# Patient Record
Sex: Female | Born: 1945
Health system: Southern US, Community
[De-identification: ages and names within clinical notes are randomized; demographics above are authoritative.]

## PROBLEM LIST (undated history)

## (undated) DIAGNOSIS — I10 Essential (primary) hypertension: Secondary | ICD-10-CM

## (undated) DIAGNOSIS — G4730 Sleep apnea, unspecified: Secondary | ICD-10-CM

## (undated) DIAGNOSIS — K219 Gastro-esophageal reflux disease without esophagitis: Secondary | ICD-10-CM

## (undated) DIAGNOSIS — T7840XA Allergy, unspecified, initial encounter: Secondary | ICD-10-CM

## (undated) DIAGNOSIS — M1990 Unspecified osteoarthritis, unspecified site: Secondary | ICD-10-CM

## (undated) HISTORY — DX: Unspecified osteoarthritis, unspecified site: M19.90

## (undated) HISTORY — PX: CHOLECYSTECTOMY: SHX55

## (undated) HISTORY — PX: OTHER SURGICAL HISTORY: SHX169

## (undated) HISTORY — DX: Sleep apnea, unspecified: G47.30

## (undated) HISTORY — DX: Gastro-esophageal reflux disease without esophagitis: K21.9

## (undated) HISTORY — DX: Allergy, unspecified, initial encounter: T78.40XA

## (undated) HISTORY — DX: Essential (primary) hypertension: I10

## (undated) HISTORY — PX: ABDOMINAL HYSTERECTOMY: SHX81

---

## 1998-02-27 ENCOUNTER — Ambulatory Visit (HOSPITAL_COMMUNITY): Admission: RE | Admit: 1998-02-27 | Discharge: 1998-02-27 | Payer: Self-pay

## 1998-03-02 ENCOUNTER — Ambulatory Visit (HOSPITAL_COMMUNITY): Admission: RE | Admit: 1998-03-02 | Discharge: 1998-03-02 | Payer: Self-pay | Admitting: Family Medicine

## 1999-02-28 ENCOUNTER — Ambulatory Visit (HOSPITAL_COMMUNITY): Admission: RE | Admit: 1999-02-28 | Discharge: 1999-02-28 | Payer: Self-pay

## 1999-03-01 ENCOUNTER — Encounter: Payer: Self-pay | Admitting: Internal Medicine

## 2003-09-09 ENCOUNTER — Inpatient Hospital Stay (HOSPITAL_COMMUNITY): Admission: EM | Admit: 2003-09-09 | Discharge: 2003-09-12 | Payer: Self-pay | Admitting: Emergency Medicine

## 2003-09-09 ENCOUNTER — Encounter (INDEPENDENT_AMBULATORY_CARE_PROVIDER_SITE_OTHER): Payer: Self-pay | Admitting: Specialist

## 2005-06-11 ENCOUNTER — Other Ambulatory Visit: Admission: RE | Admit: 2005-06-11 | Discharge: 2005-06-11 | Payer: Self-pay | Admitting: Gynecology

## 2005-07-31 ENCOUNTER — Encounter (INDEPENDENT_AMBULATORY_CARE_PROVIDER_SITE_OTHER): Payer: Self-pay | Admitting: Specialist

## 2005-08-01 ENCOUNTER — Inpatient Hospital Stay (HOSPITAL_COMMUNITY): Admission: RE | Admit: 2005-08-01 | Discharge: 2005-08-02 | Payer: Self-pay | Admitting: *Deleted

## 2005-12-18 ENCOUNTER — Ambulatory Visit (HOSPITAL_COMMUNITY): Admission: RE | Admit: 2005-12-18 | Discharge: 2005-12-18 | Payer: Self-pay | Admitting: Family Medicine

## 2006-09-07 ENCOUNTER — Other Ambulatory Visit: Admission: RE | Admit: 2006-09-07 | Discharge: 2006-09-07 | Payer: Self-pay | Admitting: *Deleted

## 2007-04-12 ENCOUNTER — Ambulatory Visit: Payer: Self-pay | Admitting: Cardiology

## 2007-04-12 ENCOUNTER — Emergency Department (HOSPITAL_COMMUNITY): Admission: EM | Admit: 2007-04-12 | Discharge: 2007-04-12 | Payer: Self-pay | Admitting: Emergency Medicine

## 2008-02-24 ENCOUNTER — Other Ambulatory Visit: Admission: RE | Admit: 2008-02-24 | Discharge: 2008-02-24 | Payer: Self-pay | Admitting: Gynecology

## 2010-06-28 ENCOUNTER — Ambulatory Visit (HOSPITAL_COMMUNITY): Admission: RE | Admit: 2010-06-28 | Discharge: 2010-06-28 | Payer: Self-pay | Admitting: Emergency Medicine

## 2011-01-07 NOTE — Consult Note (Signed)
NAMESAGE, HAMMILL NO.:  [id number]   MEDICAL RECORD NO.:  [id number]          PATIENT TYPE:  EMS   LOCATION:  MAJO                         FACILITY:  [facility]   PHYSICIAN:  Rollene Rotunda, MD, FACCDATE OF BIRTH:  August 24, 1946   DATE OF CONSULTATION:  04/12/2007  DATE OF DISCHARGE:                                 CONSULTATION   SUMMARY OF HISTORY:  Miranda [name] is a 65 year old African American female  who is referred to [facility] Emergency Room for cardiac evaluation.  This past Thursday at approximately 1:30 a.m. during the night she  rolled over on her left arm and was awakened with left upper anterior  sharp twinges in her upper left chest and her shoulder.  She sat up and  tried to relax.  The discomfort lasted less than 5 minutes. She did not  have any associated shortness of breath, nausea, vomiting, diaphoresis.  She felt fine throughout Friday and Saturday.  On Thursday, the initial  discomfort was 5 to 6 on scale of 0 to 10.  Sunday morning when she woke  up around 7:00 a.m. she noticed that she had a hot sweat.  This was  proceeded by a sharp twinge in her left shoulder and left arm that she  gave a 3 on a scale of 0 to 10.  This occurred while she was sitting on  the bed and it lasted less than 1 minute.  She proceeded to have  breakfast, shower and dress for church.  She did not have any further  occurrences but due to the discomfort she decided to go to her primary  care physician.  She was noted to be hypertensive.  She was prescribed  Toprol and sublingual nitroglycerin and was told that she would follow  up with a cardiologist.  Apparently a physician from [address] spoke  with Dr. Diona Browner this morning and Dr. Diona Browner referred the patient to  the emergency room.  Miranda [name] has not had any subsequent occurrences  since yesterday morning. She denies prior episodes or known injury.   PAST MEDICAL HISTORY:  No known drug allergies.   MEDICATIONS:  1.  Lisinopril/hydrochlorothiazide 20/25 mg daily.  2. Lisinopril 20 mg every night.  3. She was recently prescribed metoprolol 50 mg daily and      nitroglycerin 0.4 as needed on April 11, 2007.   Her history is notable for hypertension for at least 6 years.  At home,  her blood pressure ranges from the 130s to the 160s over 50s to 80s.  She has had a total abdominal hysterectomy in December 2006 secondary to  adenocarcinoma.  Lap/cholecystectomy in 2005 and negative breast biopsy  in 1999.  She specifically denied any diabetes, CVA, myocardial  infarction, COPD, bleeding dyscrasias, hyperlipidemia or thyroid  discomfort.   SOCIAL HISTORY:  She resides with her husband in [place]. She has 2  children, 3 grandchildren.  She is a Conservation officer, nature with [employer] and works part-time at [employer]  She denies any history of  tobacco, alcohol, drugs, herbal medications,  specific diet or exercise  program.   FAMILY HISTORY:  Mother deceased at age 50 with a cerebral aneurysm.  Her father died at 56 of old age and history of Alzheimer's.  She had 1  brother that was killed.  She has 3 brothers and sisters living.  One  sister has had a nervous breakdown.   REVIEW OF SYSTEMS:  In addition to the above is notable for sweats,  sinus problems, glasses, chronic dyspnea on exertion since her lap/chole  in 2005, pedal edema which decreases at night, postmenopausal, nocturia,  numbness in her left index finger and rare GERD.   PHYSICAL EXAMINATION:  GENERAL:  Well-developed, well-nourished,  pleasant, obese African American female in no apparent distress.  Temperature is 98.3, blood pressure 135/51, pulse 60 and regular,  respirations 60 and regular, 97% sat on room air.  HEENT:  Unremarkable.  NECK:  Supple without thyromegaly, adenopathy, JVD or carotid bruits.  CHEST:  Symmetrical excursion.  LUNGS:  Clear to auscultation without rales, rhonchi or wheezing.  HEART:  PMI is not  displaced.  Regular rate and rhythm.  I do not  appreciate any murmurs, rubs, clicks or gallops.  All pulses are  symmetrical and intact.  I did not appreciate abdominal or femoral  bruits.  SKIN INTEGUMENT:  Intact.  ABDOMEN:  Obese, bowel sounds present without organomegaly, masses or  tenderness.  EXTREMITIES:  Negative cyanosis, clubbing or edema.  However, she does  have reproducible chest discomfort with range of motion exercises of her  left shoulder and she is slightly tender to the touch in her upper left  anterior chest just below the clavicle.  Otherwise musculoskeletal and  neuro are unremarkable.   Chest x-ray:  No active disease.   EKG:  Shows sinus bradycardia with a ventricular rate of 59, early R-  wave nonspecific ST-T wave changes.  Normal interval. No change from EKG  dated December 2006.   H&H is 12.1 and 37.1, normal indices, platelets 293, WBC 6.3, PTT 33, PT  13.4, CK total MB was 62 and 0.8, troponin 0.1.   IMPRESSION:  1. Left shoulder discomfort reproducible on exam.  No evidence of      cardiac ischemic at this time.  2. Hypertension.  3. Obesity.   DISPOSITION:  Dr. Antoine Poche reviewed the patient's history, spoke with  and examined the patient and agrees with the above.  He did not feel  that her discomfort is cardiac related.  However, given her risk factors  of obesity, postmenopausal and hypertension we will arrange an exercise  treadmill in our office.  This cannot be scheduled at this time and the  office will call her with the appointment.  We have asked her to  continue her metoprolol, however, asked her not to take this on the  morning of the exercise stress test.  Continue a blood pressure diary  and to follow up with her primary care physician.  We will have her  continue the metoprolol for her high blood pressure, however, this will  be held on the morning of the stress test.      Joellyn Rued, PA-C      Rollene Rotunda, MD, [facility]   Electronically Signed    EW/MEDQ  D:  04/12/2007  T:  04/12/2007  Job:  [id number]   cc:   Derenda Mis

## 2011-01-10 NOTE — H&P (Signed)
Miranda [name], WHELESS NO.:  [id number]   MEDICAL RECORD NO.:  [id number]          PATIENT TYPE:   LOCATION:                                 FACILITY:   PHYSICIAN:  Almedia Balls. Fore, [name]        DATE OF BIRTH:   DATE OF ADMISSION:  07/31/2005  DATE OF DISCHARGE:                                HISTORY & PHYSICAL   DATE OF ADMISSION:  July 31, 2005   CHIEF COMPLAINT:  Cancer of the uterus.   HISTORY:  The patient is a 65 year old gravida 2 para 2 whose last menstrual  period was many years ago. She had had some bleeding recently and underwent  hysteroscopy D&C with findings of extensive complex atypical hyperplasia,  endometrial polyps, and focus of well-differentiated endometrioid carcinoma.  Endocervical curettage was negative. She is admitted at this time for  TAH/BSO. She has been fully counseled as to the nature of the procedure and  the risks involved to include risks of anesthesia; injury to bowel, bladder,  blood vessels, ureters; postoperative hemorrhage; infection; recuperation.  She fully understands all these considerations and wishes to proceed on  July 31, 2005.   PAST MEDICAL HISTORY:  Includes breast biopsy in 1999 which was normal.  Gallbladder removal in January 2005. She takes furosemide daily, but has  stopped this recently. She is allergic to no medications.   FAMILY HISTORY:  Include sister with heart disease and brother who has had a  stroke.   REVIEW OF SYSTEMS:  HEENT: Occasional headaches. CARDIORESPIRATORY:  History  of hypertension. GASTROINTESTINAL:  Gallstones. GENITOURINARY:  As in  present illness. NEUROMUSCULAR:  Negative.   PHYSICAL EXAMINATION:  VITAL SIGNS:  Height 5 feet one-fourth inch, weight  240 pounds, blood pressure 150/88, pulse 80, respirations 18.  GENERAL:  Well-developed black female in no acute distress.  HEENT:  Within normal limits.  NECK:  Supple without masses, adenopathy or bruits.  HEART:  Regular  rate and rhythm without murmurs.  LUNGS:  Clear to P&A.  BREASTS:  Examined sitting and lying without mass.  ABDOMEN:  Flat and soft without masses, increased panniculus.  PELVIC:  External genitalia, Bartholin, urethra, and Skene's glands within  normal limits. Cervix is slightly inflamed. Uterus is mid posterior,  approximately 6-[redacted] weeks gestational size, nontender. Adnexa reveal no  palpable mass or tenderness. Rectovaginal exam is confirmatory.  EXTREMITIES:  Within normal limits.  CENTRAL NERVOUS SYSTEM: Grossly intact.  SKIN:  Without suspicious lesions.   IMPRESSION:  Adenocarcinoma of the endometrium, well differentiated,  probable stage I.   DISPOSITION:  As noted above. Of note is that a Pap smear was normal in  October 2006.           ______________________________  Almedia Balls. Randell Patient, [name]     SRF/MEDQ  D:  07/23/2005  T:  07/23/2005  Job:  [id number]

## 2011-01-10 NOTE — Discharge Summary (Signed)
NAMEJAELAH, Miranda [name] NO.:  [id number]   MEDICAL RECORD NO.:  [id number]          PATIENT TYPE:  INP   LOCATION:  1614                         [facility]ACILITY:  [facility]   PHYSICIAN:  Almedia Balls. Fore, [name]   DATE OF BIRTH:  1945-12-14   DATE OF ADMISSION:  07/31/2005  DATE OF DISCHARGE:  08/02/2005                                 DISCHARGE SUMMARY   HISTORY:  The patient is a 65 year old with grade 1, probably stage 1  adenocarcinoma of the endometrium for hysterectomy and bilateral salpingo-  oophorectomy. The remainder of her history and physical are as previously  dictated.   LABORATORY DATA:  Includes preoperative hemoglobin of 12.8. BMET panel  normal except for glucose slightly elevated at 113. Electrocautery showed  nonspecific T-wave abnormality and possible ventricular hypertrophy with a  normal sinus rhythm. Chest x-ray had been normal recently.   HOSPITAL COURSE:  The patient was taken to the operating room on July 31, 2005 at which time abdominal hysterectomy and bilateral salpingo-  oophorectomy, over sewing of superficial small intestinal burn were  performed. The patient did well postoperatively. Diet and ambulation were  progressed over several days postoperatively. On the first postoperative  day, she was experiencing some nausea and vomiting, but this gradually  cleared. On the morning of December 9, she was afebrile, passing flatus, and  experiencing no nausea. Bowel sounds were active at this point, and it was  felt that she could be discharged at this time.   FINAL DIAGNOSIS:  Adenocarcinoma of the endometrium, grade 1, probable stage  1.   OPERATION:  Abdominal hysterectomy, bilateral salpingo-oophorectomy, over  sewing of superficial burn of small intestine. Pathology report unavailable  at the time of dictation.   DISPOSITION:  Discharged home to return to the office in two weeks for  followup. She was instructed to gradually progress her  activities over  several weeks and to limit lifting and driving for two weeks. She was fully  ambulatory, on a regular diet and in good condition at the time of  discharge. She was given prescriptions for Percocet 10/325 #30, doxycycline  100 mg #12 to be taken 1 b.i.d. and Reglan 10 mg generic #30 to be taken 1  t.i.d. She will call for any problems.           ______________________________  Almedia Balls Miranda [name] Patient, [name]     SRF/MEDQ  D:  08/02/2005  T:  08/02/2005  Job:  [id number]

## 2011-01-10 NOTE — Op Note (Signed)
NAMEMARIXA, Miranda [name] NO.:  [id number]   MEDICAL RECORD NO.:  [id number]          PATIENT TYPE:  AMB   LOCATION:  DAY                          FACILITY:  [facility]   PHYSICIAN:  Almedia Balls. Fore, [name]   DATE OF BIRTH:  03/18/1946   DATE OF PROCEDURE:  07/31/2005  DATE OF DISCHARGE:                                 OPERATIVE REPORT   PREOPERATIVE DIAGNOSIS:  Grade 1 stage I adenocarcinoma of the endometrium.   POSTOPERATIVE DIAGNOSIS:  Grade 1 stage I adenocarcinoma of the endometrium,  pending pathology.   OPERATION:  TAH-BSO.   ANESTHESIA:  General orotracheal.   OPERATOR:  Dr. Randell Patient   FIRST ASSISTANT:  Dr. Laureen Ochs   INDICATIONS FOR SURGERY:  The patient is a 65 year old with the above-noted  problem for TAH-BSO.  She has been fully counseled as to the nature of the  procedure and the risks involved to include risks of anesthesia, injury to  bowel, bladder blood vessels, ureters, postoperative hemorrhage, infection,  recuperation.  She fully understands all these considerations and wishes to  proceed on 31 July 2005 and has signed informed consent to proceed on  this date as well.   OPERATIVE FINDINGS:  On entry into the peritoneal cavity, exploration of the  upper abdomen revealed the lower liver edge, gallbladder, spleen, kidneys,  periaortic areas to be normal to palpation.  Appendiceal area was normal to  visualization.  In the pelvis, the uterus was mid posterior, normal size,  shape and contour.  Both ovaries were atrophic and without signs of any  peritoneal lesions.   PROCEDURE:  With the patient under general anesthesia, prepared and draped  in the usual sterile fashion, with a Foley catheter in the bladder, a lower  abdominal transverse incision was made and carried into the peritoneal  cavity without difficulty.  At the lower portion of the incision, the Bovie  electrocoagulation unit was then used for hemostasis, and the patient had  protrusion  of a loop of bowel which sustained a very minimal burn from the  electrocoagulation unit.  This was observed for a short period of time, and  it was noted that there was no hemorrhage or significant change in this  area.  It was felt that this could be observed and sutured later because it  was a serosa.  A self-retaining retractor was placed, and the bowel was  packed off.  Kelly clamps were used to clamp the utero-ovarian anastomoses,  tubes, and round ligaments bilaterally for traction and hemostasis.  The  round ligaments bilaterally were transected using Bovie electrocoagulation  with development of bladder flap anteriorly and entry into the  retroperitoneal space.  The infundibulopelvic ligaments bilaterally were  identified, isolated, clamped, cut, and doubly ligated well above the  ureters.  Uterine vessels bilaterally were then skeletonized, clamped, cut,  and suture ligated with 1 chromic catgut.  Cardinal ligaments and  uterosacral ligament areas were then successively clamped with Heaney clamps  bilaterally, cut free, and suture ligated with 1 chromic catgut.  It was  then possible to remove  the uterus, ovaries, and cervix as a single specimen  by utilizing Bovie electrocoagulation at the vaginal cuff.  Vaginal cuff was  rendered hemostatic and reapproximated with interrupted sutures of 1 chromic  catgut.  The area was observed for hemostasis, and after noting that all  hemostasis was maintained and that all areas were normal, the area in the  bowel which had been injured previously by the Bovie electrocoagulation unit  was isolated and sutured with a horizontal mattress suture through the  serosa which provided good closure.  This area appeared normal for the most  part, but there was evidence of some slight burn.  During the search for  this area on the bowel, the bowel mesenteric vessel was lacerated; this was  suture ligated with interrupted suture of 3-0 silk as was the area  on the  bowel.  This provided hemostasis in the mesentery.  At this point with good  hemostasis throughout and correct sponge and instrument count, the  peritoneum was closed with a continuous suture of 0 Vicryl.  Fascia was  closed with two sutures of 0 Vicryl which were brought from the lateral  aspects of the incision and tied separately in the midline.  The  subcutaneous fat was reapproximated with interrupted horizontal mattress  sutures of 0 Vicryl.  Skin was closed with a subcuticular suture of 3-0  plain catgut.  Estimated blood loss 200 mL.  The patient was taken to the  recovery room in good condition with clear urine in the Foley catheter  tubing.  She will be placed on 23-hour observation following surgery.           ______________________________  Almedia Balls Randell Patient, [name]     SRF/MEDQ  D:  07/31/2005  T:  07/31/2005  Job:  [id number]   cc:   Leona Singleton, [name]  Fax: [phone]

## 2011-01-10 NOTE — Discharge Summary (Signed)
NAMEAVONNA, [name] NO.:  [id number]   MEDICAL RECORD NO.:  [id number]          PATIENT TYPE:  INP   LOCATION:  1614                         FACILITY:  [facility]   PHYSICIAN:  Almedia Balls. Fore, [name]   DATE OF BIRTH:  Oct 28, 1945   DATE OF ADMISSION:  07/31/2005  DATE OF DISCHARGE:  08/02/2005                                 DISCHARGE SUMMARY   ADDENDUM:  The patient was informed of the superficial burn to her small  intestine and to the fact that it required a small suture to reinforce this  area.  She fully understands this situation and will be observant of any  problem with GI function.           ______________________________  Almedia Balls Randell Patient, [name]     SRF/MEDQ  D:  08/02/2005  T:  08/02/2005  Job:  [id number]

## 2011-01-10 NOTE — Op Note (Signed)
Miranda [name], Miranda [name]                            ACCOUNT NO.:  [id number]   MEDICAL RECORD NO.:  [id number]                   PATIENT TYPE:  INP   LOCATION:  6045                                 FACILITY:  [facility]   PHYSICIAN:  [name], [name]                DATE OF BIRTH:  1946/06/28   DATE OF PROCEDURE:  09/10/2003  DATE OF DISCHARGE:                                 OPERATIVE REPORT   PREOPERATIVE DIAGNOSES:  1. Subacute cholecystitis.  2. Cholelithiasis, rule out choledocholithiasis.   POSTOPERATIVE DIAGNOSES:  1. Subacute cholecystitis.  2. Cholelithiasis.   PROCEDURE:  Laparoscopic cholecystectomy.   SURGEON:  [name], [name]   ASSISTANT:  Lebron Conners, [name]   ANESTHESIA:  General per Dr. Shireen Quan.   ESTIMATED BLOOD LOSS:  Minimal.   PREPARATION:  Betadine.   COMPLICATIONS:  None.   DRAINS:  #19 [place] Blake drain to right upper quadrant, placed to bulb  suction.   INDICATIONS:  The patient is a 65 year old black female, presented to the  emergency department with episode of severe right upper quadrant abdominal  pain, radiating to the back.  This was her third episode in 6 weeks.  The  patient was relieved with narcotic analgesics.  Ultrasound demonstrated  multiple gallstones and probable common bile duct stone.  The patient was  seen and evaluated by gastroenterology.  She was admitted on the  gastroenterology service.  General surgery was consulted.  Liver function  tests remain normal, and pain resolved.  Therefore, the patient was prepared  for the operating room.   DESCRIPTION OF PROCEDURE:  The procedure is done in OR #1 at the [facility].  The patient is brought to the operating room, placed in  a supine position on the operating room table.  Following administration of  general anesthesia, the patient is prepped and draped in the usual strict  aseptic fashion.  After ascertaining that an adequate level of anesthesia  had  been obtained, an infraumbilical incision is made with a #15 blade.  Dissection is carried down through the subcutaneous tissues.  Fascia is  incised in the midline, and the peritoneal cavity is entered.  A 0 Vicryl  pursestring suture is placed in the fascia.  An Hasson cannula is introduced  under direct vision and secured with the pursestring suture.  The abdomen is  insufflated with carbon dioxide.  Laparoscope is introduced under direct  vision and the abdomen explored.  There is a tense, distended gallbladder in  the right upper quadrant.  There are omental adhesions adherent to the  undersurface of the gallbladder.  Operative ports are placed along the right  costal margin in the midline, midclavicular line, and anterior axillary  line.  Fundus of the gallbladder is punctured with an aspirating trocar, and  clear bile is aspirated from the gallbladder.  Gallbladder is then grasped  and retracted cephalad.  Omental adhesions are taken down gently with blunt  dissection and hemostasis obtained with the electrocautery.  Dissection is  carried down to the neck of the gallbladder.  Gallbladder is quite large and  quite long.  The neck of the gallbladder is folded at approximately a 90-  degree angle into the porta hepatis.  The infundibulum of the gallbladder is  quite extensive.  There are adherent lymph nodes which are moderately  enlarged due to inflammation.  There is edema in the tissues.  There is  induration in the tissues.  Remaining on the wall of the gallbladder, a  careful dissection is performed.  Structures are divided between medium  Ligaclips.  Dissection is carried down to the end of the infundibulum, but a  cystic duct is not truly identified.  There is no evidence of bile leak.  There is no evidence of common bile duct injury.  Vascular structures are  controlled with Ligaclips.  Gallbladder is carefully excised from the  gallbladder bed using the hook electrocautery for  hemostasis.  Again, no  obvious evidence of cystic duct is encountered.  Therefore, a cholangiogram  is not performed.  However, there is no evidence of bile leak or common bile  duct injury.  Gallbladder is excised from the gallbladder bed using the hook  electrocautery for hemostasis.  Gallbladder is placed into an EndoCatch bag  and withdrawn from the peritoneal cavity through the umbilical port.  Due to  failure to positively control the cystic duct, a decision is made to place a  19 [place] Blake drain beneath the liver.  This brought in and exited through  the lateral port on the right side of the abdominal wall.  Drain is placed  beneath the liver and near the porta hepatis.  The drain is secured to the  skin with a 3-0 nylon suture.  Pneumoperitoneum is released, and remaining  drains are removed after aspirating all fluid from the right upper quadrant.  Good hemostasis is noted at all port sites.  Port sites are anesthetized  with local anesthetic.  The 0 Vicryl pursestring suture is tied securely.  Wounds are closed with interrupted 4-0 Vicryl subcuticular sutures.  Wounds  are washed and dried, and Benzoin and Steri-Strips are applied.  Sterile  gauze dressings are applied.  Drain is placed to bulb suction.  The patient  is awakened from anesthesia and brought to the recovery room in stable  condition.  The patient tolerated the procedure well.                                               [name], [name]    TMG/MEDQ  D:  09/10/2003  T:  09/10/2003  Job:  [id number]   cc:   Fayrene Fearing L. Malon Kindle., [name]  [address]  [place]  [place] [address]  Fax: [phone]   [facility]

## 2011-01-10 NOTE — H&P (Signed)
Miranda [name], Miranda [name]                            ACCOUNT NO.:  [id number]   MEDICAL RECORD NO.:  [id number]                   PATIENT TYPE:  EMS   LOCATION:  ED                                   FACILITY:  [facility]   PHYSICIAN:  James L. Malon Kindle., [name]          DATE OF BIRTH:  1945-09-27   DATE OF ADMISSION:  09/09/2003  DATE OF DISCHARGE:                                HISTORY & PHYSICAL   REASON FOR ADMISSION:  Biliary colic with possible common bile duct stone.   HISTORY:  A pleasant 65 year old African-American female, who has had three  distinct attacks of probable biliary colic over the past 6 weeks or so.  Severe attack started this morning, had her doubled over, right upper  quadrant going through to her back.  She did come to the emergency room  after being seen at [facility].  The feeling was that she had biliary colic.  The patient notes that the pain was severe.  She was given some Stadol and  Phenergan, clinically feels much better now.  She had some right-sided  abdominal pain and right flank tenderness.  The patient had an ultrasound  done that showed a gallbladder full of stones with probable common bile duct  calcification seen on multiple pictures.  Her liver tests, however, were  completely normal with normal total bilirubin and alk phos, normal  transaminases.   CURRENT MEDICATIONS:  Maxzide.   ALLERGIES:  She has no drug allergies.   MEDICAL HISTORY:  1. She has a history of fluid retention treated with Maxzide.  2. Some mild hypertension.  3. Only previous surgery is a breast biopsy.   FAMILY HISTORY:  Noncontributory, is negative for gallstones.   PHYSICAL EXAMINATION:  VITAL SIGNS:  Temperature 97.6, blood pressure  134/71.  GENERAL:  An obese black female in no acute distress.  EYES:  Sclerae nonicteric.  NECK:  Supple, no lymphadenopathy.  LUNGS:  Clear.  HEART:  Regular rate and rhythm with murmurs or gallops.  ABDOMEN:  Soft, nontender,  nondistended with good bowel sounds.   ASSESSMENT:  Gallstones with probable common bile duct stone.  I have  reviewed the ultrasound with Dr. Audie Pinto.  It does really appear to be a  common duct stone.  It is a bit unusual why her liver tests are normal.  I  wonder if the severe attack of pain today was actually passage of the stone  into the common duct, and she has not had time to bump upper limits yet.   PLAN:  1. We will admit.  2. Give clear liquids.  3. Check liver tests in the morning and make a decision in conjunction with     the surgeons regarding whether to proceed with ERCP or laparoscopic     cholecystectomy first.  James L. Malon Kindle., [name]    Waldron Session  D:  09/09/2003  T:  09/09/2003  Job:  [id number]   cc:   [facility].   [name], [name]  [address] [place]  [place] [address]  Fax: [phone]

## 2011-01-10 NOTE — Discharge Summary (Signed)
Miranda [name], Miranda [name]                            ACCOUNT NO.:  [id number]   MEDICAL RECORD NO.:  [id number]                   PATIENT TYPE:  INP   LOCATION:  1610                                 FACILITY:  [facility]   PHYSICIAN:  [name], [name]                DATE OF BIRTH:  [birth year]   DATE OF ADMISSION:  09/09/2003  DATE OF DISCHARGE:  09/12/2003                                 DISCHARGE SUMMARY   REASON FOR ADMISSION:  Biliary colic, rule out common bile duct stone.   BRIEF HISTORY:  The patient is a 65 year old African-American female with  three distinct episodes of biliary colic over the past six weeks prior to  admission.  She was seen at [facility] and then referred to [facility]  emergency department.  She was seen by the emergency room physicians.  Ultrasound demonstrated gallbladder with gallstones and a probable common  bile duct stone.  The patient was seen by [name], [name], and  admitted.  She was seen in consultation by general surgery.  The patient's  liver function tests remained normal.  Therefore, she was prepared and taken  to the operating room on September 10, 2003, where she underwent laparoscopic  cholecystectomy for subacute cholecystitis and cholelithiasis.  Post  procedure she had a Jackson-Pratt drain remain in place.  She received  intravenous antibiotics.  Liver function tests were repeated.  The patient  was prepared for discharge home on the second postoperative day.   DISCHARGE PLANNING:  The patient was discharged home on September 12, 2003, in  good condition, tolerating a regular diet, and ambulating independently.  The patient will be seen back in my office at [facility] in  three to five days for a wound check and drain removal.   DISCHARGE MEDICATIONS:  Vicodin as needed for pain.   FINAL DIAGNOSIS:  Chronic cholecystitis, cholelithiasis.   CONDITION ON DISCHARGE:  Improved.     [name], [name]    TMG/MEDQ  D:  09/27/2003  T:  09/27/2003  Job:  [id number]   cc:   Fayrene Fearing L. Malon Kindle., [name]  [address]  [place]  [place] [address]  Fax: [phone]

## 2011-01-10 NOTE — Consult Note (Signed)
Miranda [name], Miranda [name]                            ACCOUNT NO.:  [id number]   MEDICAL RECORD NO.:  [id number]                   PATIENT TYPE:  INP   LOCATION:  1610                                 FACILITY:  [facility]   PHYSICIAN:  [name], [name]                DATE OF BIRTH:  1946-04-05   DATE OF CONSULTATION:  09/10/2003  DATE OF DISCHARGE:                                   CONSULTATION   REFERRING PHYSICIAN:  Llana Aliment. Randa [name], [name]   REASON FOR CONSULTATION:  Cholelithiasis, subacute cholecystitis, rule out  common bile duct stone.   HISTORY OF PRESENT ILLNESS:  The patient is a 65 year old black female  admitted from the emergency department at [facility] by  Fayrene Fearing Miranda [name], [name], on September 09, 2003, with abdominal pain.  The  patient has had three episodes of right upper quadrant abdominal pain over  the past six weeks.  This was the most severe.  It started early on the  morning of September 09, 2003.  It was right upper quadrant abdominal pain  radiating to the back.  She presented for evaluation at [facility].  She was  treated with Stadol and Phenergan with mild symptomatic relief.  She was  transferred by ambulance to [facility] for evaluation.  In the  emergency department, ultrasound of the abdomen was obtained, which showed  gallstones and probable common bile duct stones as well.  The patient was  seen by Dr. Randa [name] and admitted on the gastroenterology service.  General  surgery is now consulted.   PAST MEDICAL HISTORY:  Hypertension.   MEDICATIONS:  Maxzide.   ALLERGIES:  None known.   SOCIAL HISTORY:  [place]  She uses  [facility] as her primary care physician.   FAMILY HISTORY:  Noncontributory.   REVIEW OF SYSTEMS:  The 15-system review was without significant other  positive, except as noted above.  The patient denies any history of jaundice  of acholic stools.  Denies any history of  fevers or chills.  She had not had  any symptoms prior to the past six weeks.   PHYSICAL EXAMINATION:  GENERAL APPEARANCE:  A 66 year old, moderately obese,  black female on [address] [facility].  VITAL SIGNS:  Temperature 97.6 degrees, blood pressure 134/71, pulse 62, O2  saturation 98% on room air.  HEENT:  Normocephalic and atraumatic.  The sclerae are clear.  The  conjunctivae are clear.  Pupils equal, round, and reactive to light.  Dentition is fair.  Mucous membranes are moist.  Voice quality is normal.  NECK:  Supple without mass.  The thyroid is normal without nodularity.  There is no anterior or posterior cervical lymphadenopathy.  LUNGS:  Clear to auscultation bilaterally without rales or rhonchi.  CARDIAC:  Regular rate and rhythm without murmur.  ABDOMEN:  Soft without distention.  Bowel sounds are present.  There are no  surgical wounds.  There is no hepatosplenomegaly.  There are no palpable  masses.  There is only mild tenderness in the right upper quadrant to deep  palpation.  There is no sign of hernia.  EXTREMITIES:  Nontender without edema.  NEUROLOGIC:  The patient is alert and oriented without focal deficit.   LABORATORY STUDIES:  White blood cell count normal, hemoglobin normal.  Electrolytes normal.  Liver function tests within normal limits.   The EKG shows sinus bradycardia without acute change.   The chest x-ray shows no active disease.   IMPRESSION:  1. Subacute cholecystitis.  2. Cholelithiasis.  3. Rule out choledocholithiasis.   RECOMMENDATIONS:  1. To operating room for laparoscopic cholecystectomy with cholangiography     if possible.  2. Routine postoperative care.  3. ERCP if common bile duct stone is identified.   I discussed at length with Ms. Courser the indications for surgery.  Given that  her liver function tests remain normal from admission and on the morning  following admission, despite radiographic reports of  common bile duct  stones, I feel it is unlikely she has choledocholithiasis.  Also, the  patient has remained pain-free overnight.  I discussed with her the  indications for cholecystectomy.  We discussed laparoscopic versus open  technique.  We discussed the risks and benefits of the procedure.  She is  anxious to proceed.                                               [name], [name]    TMG/MEDQ  D:  09/10/2003  T:  09/10/2003  Job:  [id number]

## 2011-06-06 LAB — DIFFERENTIAL
Basophils Absolute: 0
Basophils Relative: 1
Lymphocytes Relative: 26
Neutro Abs: 4
Neutrophils Relative %: 63

## 2011-06-06 LAB — CBC
HCT: 37.1
MCHC: 32.7
Platelets: 293
RDW: 15.8 — ABNORMAL HIGH

## 2011-06-06 LAB — COMPREHENSIVE METABOLIC PANEL
Albumin: 3.4 — ABNORMAL LOW
Alkaline Phosphatase: 116
BUN: 11
Chloride: 104
Creatinine, Ser: 0.8
GFR calc non Af Amer: >60
Glucose, Bld: 96
Total Bilirubin: 0.8

## 2011-06-06 LAB — CK TOTAL AND CKMB (NOT AT ARMC): CK, MB: 0.8

## 2011-07-16 ENCOUNTER — Other Ambulatory Visit: Payer: Self-pay | Admitting: Family Medicine

## 2011-07-16 DIAGNOSIS — Z1231 Encounter for screening mammogram for malignant neoplasm of breast: Secondary | ICD-10-CM

## 2011-08-20 ENCOUNTER — Ambulatory Visit (HOSPITAL_COMMUNITY): Payer: Self-pay | Attending: Family Medicine

## 2011-12-04 ENCOUNTER — Ambulatory Visit (INDEPENDENT_AMBULATORY_CARE_PROVIDER_SITE_OTHER): Payer: BC Managed Care – PPO | Admitting: Emergency Medicine

## 2011-12-04 VITALS — BP 153/89 | HR 65 | Temp 98.0°F | Resp 18 | Ht 60.0 in | Wt 242.6 lb

## 2011-12-04 DIAGNOSIS — R42 Dizziness and giddiness: Secondary | ICD-10-CM

## 2011-12-04 DIAGNOSIS — D649 Anemia, unspecified: Secondary | ICD-10-CM

## 2011-12-04 DIAGNOSIS — M25519 Pain in unspecified shoulder: Secondary | ICD-10-CM

## 2011-12-04 LAB — POCT CBC
Hemoglobin: 11.9 g/dL — AB (ref 12.2–16.2)
Lymph, poc: 2.2 (ref 0.6–3.4)
MCH, POC: 24.6 pg — AB (ref 27–31.2)
MCHC: 32.2 g/dL (ref 31.8–35.4)
MID (cbc): 0.5 (ref 0–0.9)
MPV: 8.6 fL (ref 0–99.8)
POC Granulocyte: 5 (ref 2–6.9)
POC LYMPH PERCENT: 28.3 %L (ref 10–50)
POC MID %: 7.1 %M (ref 0–12)
Platelet Count, POC: 291 10*3/uL (ref 142–424)
RDW, POC: 16.1 %
WBC: 7.7 10*3/uL (ref 4.6–10.2)

## 2011-12-04 LAB — GLUCOSE, POCT (MANUAL RESULT ENTRY): POC Glucose: 106

## 2011-12-04 MED ORDER — MECLIZINE HCL 12.5 MG PO TABS: 32.0000 mg | ORAL_TABLET | Freq: Three times a day (TID) | ORAL | Status: DC | PRN

## 2011-12-04 NOTE — Progress Notes (Signed)
  Subjective:    Patient ID: Miranda [name], female    DOB: 31-Mar-1946, 66 y.o.   MRN: [id number]  HPI patient states that when she awakened yesterday morning she had the onset of what she felt like the room moving. She felt as if she were was falling into a deep hole. This was associated with some nausea. She has not had a headache speech disorder memory issues or any weakness in her arms or legs. She felt a little better through the day yesterday but then this morning awoke and felt similar symptoms although not as bad as they were yesterday.    Review of Systems patient has a history of high blood pressure. She is on medications for this. She has also had pain in the left posterior shoulder which extends over to the left pectoralis area.     Objective:   Physical Exam  Constitutional: She is oriented to person, place, and time. She appears well-developed.  HENT:  Head: Normocephalic.  Right Ear: External ear normal.  Left Ear: External ear normal.  Eyes: Pupils are equal, round, and reactive to light.       There is no nystagmus noted on lateral gaze  Cardiovascular: Normal rate.  Exam reveals gallop. Exam reveals no friction rub.   No murmur heard. Pulmonary/Chest: No respiratory distress. She has no wheezes. She has no rales. She exhibits no tenderness.  Abdominal: Soft. There is no tenderness.  Musculoskeletal:       There is tenderness over the left pectoralis muscle.  Neurological: She is alert and oriented to person, place, and time. She has normal reflexes. She displays normal reflexes. No cranial nerve deficit. She exhibits normal muscle tone. Coordination normal.  Skin: Skin is warm and dry.          Assessment & Plan:   Her symptoms are most consistent with inner ear. We'll check CBC, cmet,glucose. She has tenderness over the pectoralis muscle consistent with a muscle strain. Her hemoglobin is borderline low and microcytic however it has been low in the past he last had a  colonoscopy in 2002. We'll add iron studies to be sure we are not dealing with an iron deficiency anemia

## 2011-12-05 LAB — COMPREHENSIVE METABOLIC PANEL
ALT: 11 U/L (ref 0–35)
AST: 16 U/L (ref 0–37)
Albumin: 4 g/dL (ref 3.5–5.2)
Alkaline Phosphatase: 103 U/L (ref 39–117)
BUN: 15 mg/dL (ref 6–23)
CO2: 28 mEq/L (ref 19–32)
Calcium: 9.6 mg/dL (ref 8.4–10.5)
Chloride: 102 mEq/L (ref 96–112)
Creat: 0.95 mg/dL (ref 0.50–1.10)
Glucose, Bld: 100 mg/dL — ABNORMAL HIGH (ref 70–99)
Potassium: 4 mEq/L (ref 3.5–5.3)
Sodium: 140 mEq/L (ref 135–145)
Total Bilirubin: 0.4 mg/dL (ref 0.3–1.2)
Total Protein: 7.3 g/dL (ref 6.0–8.3)

## 2011-12-05 LAB — IBC PANEL
%SAT: 25 % (ref 20–55)
TIBC: 253 ug/dL (ref 250–470)
UIBC: 189 ug/dL (ref 125–400)

## 2011-12-05 LAB — IRON: Iron: 64 ug/dL (ref 42–145)

## 2012-01-17 ENCOUNTER — Ambulatory Visit (INDEPENDENT_AMBULATORY_CARE_PROVIDER_SITE_OTHER): Payer: BC Managed Care – PPO | Admitting: Family Medicine

## 2012-01-17 VITALS — BP 142/82 | HR 66 | Temp 97.8°F | Resp 16 | Ht 60.75 in | Wt 243.0 lb

## 2012-01-17 DIAGNOSIS — E669 Obesity, unspecified: Secondary | ICD-10-CM

## 2012-01-17 DIAGNOSIS — I10 Essential (primary) hypertension: Secondary | ICD-10-CM

## 2012-01-17 MED ORDER — ATENOLOL-CHLORTHALIDONE 50-25 MG PO TABS: 1.0000 | ORAL_TABLET | Freq: Every day | ORAL | Status: DC

## 2012-01-17 MED ORDER — LISINOPRIL 40 MG PO TABS: 40.0000 mg | ORAL_TABLET | Freq: Every day | ORAL | Status: DC

## 2012-01-17 NOTE — Progress Notes (Signed)
Subjective: 66 year old lady with history of high blood pressure. Takes her medications regularly. He is here for a refill of the medications. She has no major acute complaints. She works in a [employer]. She gets walking regularly for exercise. Her weight stays approximately the same, fluctuating 5 or 10 pounds one way or the other. She's tried eating last not succeeded in losing, stressed with her the importance of continued to cut back on reading taken to she does see the weight coming off. She does try to avoid excessive salt intake.  Denies any chest pain or breathing problems. No GI or GU complaints. She does get a little bit of swelling in her legs during hot weather, but it is not a constant problem. No more episodes of the room moving sensation that she had a couple of weeks ago when she saw Dr. Cleta Alberts.  Objective: Pleasant alert lady in no acute distress. Neck supple no carotid bruits. Chest clear. Heart regular without murmurs. No ankle edema. Filed Vitals:   01/17/12 1506  BP: 142/82  Pulse: 66  Temp: 97.8 F (36.6 C)  Resp: 16   Assessment: Hypertension Overweigh Intermittent edema  Plan: Refill

## 2012-01-17 NOTE — Patient Instructions (Signed)

## 2012-07-12 ENCOUNTER — Other Ambulatory Visit: Payer: Self-pay | Admitting: Family Medicine

## 2012-07-12 ENCOUNTER — Ambulatory Visit (INDEPENDENT_AMBULATORY_CARE_PROVIDER_SITE_OTHER): Payer: BC Managed Care – PPO | Admitting: Family Medicine

## 2012-07-12 VITALS — BP 132/86 | HR 62 | Temp 97.5°F | Resp 18 | Ht 60.5 in | Wt 242.0 lb

## 2012-07-12 DIAGNOSIS — I10 Essential (primary) hypertension: Secondary | ICD-10-CM

## 2012-07-12 DIAGNOSIS — M25519 Pain in unspecified shoulder: Secondary | ICD-10-CM

## 2012-07-12 DIAGNOSIS — M25512 Pain in left shoulder: Secondary | ICD-10-CM

## 2012-07-12 MED ORDER — ATENOLOL-CHLORTHALIDONE 50-25 MG PO TABS: 1.0000 | ORAL_TABLET | Freq: Every day | ORAL | Status: DC

## 2012-07-12 MED ORDER — LISINOPRIL 40 MG PO TABS: 40.0000 mg | ORAL_TABLET | Freq: Every day | ORAL | Status: DC

## 2012-07-12 MED ORDER — OXAPROZIN 600 MG PO TABS: ORAL_TABLET | ORAL | Status: DC

## 2012-07-12 NOTE — Patient Instructions (Signed)
Continue current blood pressure medicine  Take the medicine for the shoulder one twice daily with food. If it upsets your stomach stopped taking it. If you're not doing better over the next couple of weeks come back for further assessment.

## 2012-07-12 NOTE — Progress Notes (Signed)
Subjective: Patient's been having problems with pain in her left shoulder intermittently. Recently has been bad, hurting in the left upper back and anterior part of the shoulder. Knows of no injury. She works at a [employer]. She has not had any treatment for it. She also needs a refill of her blood pressure medicine. Has no problems with that.  Objective: Overweight Afro-American lady, pleasant. Chest clear. Heart regular without murmurs. She's very tender in the anterior aspect of the left shoulder the, a.c. joint region. Additionally she has tenderness just superior to and medial to the left scapula.  Assessment: Shoulder pain and strain Hypertension   Plan: Refill her blood pressure medicines Daypro 600 twice a day  If she's not doing better she is to return and might consider injection of the a.c. joint area. Will probably need x-rays.

## 2012-09-11 ENCOUNTER — Other Ambulatory Visit: Payer: Self-pay | Admitting: Family Medicine

## 2012-09-21 ENCOUNTER — Telehealth: Payer: Self-pay

## 2012-09-21 NOTE — Telephone Encounter (Signed)
[facility] CALLED TO SAY THEY NEED A CALL BACK TO SEE IF WE WOULD REFILL PT'S LISINOPRIL. PLEASE CALL [phone]

## 2012-09-21 NOTE — Telephone Encounter (Signed)
Patient's pharmacy calling from [facility] saying that someone from our office faxed a fax saying we do not accept rx refill requests through fax anymore? (I haven't heard anything about this) pharmacy wants to speak to someone about this: name is Miranda [name] number: [phone]. They want to get a verbal on the refill requests.

## 2012-09-22 NOTE — Telephone Encounter (Signed)
Pharmacy states that they never got rx from November.  Gave verbal.

## 2012-10-24 ENCOUNTER — Other Ambulatory Visit: Payer: Self-pay | Admitting: Family Medicine

## 2013-01-07 ENCOUNTER — Ambulatory Visit (INDEPENDENT_AMBULATORY_CARE_PROVIDER_SITE_OTHER): Payer: BC Managed Care – PPO | Admitting: Emergency Medicine

## 2013-01-07 VITALS — BP 110/60 | HR 65 | Temp 98.2°F | Resp 16 | Ht 61.0 in | Wt 243.4 lb

## 2013-01-07 DIAGNOSIS — Z760 Encounter for issue of repeat prescription: Secondary | ICD-10-CM

## 2013-01-07 DIAGNOSIS — Z1211 Encounter for screening for malignant neoplasm of colon: Secondary | ICD-10-CM

## 2013-01-07 DIAGNOSIS — D649 Anemia, unspecified: Secondary | ICD-10-CM

## 2013-01-07 DIAGNOSIS — I10 Essential (primary) hypertension: Secondary | ICD-10-CM

## 2013-01-07 DIAGNOSIS — E785 Hyperlipidemia, unspecified: Secondary | ICD-10-CM

## 2013-01-07 LAB — COMPREHENSIVE METABOLIC PANEL
Albumin: 3.8 g/dL (ref 3.5–5.2)
BUN: 21 mg/dL (ref 6–23)
Calcium: 9.9 mg/dL (ref 8.4–10.5)
Chloride: 103 mEq/L (ref 96–112)
Glucose, Bld: 99 mg/dL (ref 70–99)
Potassium: 4.6 mEq/L (ref 3.5–5.3)

## 2013-01-07 LAB — POCT CBC
Granulocyte percent: 65.8 %G (ref 37–80)
HCT, POC: 36 % — AB (ref 37.7–47.9)
MCH, POC: 24.3 pg — AB (ref 27–31.2)
MCV: 80.2 fL (ref 80–97)
POC LYMPH PERCENT: 28.1 %L (ref 10–50)
RBC: 4.49 M/uL (ref 4.04–5.48)
WBC: 6.1 10*3/uL (ref 4.6–10.2)

## 2013-01-07 LAB — LIPID PANEL
Cholesterol: 185 mg/dL (ref 0–200)
HDL: 58 mg/dL (ref >39)
Triglycerides: 84 mg/dL (ref <150)

## 2013-01-07 LAB — IRON: Iron: 66 ug/dL (ref 42–145)

## 2013-01-07 LAB — IBC PANEL: %SAT: 26 % (ref 20–55)

## 2013-01-07 MED ORDER — ATENOLOL-CHLORTHALIDONE 50-25 MG PO TABS: 1.0000 | ORAL_TABLET | Freq: Every day | ORAL | Status: DC

## 2013-01-07 MED ORDER — LISINOPRIL 40 MG PO TABS: 40.0000 mg | ORAL_TABLET | Freq: Every day | ORAL | Status: DC

## 2013-01-07 NOTE — Progress Notes (Signed)
  Subjective:    Patient ID: Miranda [name], female    DOB: [birth year], 67 y.o.   MRN: [id number]  HPI 66 year old female here for medication refills: ATENOLOL-CHLORTHALIDPNE 50/25mg  1 qd LISINOPRIL 40mg  1 qd  No lapse, taking on a regular basis 128/76 bp in room  Review of Systems     Objective:   Physical Exam HEENT exam negative neck is supple. Chest is clear to auscultation and percussion. Heart regular and no murmurs rubs or gallops and is obese. Extremities without edema  Results for orders placed in visit on 01/07/13  POCT CBC      Result Value Range   WBC 6.1  4.6 - 10.2 K/uL   Lymph, poc 1.7  0.6 - 3.4   POC LYMPH PERCENT 28.1  10 - 50 %L   MID (cbc) 0.4  0 - 0.9   POC MID % 6.1  0 - 12 %M   POC Granulocyte 4.0  2 - 6.9   Granulocyte percent 65.8  37 - 80 %G   RBC 4.49  4.04 - 5.48 M/uL   Hemoglobin 10.9 (*) 12.2 - 16.2 g/dL   HCT, POC 40.9 (*) 81.1 - 47.9 %   MCV 80.2  80 - 97 fL   MCH, POC 24.3 (*) 27 - 31.2 pg   MCHC 30.3 (*) 31.8 - 35.4 g/dL   RDW, POC 91.4     Platelet Count, POC 237  142 - 424 K/uL   MPV 8.7  0 - 99.8 fL  POCT GLYCOSYLATED HEMOGLOBIN (HGB A1C)      Result Value Range   Hemoglobin A1C 5.8          Assessment & Plan:  We'll go ahead and check iron studies. She will schedule her own mammogram. Referral made to GI for evaluation .

## 2013-01-11 ENCOUNTER — Other Ambulatory Visit: Payer: Self-pay | Admitting: Emergency Medicine

## 2013-01-11 DIAGNOSIS — Z1231 Encounter for screening mammogram for malignant neoplasm of breast: Secondary | ICD-10-CM

## 2013-01-12 ENCOUNTER — Ambulatory Visit (HOSPITAL_COMMUNITY)
Admission: RE | Admit: 2013-01-12 | Discharge: 2013-01-12 | Disposition: A | Discharge status: Home / Self Care | Payer: BC Managed Care – PPO | Source: Ambulatory Visit | Attending: Emergency Medicine | Admitting: Emergency Medicine

## 2013-01-12 DIAGNOSIS — Z1231 Encounter for screening mammogram for malignant neoplasm of breast: Secondary | ICD-10-CM | POA: Insufficient documentation

## 2013-01-12 LAB — IFOBT (OCCULT BLOOD): IFOBT: NEGATIVE

## 2013-01-14 ENCOUNTER — Encounter: Payer: Self-pay | Admitting: Internal Medicine

## 2013-01-19 ENCOUNTER — Encounter: Payer: Self-pay | Admitting: Internal Medicine

## 2013-03-09 ENCOUNTER — Ambulatory Visit (AMBULATORY_SURGERY_CENTER): Payer: Medicare Other | Admitting: *Deleted

## 2013-03-09 VITALS — Ht 60.5 in | Wt 241.4 lb

## 2013-03-09 DIAGNOSIS — Z1211 Encounter for screening for malignant neoplasm of colon: Secondary | ICD-10-CM

## 2013-03-09 MED ORDER — MOVIPREP 100 G PO SOLR: ORAL | Status: DC

## 2013-03-09 NOTE — Progress Notes (Signed)
No egg or soy allergy 

## 2013-03-23 ENCOUNTER — Encounter: Payer: Self-pay | Admitting: Internal Medicine

## 2013-03-23 ENCOUNTER — Ambulatory Visit (AMBULATORY_SURGERY_CENTER): Payer: Medicare Other | Admitting: Internal Medicine

## 2013-03-23 VITALS — BP 94/34 | HR 67 | Temp 96.4°F | Resp 18 | Ht 60.5 in | Wt 241.0 lb

## 2013-03-23 DIAGNOSIS — Z1211 Encounter for screening for malignant neoplasm of colon: Secondary | ICD-10-CM

## 2013-03-23 DIAGNOSIS — D126 Benign neoplasm of colon, unspecified: Secondary | ICD-10-CM

## 2013-03-23 MED ORDER — SODIUM CHLORIDE 0.9 % IV SOLN: 500.0000 mL | INTRAVENOUS | Status: DC

## 2013-03-23 NOTE — Patient Instructions (Addendum)

## 2013-03-23 NOTE — Progress Notes (Signed)
Patient did not experience any of the following events: a burn prior to discharge; a fall within the facility; wrong site/side/patient/procedure/implant event; or a hospital transfer or hospital admission upon discharge from the facility. (G8907) Patient did not have preoperative order for IV antibiotic SSI prophylaxis. (G8918)  

## 2013-03-23 NOTE — Op Note (Signed)
[facility] [address] [place], [address]   COLONOSCOPY PROCEDURE REPORT  PATIENT: Miranda [name], Miranda [name]  MR#: [id number] BIRTHDATE: Dec 29, 1945 , 67  yrs. old GENDER: Female ENDOSCOPIST: Hart Carwin, MD REFERRED XB:JYNWG Guest, [name] PROCEDURE DATE:  03/23/2013 PROCEDURE:   Colonoscopy with cold biopsy polypectomy First Screening Colonoscopy - Avg.  risk and is 50 yrs.  old or older - No.  Prior Negative Screening - Now for repeat screening. 10 or more years since last screening  History of Adenoma - Now for follow-up colonoscopy & has been > or = to 3 yrs.  N/A  Polyps Removed Today? Yes. ASA CLASS:   Class II INDICATIONS:Average risk patient for colon cancer and last colonoscopy 01/2003 was normal. MEDICATIONS: MAC sedation, administered by CRNA and propofol (Diprivan) 250mg  IV  DESCRIPTION OF PROCEDURE:   After the risks benefits and alternatives of the procedure were thoroughly explained, informed consent was obtained.  A digital rectal exam revealed no abnormalities of the rectum.   The LB [id number] and LB [id number]  endoscope was introduced through the anus and advanced to the cecum, which was identified by both the appendix and ileocecal valve. No adverse events experienced.   The quality of the prep was good, using MoviPrep  The instrument was then slowly withdrawn as the colon was fully examined.      COLON FINDINGS: A polypoid shaped sessile polyp ranging between 3-58mm in size was found in the ascending colon.  A polypectomy was performed with cold forceps.  The resection was complete and the polyp tissue was completely retrieved.   Mild diverticulosis was noted in the sigmoid colon.   Abnormal mucosa was found in the sigmoid colon.between 20 and 30 cm. The mucosa was recurrent and mildly nodular. It was spastic. Multiple biopsies were taken to rule out infiltrating process  Retroflexed views revealed no abnormalities. The time to  cecum=  .  Withdrawal time=  .  The scope was withdrawn and the procedure completed. COMPLICATIONS: There were no complications.  ENDOSCOPIC IMPRESSION: 1.   Sessile polyp ranging between 3-57mm in size was found in the ascending colon; polypectomy was performed with cold forceps 2.   Mild diverticulosis was noted in the sigmoid colon 3.   Abnormal mucosa was found in the sigmoid colon ,between 20 and 30 cm. Spasm and thick folds. Rule out infiltrating process. Status post biopsies  RECOMMENDATIONS: Await pathology results   eSigned:  Hart Carwin, MD 03/23/2013 9:47 AM   cc:   PATIENT NAME:  Tamiya, Colello MR#: [id number]

## 2013-03-23 NOTE — Progress Notes (Signed)
Called to room to assist during endoscopic procedure.  Patient ID and intended procedure confirmed with present staff. Received instructions for my participation in the procedure from the performing physician.  

## 2013-03-23 NOTE — Progress Notes (Signed)
Lidocaine-40mg IV prior to Propofol InductionPropofol given over incremental dosages 

## 2013-03-24 ENCOUNTER — Telehealth: Payer: Self-pay | Admitting: *Deleted

## 2013-03-24 NOTE — Telephone Encounter (Signed)
No answer, message left for the patient. 

## 2013-03-29 ENCOUNTER — Encounter: Payer: Self-pay | Admitting: Internal Medicine

## 2013-04-20 ENCOUNTER — Ambulatory Visit: Payer: Medicare Other

## 2013-04-20 ENCOUNTER — Ambulatory Visit (INDEPENDENT_AMBULATORY_CARE_PROVIDER_SITE_OTHER): Payer: Medicare Other | Admitting: Family Medicine

## 2013-04-20 DIAGNOSIS — M79609 Pain in unspecified limb: Secondary | ICD-10-CM

## 2013-04-20 DIAGNOSIS — R05 Cough: Secondary | ICD-10-CM

## 2013-04-20 DIAGNOSIS — J45909 Unspecified asthma, uncomplicated: Secondary | ICD-10-CM

## 2013-04-20 MED ORDER — BENZONATATE 100 MG PO CAPS: 100.0000 mg | ORAL_CAPSULE | Freq: Three times a day (TID) | ORAL | Status: DC | PRN

## 2013-04-20 MED ORDER — ALBUTEROL SULFATE HFA 108 (90 BASE) MCG/ACT IN AERS: 2.0000 | INHALATION_SPRAY | Freq: Four times a day (QID) | RESPIRATORY_TRACT | Status: DC | PRN

## 2013-04-20 MED ORDER — OXAPROZIN 600 MG PO TABS: ORAL_TABLET | ORAL | Status: DC

## 2013-04-20 MED ORDER — AZITHROMYCIN 250 MG PO TABS: ORAL_TABLET | ORAL | Status: DC

## 2013-04-20 NOTE — Patient Instructions (Signed)
Use the daypro as needed for foot pain.  Be sure to wear supportive shoes, and decrease your walking for a week or so.  If you continue to have foot pain please let me know  Use the antibiotic, inhaler and tessalon perles for your cough.  If your cough is not better in the next week or so please let me know.

## 2013-04-20 NOTE — Progress Notes (Signed)
[facility] [address] [place] [address] [phone]  Date:  04/20/2013   Name:  Miranda [name]   DOB:  11/10/1945   MRN:  [id number]  PCP:  Tally Due, MD    Chief Complaint: wheezing and cough and Foot Pain   History of Present Illness:  Miranda [name] is a 67 y.o. very pleasant female patient who presents with the following:  She is here today for 2 issues. She has noted pain in her right foot for about one week- in the ball of the foot. She has been wearing some new walking shoes with extra padding for support, but notes that she seems to have pain when this padding presses on the bottom of her foot.  A couple of days ago while walking she felt like "the top of my foot went numb."  This did not last or recur.   No known injury.  No unusual activity except she has been walking more over the last couple of weeks.    She also notes coughing and wheezing "off an on for awhile."  She has noted it since late June. Her cough is occasionally productive.   She cannot see any pattern to her wheezing- not worse with exercise, going outdoors, etc.  She has not wheezed in the past that she can recall.   No sneezing or runny nose.  Her eyes and ears do itch.   She has not tried any mediations for this. No fever.   No new allergen exposures that she is aware of.   She is not taking any allergy medications History of HTN and obesity Used daypro in the past for shoulder pain with success  Patient Active Problem List   Diagnosis Date Noted  . HTN (hypertension) 01/17/2012  . Obesity 01/17/2012    Past Medical History  Diagnosis Date  . Arthritis   . Hypertension   . Allergy     Past Surgical History  Procedure Laterality Date  . Gallbladder surgery    . Abdominal hysterectomy    . Cholecystectomy    . Breast biopsy      History  Substance Use Topics  . Smoking status: Never Smoker   . Smokeless tobacco: Never Used  . Alcohol Use: Yes   Comment: very seldom    Family History  Problem Relation Age of Onset  . Heart disease Sister   . Stroke Brother   . Colon cancer Neg Hx   . Esophageal cancer Neg Hx   . Rectal cancer Neg Hx   . Stomach cancer Neg Hx     No Known Allergies  Medication list has been reviewed and updated.  Current Outpatient Prescriptions on File Prior to Visit  Medication Sig Dispense Refill  . atenolol-chlorthalidone (TENORETIC) 50-25 MG per tablet Take 1 tablet by mouth daily.  30 tablet  11  . lisinopril (PRINIVIL,ZESTRIL) 40 MG tablet Take 1 tablet (40 mg total) by mouth daily.  30 tablet  11  . oxaprozin (DAYPRO) 600 MG tablet One twice daily for shoulder. Take with food.  30 tablet  1   No current facility-administered medications on file prior to visit.    Review of Systems:  As per HPI- otherwise negative.   Physical Examination: Filed Vitals:   04/20/13 1100  BP: 128/70  Pulse: 70  Temp: 98.4 F (36.9 C)  Resp: 17   Filed Vitals:   04/20/13 1100  Height: 5' (1.524 m)  Weight: 238 lb (107.956 kg)   Body mass index is 46.48 kg/(m^2). Ideal Body Weight: Weight in (lb) to have BMI = 25: 127.7  GEN: WDWN, NAD, Non-toxic, A & O x 3, obese, coughing in room sporadically HEENT: Atraumatic, Normocephalic. Neck supple. No masses, No LAD. Ears and Nose: No external deformity. CV: RRR, No M/G/R. No JVD. No thrill. No extra heart sounds. PULM: CTA B, no wheezes, crackles, rhonchi. No retractions. No resp. distress. No accessory muscle use. EXTR: No c/c/e NEURO Normal gait.  PSYCH: Normally interactive. Conversant. Not depressed or anxious appearing.  Calm demeanor.  Right foot:  No swelling, heat or redness.  Ankle negative.  Adequate arch.  Mild tenderness under 2nd MT head. No evidence of plantar fascitis   [facility] reading (PRIMARY) by  Dr. Patsy Lager. Negative right foot RIGHT FOOT COMPLETE - 3+ VIEW  Comparison: None.  Findings: Three views of the right foot submitted. No  acute fracture or subluxation. Small plantar spur of the calcaneus.  IMPRESSION: No acute fracture or subluxation. Small plantar spur of the calcaneus.  Clinically significant discrepancy from primary report, if provided: None    Assessment and Plan: Pain, foot, right - Plan: DG Foot Complete Right, oxaprozin (DAYPRO) 600 MG tablet  Cough - Plan: benzonatate (TESSALON) 100 MG capsule, azithromycin (ZITHROMAX) 250 MG tablet  Allergic bronchitis - Plan: albuterol (PROVENTIL HFA;VENTOLIN HFA) 108 (90 BASE) MCG/ACT inhaler, Basic metabolic panel  Overuse injury of foot.  Change to different walking shoes.  Limit walking for a week or so, use Daypro prn.  If not better please clal- in that case consider referral to sports med  Cough and allergic bronchitis: treat as above.  Will check BMP.  Let me know if not better- Sooner if worse.    BP well controlled.   Signed Abbe Amsterdam, MD

## 2013-04-21 ENCOUNTER — Encounter: Payer: Self-pay | Admitting: Family Medicine

## 2013-04-21 LAB — BASIC METABOLIC PANEL
Calcium: 9.4 mg/dL (ref 8.4–10.5)
Sodium: 142 mEq/L (ref 135–145)

## 2013-06-06 ENCOUNTER — Other Ambulatory Visit: Payer: Self-pay | Admitting: Family Medicine

## 2013-11-07 ENCOUNTER — Ambulatory Visit (INDEPENDENT_AMBULATORY_CARE_PROVIDER_SITE_OTHER): Payer: Medicare Other | Admitting: Family Medicine

## 2013-11-07 VITALS — BP 126/78 | HR 64 | Temp 97.7°F | Resp 18 | Ht 60.5 in | Wt 242.4 lb

## 2013-11-07 DIAGNOSIS — R1013 Epigastric pain: Secondary | ICD-10-CM

## 2013-11-07 DIAGNOSIS — K429 Umbilical hernia without obstruction or gangrene: Secondary | ICD-10-CM

## 2013-11-07 LAB — POCT CBC
GRANULOCYTE PERCENT: 70.7 % (ref 37–80)
HEMATOCRIT: 41.3 % (ref 37.7–47.9)
HEMOGLOBIN: 13 g/dL (ref 12.2–16.2)
Lymph, poc: 1.8 (ref 0.6–3.4)
MCH, POC: 25 pg — AB (ref 27–31.2)
MCHC: 31.5 g/dL — AB (ref 31.8–35.4)
MCV: 79.4 fL — AB (ref 80–97)
MID (cbc): 0.4 (ref 0–0.9)
MPV: 9.3 fL (ref 0–99.8)
POC GRANULOCYTE: 5.2 (ref 2–6.9)
POC LYMPH PERCENT: 23.9 %L (ref 10–50)
POC MID %: 5.4 % (ref 0–12)
Platelet Count, POC: 337 10*3/uL (ref 142–424)
RBC: 5.2 M/uL (ref 4.04–5.48)
RDW, POC: 16.3 %
WBC: 7.4 10*3/uL (ref 4.6–10.2)

## 2013-11-07 LAB — COMPREHENSIVE METABOLIC PANEL
ALT: 13 U/L (ref 0–35)
AST: 18 U/L (ref 0–37)
Albumin: 4.2 g/dL (ref 3.5–5.2)
Alkaline Phosphatase: 110 U/L (ref 39–117)
BUN: 12 mg/dL (ref 6–23)
CALCIUM: 9.9 mg/dL (ref 8.4–10.5)
CO2: 32 meq/L (ref 19–32)
CREATININE: 1.01 mg/dL (ref 0.50–1.10)
Chloride: 99 mEq/L (ref 96–112)
GLUCOSE: 107 mg/dL — AB (ref 70–99)
POTASSIUM: 4 meq/L (ref 3.5–5.3)
SODIUM: 139 meq/L (ref 135–145)
Total Bilirubin: 0.4 mg/dL (ref 0.2–1.2)
Total Protein: 7.8 g/dL (ref 6.0–8.3)

## 2013-11-07 MED ORDER — MELOXICAM 7.5 MG PO TABS
7.5000 mg | ORAL_TABLET | Freq: Every day | ORAL | Status: DC
Start: 2013-11-07 — End: 2014-01-13

## 2013-11-07 NOTE — Patient Instructions (Signed)
I think you have a small umbilical hernia- see below.  As we discussed, this hernia seems to be very small and it may stop bothering you.  You can use the medication I gave you (mobic) as needed for soreness.  however if you have severe pain, or especially if you feel the bulging area and cannot push it back in with gentle pressure please seek help right away!  If the area does continue to feel sore or is otherwise bothersome and you WOULD like to possibly have it repaired just give me a call  Hernia A hernia occurs when an internal organ pushes out through a weak spot in the abdominal wall. Hernias most commonly occur in the groin and around the navel. Hernias often can be pushed back into place (reduced). Most hernias tend to get worse over time. Some abdominal hernias can get stuck in the opening (irreducible or incarcerated hernia) and cannot be reduced. An irreducible abdominal hernia which is tightly squeezed into the opening is at risk for impaired blood supply (strangulated hernia). A strangulated hernia is a medical emergency. Because of the risk for an irreducible or strangulated hernia, surgery may be recommended to repair a hernia. CAUSES   Heavy lifting.  Prolonged coughing.  Straining to have a bowel movement.  A cut (incision) made during an abdominal surgery. HOME CARE INSTRUCTIONS   Bed rest is not required. You may continue your normal activities.  Avoid lifting more than 10 pounds (4.5 kg) or straining.  Cough gently. If you are a smoker it is best to stop. Even the best hernia repair can break down with the continual strain of coughing. Even if you do not have your hernia repaired, a cough will continue to aggravate the problem.  Do not wear anything tight over your hernia. Do not try to keep it in with an outside bandage or truss. These can damage abdominal contents if they are trapped within the hernia sac.  Eat a normal diet.  Avoid constipation. Straining over long  periods of time will increase hernia size and encourage breakdown of repairs. If you cannot do this with diet alone, stool softeners may be used. SEEK IMMEDIATE MEDICAL CARE IF:   You have a fever.  You develop increasing abdominal pain.  You feel nauseous or vomit.  Your hernia is stuck outside the abdomen, looks discolored, feels hard, or is tender.  You have any changes in your bowel habits or in the hernia that are unusual for you.  You have increased pain or swelling around the hernia.  You cannot push the hernia back in place by applying gentle pressure while lying down. MAKE SURE YOU:   Understand these instructions.  Will watch your condition.  Will get help right away if you are not doing well or get worse. Document Released: 08/11/2005 Document Revised: 11/03/2011 Document Reviewed: 03/30/2008 [facility] Patient Information 2014 [place].

## 2013-11-07 NOTE — Progress Notes (Signed)
[facility] [address], [place] [address] [phone]  Date:  11/07/2013   Name:  Miranda [name]   DOB:  [birth year]   MRN:  [id number]  PCP:  Kennon Portela, MD    Chief Complaint: Abdominal Pain and loss of appetite   History of Present Illness:  Miranda [name] is a 68 y.o. very pleasant female patient who presents with the following:  Here today to disucss abdominal pain.  She has noted this issue for about one week.  She notes a pain right around her belly button.  It will come and go- worse with lifting. She has noted a knot that seems to come and go.  She does not notice any change in the size of the knot.   She does not have a gallbladder The pain is not worse with eating.   She has noted soft stools but no diarrhea.  No vomiting.  No fever.    Patient Active Problem List   Diagnosis Date Noted  . HTN (hypertension) 01/17/2012  . Obesity 01/17/2012    Past Medical History  Diagnosis Date  . Arthritis   . Hypertension   . Allergy     Past Surgical History  Procedure Laterality Date  . Gallbladder surgery    . Abdominal hysterectomy    . Cholecystectomy    . Breast biopsy      History  Substance Use Topics  . Smoking status: Never Smoker   . Smokeless tobacco: Never Used  . Alcohol Use: Yes     Comment: very seldom    Family History  Problem Relation Age of Onset  . Heart disease Sister   . Stroke Brother   . Colon cancer Neg Hx   . Esophageal cancer Neg Hx   . Rectal cancer Neg Hx   . Stomach cancer Neg Hx     No Known Allergies  Medication list has been reviewed and updated.  Current Outpatient Prescriptions on File Prior to Visit  Medication Sig Dispense Refill  . atenolol-chlorthalidone (TENORETIC) 50-25 MG per tablet Take 1 tablet by mouth daily.  30 tablet  11  . lisinopril (PRINIVIL,ZESTRIL) 40 MG tablet Take 1 tablet (40 mg total) by mouth daily.  30 tablet  11   No current facility-administered medications  on file prior to visit.    Review of Systems:  As per HPI- otherwise negative.   Physical Examination: Filed Vitals:   11/07/13 1148  BP: 126/78  Pulse: 64  Temp: 97.7 F (36.5 C)  Resp: 18   Filed Vitals:   11/07/13 1148  Height: 5' 0.5" (1.537 m)  Weight: 242 lb 6.4 oz (109.952 kg)   Body mass index is 46.54 kg/(m^2). Ideal Body Weight: Weight in (lb) to have BMI = 25: 129.9  GEN: WDWN, NAD, Non-toxic, A & O x 3 HEENT: Atraumatic, Normocephalic. Neck supple. No masses, No LAD. Ears and Nose: No external deformity. CV: RRR, No M/G/R. No JVD. No thrill. No extra heart sounds. PULM: CTA B, no wheezes, crackles, rhonchi. No retractions. No resp. distress. No accessory muscle use. ABD: S, NT, ND, +BS. No rebound. No HSM. She does not have any definite tenderness at this time.  She is able to bear down and cause a small bulge just below the umbilicus. This is reducible with gentle pressure.   EXTR: No c/c/e NEURO Normal gait.  PSYCH: Normally interactive. Conversant. Not depressed or anxious appearing.  Calm demeanor.  Results for orders placed in visit on 11/07/13  POCT CBC      Result Value Ref Range   WBC 7.4  4.6 - 10.2 K/uL   Lymph, poc 1.8  0.6 - 3.4   POC LYMPH PERCENT 23.9  10 - 50 %L   MID (cbc) 0.4  0 - 0.9   POC MID % 5.4  0 - 12 %M   POC Granulocyte 5.2  2 - 6.9   Granulocyte percent 70.7  37 - 80 %G   RBC 5.20  4.04 - 5.48 M/uL   Hemoglobin 13.0  12.2 - 16.2 g/dL   HCT, POC 41.3  37.7 - 47.9 %   MCV 79.4 (*) 80 - 97 fL   MCH, POC 25.0 (*) 27 - 31.2 pg   MCHC 31.5 (*) 31.8 - 35.4 g/dL   RDW, POC 16.3     Platelet Count, POC 337  142 - 424 K/uL   MPV 9.3  0 - 99.8 fL    Assessment and Plan: Abdominal pain, epigastric - Plan: POCT CBC, Comprehensive metabolic panel, meloxicam (MOBIC) 7.5 MG tablet  Umbilical hernia  Likely small umbilical hernia, reducible.  At this time she does not have much discomfort.  Discussed with her in detail.  If she does  develop any pain that does not go away she will seek emergency care.  Will send her to see general surgery if her sx continue to bother her See patient instructions for more details.     Signed Lamar Blinks, MD

## 2013-11-08 ENCOUNTER — Encounter: Payer: Self-pay | Admitting: Family Medicine

## 2014-01-12 ENCOUNTER — Other Ambulatory Visit: Payer: Self-pay | Admitting: Emergency Medicine

## 2014-01-13 ENCOUNTER — Ambulatory Visit (INDEPENDENT_AMBULATORY_CARE_PROVIDER_SITE_OTHER): Payer: Medicare Other | Admitting: Family Medicine

## 2014-01-13 VITALS — BP 124/76 | HR 61 | Temp 97.4°F | Resp 18 | Ht 61.0 in | Wt 245.6 lb

## 2014-01-13 DIAGNOSIS — M25551 Pain in right hip: Secondary | ICD-10-CM

## 2014-01-13 DIAGNOSIS — I10 Essential (primary) hypertension: Secondary | ICD-10-CM

## 2014-01-13 LAB — COMPREHENSIVE METABOLIC PANEL
ALT: 13 U/L (ref 0–35)
AST: 18 U/L (ref 0–37)
Albumin: 3.8 g/dL (ref 3.5–5.2)
Alkaline Phosphatase: 113 U/L (ref 39–117)
BUN: 23 mg/dL (ref 6–23)
CO2: 28 mEq/L (ref 19–32)
Calcium: 9.5 mg/dL (ref 8.4–10.5)
Chloride: 102 mEq/L (ref 96–112)
Creat: 1.12 mg/dL — ABNORMAL HIGH (ref 0.50–1.10)
Glucose, Bld: 99 mg/dL (ref 70–99)
Potassium: 3.6 mEq/L (ref 3.5–5.3)
Sodium: 139 mEq/L (ref 135–145)
Total Bilirubin: 0.3 mg/dL (ref 0.2–1.2)
Total Protein: 7.4 g/dL (ref 6.0–8.3)

## 2014-01-13 MED ORDER — ATENOLOL-CHLORTHALIDONE 50-25 MG PO TABS: ORAL_TABLET | ORAL | Status: DC

## 2014-01-13 MED ORDER — LISINOPRIL 40 MG PO TABS: ORAL_TABLET | ORAL | Status: DC

## 2014-01-13 MED ORDER — METHYLPREDNISOLONE 4 MG PO TABS: 4.0000 mg | ORAL_TABLET | Freq: Every day | ORAL | Status: DC

## 2014-01-13 NOTE — Patient Instructions (Signed)

## 2014-01-13 NOTE — Progress Notes (Signed)
°  This chart was scribed for Caroleen Hamman, MD by Eston Mould, ED Scribe. This patient was seen in room Room/bed 13 and the patient's care was started at 1:20 PM. Subjective:    Patient ID: Miranda [name], female    DOB: [birth year], 68 y.o.   MRN: [id number] Chief Complaint  Patient presents with   Medication Refill    lisinopril, Tenorectic   Leg Pain    rt leg goes numb or feels wet when its not   HPI Miranda [name] is a 68 y.o. female who presents to the [facility] complaining of R leg pain and medication refill. Pt states she is taking Lisinopril and Tenorectic. States she is unsure how long she has had HTN.  She also c/o R hip/upper leg pain that she describes as "weird feeling". She states at times she has numbness and other times, she has a "wet feeling". Pt states she generally has the pain while at work and noticed the pain 6 weeks ago. States she can occasionally have tingling or cold sensation to bilateral hands. Mother died of aneurism and father died of old age. Denies hx of DM and any recent falls or injuries to lower back. Denies SOB, trouble breathing, CP, and edema to LE.  Works at [employer].  Review of Systems  Respiratory: Negative for chest tightness and shortness of breath.   Cardiovascular: Negative for chest pain and leg swelling.  Musculoskeletal: Negative for back pain and joint swelling.  Skin: Negative for color change and wound.  All other systems reviewed and are negative.  Objective:   Physical Exam  Nursing note and vitals reviewed. Constitutional: She is oriented to person, place, and time. She appears well-developed and well-nourished. No distress.  HENT:  Head: Normocephalic and atraumatic.  Eyes: EOM are normal.  Neck: Neck supple. No tracheal deviation present.  Cardiovascular: Normal rate, regular rhythm, normal heart sounds and intact distal pulses.   Pulmonary/Chest: Effort normal and breath sounds normal. No respiratory  distress.  Musculoskeletal: Normal range of motion. She exhibits no edema and no tenderness.  Tender along lateral aspect of R femur. Negative straight leg raise. Hip ROM normal. No trochanter tenderness.  Neurological: She is alert and oriented to person, place, and time.  Skin: Skin is warm and dry.  Psychiatric: She has a normal mood and affect. Her behavior is normal.   Triage Vitals:BP 124/76   Pulse 61   Temp(Src) 97.4 F (36.3 C) (Oral)   Resp 18   Ht 5\' 1"  (1.549 m)   Wt 245 lb 9.6 oz (111.403 kg)   BMI 46.43 kg/m2   SpO2 99% Assessment & Plan:  Will complete labs to check pts potassium levels and will discharge pt Medrol. Hypertension - Plan: Comprehensive metabolic panel, atenolol-chlorthalidone (TENORETIC) 50-25 MG per tablet, lisinopril (PRINIVIL,ZESTRIL) 40 MG tablet  Discomfort of right hip - Plan: methylPREDNISolone (MEDROL) 4 MG tablet  Signed, Robyn Haber, MD

## 2014-03-01 ENCOUNTER — Other Ambulatory Visit: Payer: Self-pay | Admitting: Emergency Medicine

## 2014-03-01 DIAGNOSIS — Z1231 Encounter for screening mammogram for malignant neoplasm of breast: Secondary | ICD-10-CM

## 2014-03-13 ENCOUNTER — Ambulatory Visit (HOSPITAL_COMMUNITY): Payer: Medicare (Managed Care)

## 2014-03-13 ENCOUNTER — Ambulatory Visit (HOSPITAL_COMMUNITY)
Admission: RE | Admit: 2014-03-13 | Discharge: 2014-03-13 | Disposition: A | Discharge status: Home / Self Care | Payer: Medicare Other | Source: Ambulatory Visit | Attending: Emergency Medicine | Admitting: Emergency Medicine

## 2014-03-13 DIAGNOSIS — Z1231 Encounter for screening mammogram for malignant neoplasm of breast: Secondary | ICD-10-CM | POA: Insufficient documentation

## 2014-06-08 ENCOUNTER — Other Ambulatory Visit: Payer: Self-pay | Admitting: Family Medicine

## 2014-06-08 MED ORDER — OXAPROZIN 600 MG PO TABS: 600.0000 mg | ORAL_TABLET | Freq: Every day | ORAL | Status: DC

## 2014-08-14 ENCOUNTER — Ambulatory Visit (INDEPENDENT_AMBULATORY_CARE_PROVIDER_SITE_OTHER): Payer: Medicare Other | Admitting: Family Medicine

## 2014-08-14 ENCOUNTER — Ambulatory Visit (INDEPENDENT_AMBULATORY_CARE_PROVIDER_SITE_OTHER): Payer: Medicare Other

## 2014-08-14 VITALS — BP 140/80 | HR 61 | Temp 97.7°F | Resp 16 | Ht 60.0 in | Wt 241.0 lb

## 2014-08-14 DIAGNOSIS — Z23 Encounter for immunization: Secondary | ICD-10-CM

## 2014-08-14 DIAGNOSIS — M5431 Sciatica, right side: Secondary | ICD-10-CM

## 2014-08-14 MED ORDER — METHOCARBAMOL 500 MG PO TABS: 500.0000 mg | ORAL_TABLET | Freq: Three times a day (TID) | ORAL | Status: DC | PRN

## 2014-08-14 MED ORDER — PREDNISONE 20 MG PO TABS: ORAL_TABLET | ORAL | Status: DC

## 2014-08-14 NOTE — Progress Notes (Signed)
[facility] [address] [place] [address] [phone]  Date:  08/14/2014   Name:  Miranda [name]   DOB:  [birth year]   MRN:  [id number]  PCP:  Kennon Portela, MD    Chief Complaint: Leg Pain   History of Present Illness:  Miranda [name] is a 68 y.o. very pleasant female patient who presents with the following:  She has noted pain in her right leg with intermittent numbness for about one week.  She has had this sort of thing in the past but it has sef- resolved.  No back pain.   NKI.   She may feel a "pins and needles" in her leg.   She has not noted any leg weakness.  No groin numbness, no bowel or bladder control issues Denies any history of DM  Patient Active Problem List   Diagnosis Date Noted  . HTN (hypertension) 01/17/2012  . Obesity 01/17/2012    Past Medical History  Diagnosis Date  . Arthritis   . Hypertension   . Allergy     Past Surgical History  Procedure Laterality Date  . Gallbladder surgery    . Abdominal hysterectomy    . Cholecystectomy    . Breast biopsy      History  Substance Use Topics  . Smoking status: Never Smoker   . Smokeless tobacco: Never Used  . Alcohol Use: Yes     Comment: very seldom    Family History  Problem Relation Age of Onset  . Heart disease Sister   . Stroke Brother   . Colon cancer Neg Hx   . Esophageal cancer Neg Hx   . Rectal cancer Neg Hx   . Stomach cancer Neg Hx     No Known Allergies  Medication list has been reviewed and updated.  Current Outpatient Prescriptions on File Prior to Visit  Medication Sig Dispense Refill  . atenolol-chlorthalidone (TENORETIC) 50-25 MG per tablet One daily 90 tablet 3  . lisinopril (PRINIVIL,ZESTRIL) 40 MG tablet One daily 90 tablet 3  . methylPREDNISolone (MEDROL) 4 MG tablet Take 1 tablet (4 mg total) by mouth daily. (Patient not taking: Reported on 08/14/2014) 5 tablet 0  . oxaprozin (DAYPRO) 600 MG tablet Take 1 tablet (600 mg total) by  mouth daily. (Patient not taking: Reported on 08/14/2014) 30 tablet 0   No current facility-administered medications on file prior to visit.    Review of Systems:  As per HPI- otherwise negative. She has not tried anything OTC for this as of yet.    Physical Examination: Filed Vitals:   08/14/14 1459  BP: 140/80  Pulse: 61  Temp: 97.7 F (36.5 C)  Resp: 16   Filed Vitals:   08/14/14 1459  Height: 5' (1.524 m)  Weight: 241 lb (109.317 kg)   Body mass index is 47.07 kg/(m^2). Ideal Body Weight: Weight in (lb) to have BMI = 25: 127.7  GEN: WDWN, NAD, Non-toxic, A & O x 3, obese, looks well HEENT: Atraumatic, Normocephalic. Neck supple. No masses, No LAD. Ears and Nose: No external deformity. CV: RRR, No M/G/R. No JVD. No thrill. No extra heart sounds. PULM: CTA B, no wheezes, crackles, rhonchi. No retractions. No resp. distress. No accessory muscle use. ABD: S, NT, ND, +BS. No rebound. No HSM. EXTR: No c/c/e NEURO Normal gait.  PSYCH: Normally interactive. Conversant. Not depressed or anxious appearing.  Calm demeanor.  Normal strength, DTR and SLR bilaterally. She notes slightly  different sensation of the right lateral thigh as compared to the left, no saddle anesthesia.   She is tender over the right sciatic notch. Normal lumbar flexion, no back tenderness on exam   [facility] reading (PRIMARY) by  Dr. Lorelei Pont. Lumbar spine: negative LUMBAR SPINE - COMPLETE 4+ VIEW  COMPARISON: None.  FINDINGS: There is no evidence of lumbar spine fracture. Alignment is normal. Intervertebral disc spaces are maintained. Bilateral facet arthropathy at L4-5 and L5-S1.  IMPRESSION: No acute osseous injury of the lumbar spine.  Assessment and Plan: Sciatica, right - Plan: DG Lumbar Spine Complete, predniSONE (DELTASONE) 20 MG tablet, methocarbamol (ROBAXIN) 500 MG tablet  Need for prophylactic vaccination and inoculation against influenza  Treat for sciatica with robaxin as needed and  prednisone.  See patient instructions for more details.     Signed Lamar Blinks, MD

## 2014-08-14 NOTE — Patient Instructions (Signed)
I think that you are having pain from your sciatic nerve Use the prednisone as directed and the robaxin as needed- remember the robaxin can make you feel sleepy Do not take NSAID medications like ibuprofen while you are on prednisone but tylenol is ok Let me know if you do not feel better soon- Sooner if worse.

## 2014-10-23 ENCOUNTER — Encounter: Payer: Self-pay | Admitting: *Deleted

## 2014-12-16 ENCOUNTER — Ambulatory Visit (INDEPENDENT_AMBULATORY_CARE_PROVIDER_SITE_OTHER): Payer: Medicare Other | Admitting: Emergency Medicine

## 2014-12-16 VITALS — BP 140/84 | HR 58 | Temp 97.9°F | Resp 16 | Ht 60.0 in | Wt 238.1 lb

## 2014-12-16 DIAGNOSIS — R0683 Snoring: Secondary | ICD-10-CM | POA: Diagnosis not present

## 2014-12-16 DIAGNOSIS — J309 Allergic rhinitis, unspecified: Secondary | ICD-10-CM | POA: Insufficient documentation

## 2014-12-16 DIAGNOSIS — J029 Acute pharyngitis, unspecified: Secondary | ICD-10-CM | POA: Diagnosis not present

## 2014-12-16 LAB — POCT RAPID STREP A (OFFICE): Rapid Strep A Screen: NEGATIVE

## 2014-12-16 MED ORDER — ALBUTEROL SULFATE HFA 108 (90 BASE) MCG/ACT IN AERS: 2.0000 | INHALATION_SPRAY | RESPIRATORY_TRACT | Status: DC | PRN

## 2014-12-16 NOTE — Progress Notes (Addendum)
Subjective:  This chart was scribed for Miranda Jordan, MD by [facility], medical scribe at [facility].The patient was seen in exam room 01 and the patient's care was started at 1:50 PM.   Patient ID: Miranda [name], female    DOB: [birth year], 69 y.o.   MRN: [id number] Chief Complaint  Patient presents with  . Sinus Pressure    Feels like head is swollen  . Dizziness  . Nausea   HPI  HPI Comments: Miranda [name] is a 69 y.o. female who presents to [facility] complaining of sinus pressure, pt states her head feels swollen, onset one week ago. She also complains of abdominal pain, nausea, sinus pressure, lightheadedness, and sore throat. She doe not have a sore throat currently. Pt stats she wheezes frequently during the night. This past summer she was given an inhaler for her wheezing. Pt has not taken anything for relief. She had similar symptoms around this time last year and did not take any OTC medications then. She does snore and stop breathing during the night. Several family members have sleep apnea.  Patient Active Problem List   Diagnosis Date Noted  . HTN (hypertension) 01/17/2012  . Obesity 01/17/2012   Past Medical History  Diagnosis Date  . Arthritis   . Hypertension   . Allergy    Past Surgical History  Procedure Laterality Date  . Gallbladder surgery    . Abdominal hysterectomy    . Cholecystectomy    . Breast biopsy     No Known Allergies Prior to Admission medications   Medication Sig Start Date End Date Taking? Authorizing Provider  atenolol-chlorthalidone (TENORETIC) 50-25 MG per tablet One daily 01/13/14  Yes Robyn Haber, MD  lisinopril (PRINIVIL,ZESTRIL) 40 MG tablet One daily 01/13/14  Yes Robyn Haber, MD   History   Social History  . Marital Status: Married    Spouse Name: N/A  . Number of Children: N/A  . Years of Education: N/A   Occupational History  . Not on file.   Social History Main Topics  .  Smoking status: Never Smoker   . Smokeless tobacco: Never Used  . Alcohol Use: Yes     Comment: very seldom  . Drug Use: No  . Sexual Activity: No   Other Topics Concern  . Not on file   Social History Narrative   Review of Systems  HENT: Positive for sinus pressure and sore throat.   Respiratory: Positive for apnea and wheezing.   Gastrointestinal: Positive for nausea and abdominal pain.  Allergic/Immunologic: Positive for environmental allergies.  Neurological: Positive for light-headedness.      Objective:  BP 140/84 mmHg  Pulse 58  Temp(Src) 97.9 F (36.6 C) (Oral)  Resp 16  Ht 5' (1.524 m)  Wt 238 lb 2 oz (108.013 kg)  BMI 46.51 kg/m2  SpO2 96% Physical Exam  Nursing note and vitals reviewed. CONSTITUTIONAL: Well developed/well nourished HEAD: Normocephalic/atraumatic EYES: EOMI/PERRL ENMT: Mucous membranes moist, turbinates are blue/grey bilaterally. NECK: supple no meningeal signs SPINE/BACK:entire spine nontender CV: S1/S2 noted, no murmurs/rubs/gallops noted, mild wheezing on end expiration. LUNGS: Lungs are clear to auscultation bilaterally, no apparent distress ABDOMEN: soft, nontender, no rebound or guarding, bowel sounds noted throughout abdomen GU:no cva tenderness NEURO: Pt is awake/alert/appropriate, moves all extremitiesx4.  No facial droop.   EXTREMITIES: pulses normal/equal, full ROM SKIN: warm, color normal PSYCH: no abnormalities of mood noted, alert and oriented to situation Peak  flow was 300.    Results for orders placed or performed in visit on 12/16/14  POCT rapid strep A  Result Value Ref Range   Rapid Strep A Screen Negative Negative   Assessment & Plan:   I suspect patient has a allergic rhinitis. She occasionally has wheezing especially at night. She is of note on lisinopril but cough is not a major complaint. We'll try Flonase spray along with Zyrtec. She was given an albuterol inhaler to have for wheezing. Sleep study will be  ordered in that she does have snoring with wheezing at night. Of note she also has a history of hypertension.I personally performed the services described in this documentation, which was scribed in my presence. The recorded information has been reviewed and is accurate.

## 2014-12-16 NOTE — Patient Instructions (Signed)
Allergic Rhinitis #1 take his Zyrtec 10 mg 1 a day. #2 use Flonase 2 puffs each nares daily. #3  albuterol inhaler 2 puffs every 4-6 hours as needed  Allergic rhinitis is when the mucous membranes in the nose respond to allergens. Allergens are particles in the air that cause your body to have an allergic reaction. This causes you to release allergic antibodies. Through a chain of events, these eventually cause you to release histamine into the blood stream. Although meant to protect the body, it is this release of histamine that causes your discomfort, such as frequent sneezing, congestion, and an itchy, runny nose.  CAUSES  Seasonal allergic rhinitis (hay fever) is caused by pollen allergens that may come from grasses, trees, and weeds. Year-round allergic rhinitis (perennial allergic rhinitis) is caused by allergens such as house dust mites, pet dander, and mold spores.  SYMPTOMS   Nasal stuffiness (congestion).  Itchy, runny nose with sneezing and tearing of the eyes. DIAGNOSIS  Your health care provider can help you determine the allergen or allergens that trigger your symptoms. If you and your health care provider are unable to determine the allergen, skin or blood testing may be used. TREATMENT  Allergic rhinitis does not have a cure, but it can be controlled by:  Medicines and allergy shots (immunotherapy).  Avoiding the allergen. Hay fever may often be treated with antihistamines in pill or nasal spray forms. Antihistamines block the effects of histamine. There are over-the-counter medicines that may help with nasal congestion and swelling around the eyes. Check with your health care provider before taking or giving this medicine.  If avoiding the allergen or the medicine prescribed do not work, there are many new medicines your health care provider can prescribe. Stronger medicine may be used if initial measures are ineffective. Desensitizing injections can be used if medicine and  avoidance does not work. Desensitization is when a patient is given ongoing shots until the body becomes less sensitive to the allergen. Make sure you follow up with your health care provider if problems continue. HOME CARE INSTRUCTIONS It is not possible to completely avoid allergens, but you can reduce your symptoms by taking steps to limit your exposure to them. It helps to know exactly what you are allergic to so that you can avoid your specific triggers. SEEK MEDICAL CARE IF:   You have a fever.  You develop a cough that does not stop easily (persistent).  You have shortness of breath.  You start wheezing.  Symptoms interfere with normal daily activities. Document Released: 05/06/2001 Document Revised: 08/16/2013 Document Reviewed: 04/18/2013 [facility] Patient Information 2015 [place], [place]. This information is not intended to replace advice given to you by your health care provider. Make sure you discuss any questions you have with your health care provider.

## 2015-02-02 ENCOUNTER — Ambulatory Visit (INDEPENDENT_AMBULATORY_CARE_PROVIDER_SITE_OTHER): Payer: Medicare Other | Admitting: Neurology

## 2015-02-02 ENCOUNTER — Encounter: Payer: Self-pay | Admitting: Neurology

## 2015-02-02 VITALS — BP 140/92 | HR 58 | Resp 16 | Ht 60.0 in | Wt 232.0 lb

## 2015-02-02 DIAGNOSIS — R351 Nocturia: Secondary | ICD-10-CM

## 2015-02-02 DIAGNOSIS — R519 Headache, unspecified: Secondary | ICD-10-CM

## 2015-02-02 DIAGNOSIS — R600 Localized edema: Secondary | ICD-10-CM | POA: Diagnosis not present

## 2015-02-02 DIAGNOSIS — E669 Obesity, unspecified: Secondary | ICD-10-CM | POA: Diagnosis not present

## 2015-02-02 DIAGNOSIS — R51 Headache: Secondary | ICD-10-CM | POA: Diagnosis not present

## 2015-02-02 DIAGNOSIS — G4733 Obstructive sleep apnea (adult) (pediatric): Secondary | ICD-10-CM | POA: Diagnosis not present

## 2015-02-02 DIAGNOSIS — G4719 Other hypersomnia: Secondary | ICD-10-CM | POA: Diagnosis not present

## 2015-02-02 NOTE — Progress Notes (Signed)
Subjective:    Patient ID: PAIZLIE KLAUS is a 69 y.o. female.  HPI     Star Age, MD, PhD [facility] [address] [place], [place] [address]  Dear Dr. Everlene Farrier,   I saw your patient, Cashae Weich, upon your kind request in my neurologic clinic today for initial consultation of her sleep disorder, in particular, concern for underlying obstructive sleep apnea. The patient is unaccompanied today. As you know, Ms. Emry is a very pleasant 69 year old right-handed woman with an underlying medical history of hypertension, allergies, arthritis and obesity, who reports snoring and witnessed apneas while asleep. She has a family history of obstructive sleep apnea as well. She complains of excessive daytime somnolence. Her Epworth sleepiness score is 11 out of 24 today and her fatigue score is 51 out of 63.  Her bedtime is usually late at around 2 AM. She's always gone to bed late even when she worked 2 jobs. 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 Her rise time is around 9 or 10 AM. Typically she will get up at 545 and drink something such as juice and then go back to bed and go back to sleep. She does have nocturia on average 2 times per night. Occasionally she will wake up with a headache. She has been noted to snore which can be loud. Her husband sleeps in a separate bedroom. She lives with her husband and one of her 2 daughters. She has been told that she makes pauses in her breathing and then resumes snoring. One of her daughters has sleep apnea on a CPAP machine. One of her nephews has sleep apnea and also is on a CPAP machine. She is a nonsmoker. She drinks alcohol very rarely. She likes to drink about 2 sodas per day and drinks bottled green tea about 2 or 3 bottles per day. She denies any restless leg symptoms or twitching in her sleep. She has had leg cramps at night. She may not drink enough water. She is trying to lose  weight. She has been able to lose a few pounds.  Her Past Medical History Is Significant For: Past Medical History  Diagnosis Date  . Arthritis   . Hypertension   . Allergy     Her Past Surgical History Is Significant For: Past Surgical History  Procedure Laterality Date  . Gallbladder surgery    . Abdominal hysterectomy    . Cholecystectomy    . Breast biopsy      Her Family History Is Significant For: Family History  Problem Relation Age of Onset  . Heart disease Sister   . Stroke Brother   . Colon cancer Neg Hx   . Esophageal cancer Neg Hx   . Rectal cancer Neg Hx   . Stomach cancer Neg Hx     Her Social History Is Significant For: History   Social History  . Marital Status: Married    Spouse Name: Sheppard Coil  . Number of Children: 2  . Years of Education: HS   Occupational History  . [employer]    Social History Main Topics  . Smoking status: Never Smoker   . Smokeless tobacco: Never Used  . Alcohol Use: 0.0 oz/week    0 Standard drinks or equivalent per week     Comment: very seldom  . Drug Use: No  . Sexual Activity: No   Other Topics Concern  . None   Social  History Narrative   1-2 sodas a day, daily consumption of tea    Her Allergies Are:  No Known Allergies:   Her Current Medications Are:  Outpatient Encounter Prescriptions as of 02/02/2015  Medication Sig  . albuterol (PROVENTIL HFA;VENTOLIN HFA) 108 (90 BASE) MCG/ACT inhaler Inhale 2 puffs into the lungs every 4 (four) hours as needed for wheezing or shortness of breath (cough, shortness of breath or wheezing.).  Marland Kitchen atenolol-chlorthalidone (TENORETIC) 50-25 MG per tablet One daily  . lisinopril (PRINIVIL,ZESTRIL) 40 MG tablet One daily   No facility-administered encounter medications on file as of 02/02/2015.  :  Review of Systems:  Out of a complete 14 point review of systems, all are reviewed and negative with the exception of these symptoms as listed below:   Review of Systems   Constitutional: Positive for fatigue.  Respiratory: Positive for cough, shortness of breath and wheezing.   Cardiovascular: Positive for leg swelling.  Neurological: Positive for numbness.       No trouble falling asleep, Sleeps 2-3 hours at a time, Snoring, witnessed apnea, wakes up choking/gasping for air, wakes up tired in the morning, takes naps during the day.   Psychiatric/Behavioral:       Too much sleep, change in appetitie    Objective:  Neurologic Exam  Physical Exam Physical Examination:   Filed Vitals:   02/02/15 0847  BP: 140/92  Pulse: 58  Resp: 16    General Examination: The patient is a very pleasant 69 y.o. female in no acute distress. She appears well-developed and well-nourished and well groomed.   HEENT: Normocephalic, atraumatic, pupils are equal, round and reactive to light and accommodation. Funduscopic exam is normal with sharp disc margins noted. Extraocular tracking is good without limitation to gaze excursion or nystagmus noted. Normal smooth pursuit is noted. Hearing is grossly intact. Tympanic membranes are clear bilaterally. Face is symmetric with normal facial animation and normal facial sensation. Speech is clear with no dysarthria noted. There is no hypophonia. There is no lip, neck/head, jaw or voice tremor. Neck is supple with full range of passive and active motion. There are no carotid bruits on auscultation. Oropharynx exam reveals: mild mouth dryness, adequate dental hygiene and moderate airway crowding, due to wider tongue, thicker soft palate, tonsils are 1+ bilaterally. Mallampati is class II. Tongue protrudes centrally and palate elevates symmetrically. Neck size is 15 and 18 inches. She has an underbite. Nasal inspection reveals no significant nasal mucosal bogginess or redness and no septal deviation.   Chest: Clear to auscultation without wheezing, rhonchi or crackles noted.  Heart: S1+S2+0, regular and normal without murmurs, rubs or  gallops noted.   Abdomen: Soft, non-tender and non-distended with normal bowel sounds appreciated on auscultation.  Extremities: There is trace pitting edema in the distal lower extremities bilaterally. Pedal pulses are intact.  Skin: Warm and dry without trophic changes noted. There are no varicose veins.  Musculoskeletal: exam reveals no obvious joint deformities, tenderness or joint swelling or erythema.   Neurologically:  Mental status: The patient is awake, alert and oriented in all 4 spheres. Her immediate and remote memory, attention, language skills and fund of knowledge are appropriate. There is no evidence of aphasia, agnosia, apraxia or anomia. Speech is clear with normal prosody and enunciation. Thought process is linear. Mood is normal and affect is normal.  Cranial nerves II - XII are as described above under HEENT exam. In addition: shoulder shrug is normal with equal shoulder height noted. Motor exam:  Normal bulk, strength and tone is noted. There is no drift, tremor or rebound. Romberg is negative. Reflexes are 2+ throughout. Fine motor skills and coordination: intact with normal finger taps, normal hand movements, normal rapid alternating patting, normal foot taps and normal foot agility.  Cerebellar testing: No dysmetria or intention tremor on finger to nose testing. Heel to shin is difficult for her due to body habitus. There is no truncal or gait ataxia.  Sensory exam: intact to light touch, pinprick, vibration, temperature sense in the upper and lower extremities.  Gait, station and balance: She stands easily. No veering to one side is noted. No leaning to one side is noted. Posture is age-appropriate and stance is narrow based. Gait shows normal stride length and normal pace. No problems turning are noted. She turns en bloc. Tandem walk is slightly difficult for her.   Assessment and Plan:   In summary, Arliss P Severtson is a very pleasant 69 y.o.-year old female with an  underlying medical history of hypertension, allergies, arthritis and obesity, who reports snoring, excessive daytime somnolence, morning headaches, nocturia, and witnessed apneas while asleep. Her history and physical exam are in keeping with obstructive sleep apnea (OSA). I had a long chat with the patient about my findings and the diagnosis of OSA, its prognosis and treatment options. We talked about medical treatments, surgical interventions and non-pharmacological approaches. I explained in particular the risks and ramifications of untreated moderate to severe OSA, especially with respect to developing cardiovascular disease down the Road, including congestive heart failure, difficult to treat hypertension, cardiac arrhythmias, or stroke. Even type 2 diabetes has, in part, been linked to untreated OSA. Symptoms of untreated OSA include daytime sleepiness, memory problems, mood irritability and mood disorder such as depression and anxiety, lack of energy, as well as recurrent headaches, especially morning headaches. We talked about trying to maintain a healthy lifestyle in general, as well as the importance of weight control. I encouraged the patient to eat healthy, exercise daily and keep well hydrated, to keep a scheduled bedtime and wake time routine, to not skip any meals and eat healthy snacks in between meals. I advised the patient not to drive when feeling sleepy. I recommended the following at this time: sleep study with potential positive airway pressure titration. (We will score hypopneas at 4% and split the sleep study into diagnostic and treatment portion, if the estimated. 2 hour AHI is >20/h).   I explained the sleep test procedure to the patient and also outlined possible surgical and non-surgical treatment options of OSA, including the use of a custom-made dental device (which would require a referral to a specialist dentist or oral surgeon), upper airway surgical options, such as pillar  implants, radiofrequency surgery, tongue base surgery, and UPPP (which would involve a referral to an ENT surgeon). Rarely, jaw surgery such as mandibular advancement may be considered.  I also explained the CPAP treatment option to the patient, who indicated that she would be willing to try CPAP if the need arises. I explained the importance of being compliant with PAP treatment, not only for insurance purposes but primarily to improve Her symptoms, and for the patient's long term health benefit, including to reduce Her cardiovascular risks. I answered all her questions today and the patient was in agreement. I would like to see her back after the sleep study is completed and encouraged her to call with any interim questions, concerns, problems or updates.   Thank you very much for allowing  me to participate in the care of this nice patient. If I can be of any further assistance to you please do not hesitate to call me at [phone].  Sincerely,   Star Age, MD, PhD

## 2015-02-02 NOTE — Patient Instructions (Signed)

## 2015-02-13 ENCOUNTER — Other Ambulatory Visit: Payer: Self-pay | Admitting: Physician Assistant

## 2015-02-13 ENCOUNTER — Telehealth: Payer: Self-pay

## 2015-02-13 ENCOUNTER — Other Ambulatory Visit: Payer: Self-pay | Admitting: Family Medicine

## 2015-02-13 DIAGNOSIS — I10 Essential (primary) hypertension: Secondary | ICD-10-CM

## 2015-02-13 MED ORDER — ATENOLOL-CHLORTHALIDONE 50-25 MG PO TABS: ORAL_TABLET | ORAL | Status: DC

## 2015-02-13 NOTE — Progress Notes (Signed)
Patient requesting BP medication refill and has not been seen for this problem in over a year.  She has not had a CMET since either.  Prescribed 1 month to allow patient time to come in for follow up. Philis Fendt, MS, PA-C   2:41 PM, 02/13/2015

## 2015-02-13 NOTE — Telephone Encounter (Signed)
Patient needs a refill on her Atenolol. She states that she called her pharmacy and they instructed her to call our office and request because that would make the process move faster. Electronic request from pharmacy received at 12:44pm.  [phone]

## 2015-02-13 NOTE — Telephone Encounter (Signed)
Spoke with pt, advised message from [place]. Pt understood and will come in.

## 2015-02-13 NOTE — Telephone Encounter (Signed)
I will provide one month.  Patient needs to be reevaluated and has had no HTN labs in one year.  Please offer to schedule patient at 104 if that is more convenient for her.

## 2015-02-13 NOTE — Telephone Encounter (Signed)
Please advise on refill.

## 2015-02-27 ENCOUNTER — Ambulatory Visit (INDEPENDENT_AMBULATORY_CARE_PROVIDER_SITE_OTHER): Payer: Medicare Other | Admitting: Family Medicine

## 2015-02-27 VITALS — BP 130/80 | HR 66 | Temp 98.2°F | Resp 16 | Ht 61.0 in | Wt 234.0 lb

## 2015-02-27 DIAGNOSIS — Z131 Encounter for screening for diabetes mellitus: Secondary | ICD-10-CM | POA: Diagnosis not present

## 2015-02-27 DIAGNOSIS — Z130 Encounter for screening for diseases of the blood and blood-forming organs and certain disorders involving the immune mechanism: Secondary | ICD-10-CM

## 2015-02-27 DIAGNOSIS — I10 Essential (primary) hypertension: Secondary | ICD-10-CM | POA: Diagnosis not present

## 2015-02-27 DIAGNOSIS — R0789 Other chest pain: Secondary | ICD-10-CM

## 2015-02-27 DIAGNOSIS — Z13220 Encounter for screening for lipoid disorders: Secondary | ICD-10-CM

## 2015-02-27 DIAGNOSIS — E669 Obesity, unspecified: Secondary | ICD-10-CM

## 2015-02-27 LAB — COMPREHENSIVE METABOLIC PANEL
ALK PHOS: 93 U/L (ref 39–117)
ALT: 16 U/L (ref 0–35)
AST: 19 U/L (ref 0–37)
Albumin: 3.7 g/dL (ref 3.5–5.2)
BUN: 16 mg/dL (ref 6–23)
CO2: 29 meq/L (ref 19–32)
Calcium: 9.4 mg/dL (ref 8.4–10.5)
Chloride: 99 mEq/L (ref 96–112)
Creat: 0.95 mg/dL (ref 0.50–1.10)
GLUCOSE: 99 mg/dL (ref 70–99)
Potassium: 4 mEq/L (ref 3.5–5.3)
SODIUM: 137 meq/L (ref 135–145)
TOTAL PROTEIN: 7 g/dL (ref 6.0–8.3)
Total Bilirubin: 0.4 mg/dL (ref 0.2–1.2)

## 2015-02-27 LAB — LIPID PANEL
CHOLESTEROL: 189 mg/dL (ref 0–200)
HDL: 72 mg/dL (ref >=46)
LDL Cholesterol: 103 mg/dL — ABNORMAL HIGH (ref 0–99)
Total CHOL/HDL Ratio: 2.6 Ratio
Triglycerides: 72 mg/dL (ref <150)
VLDL: 14 mg/dL (ref 0–40)

## 2015-02-27 MED ORDER — LISINOPRIL 40 MG PO TABS: ORAL_TABLET | ORAL | Status: DC

## 2015-02-27 MED ORDER — ATENOLOL-CHLORTHALIDONE 50-25 MG PO TABS: ORAL_TABLET | ORAL | Status: DC

## 2015-02-27 NOTE — Patient Instructions (Signed)
I refilled your medications today- your blood pressure looks good I will be in touch with your labs Let me know if your back pain does not continue to ease I suspect that the throat pain you had last night was due to heartburn/ acid If it happens again try taking tums and let me know if this helps.  If it does help you might try taking prilsec over the counter for a couple of weeks If the tums to NOT help seek care if persistent chest pain!

## 2015-02-27 NOTE — Progress Notes (Signed)
[facility] [address] [place] [address] [phone]  Date:  02/27/2015   Name:  Miranda [name]   DOB:  12/27/1945   MRN:  [id number]  PCP:  Kennon Portela, MD    Chief Complaint: Back Pain; Sore Throat; and Medication Refill   History of Present Illness:  Miranda [name] is a 69 y.o. very pleasant female patient who presents with the following:  Here today seeking medication refills and also with a couple of acute complaints.   Last night she noted what sounds like GERD- she felt a "burning or pressure" in her throat - it seemed to go all the way down her throat.  She does not generally have GERD or sx like this She was about to go to the ER when it went away.  No current pain.  She states it was NOT chest pain.  Drinking cold water helped last night but she did not try any tums, etc.  She does not have this pain now She also notes pain in her lower back yesterday- this is also now "a lot better."  She does not want any medication for this.  She does have occasional back aches No urinary sx, no hematuria.   She is not aware of any injury.  They were on vacation last week and slept in a different bed, sat in uncomfortable chairs.   No pain in her legs, no numbness currently but she states she does have occasional tingling in her right leg  BP Readings from Last 3 Encounters:  02/27/15 130/80  02/02/15 140/92  12/16/14 140/84     Patient Active Problem List   Diagnosis Date Noted  . Allergic rhinitis 12/16/2014  . Snoring 12/16/2014  . HTN (hypertension) 01/17/2012  . Obesity 01/17/2012    Past Medical History  Diagnosis Date  . Arthritis   . Hypertension   . Allergy     Past Surgical History  Procedure Laterality Date  . Gallbladder surgery    . Abdominal hysterectomy    . Cholecystectomy    . Breast biopsy      History  Substance Use Topics  . Smoking status: Never Smoker   . Smokeless tobacco: Never Used  . Alcohol Use: 0.0  oz/week    0 Standard drinks or equivalent per week     Comment: very seldom    Family History  Problem Relation Age of Onset  . Heart disease Sister   . Stroke Brother   . Colon cancer Neg Hx   . Esophageal cancer Neg Hx   . Rectal cancer Neg Hx   . Stomach cancer Neg Hx     No Known Allergies  Medication list has been reviewed and updated.  Current Outpatient Prescriptions on File Prior to Visit  Medication Sig Dispense Refill  . albuterol (PROVENTIL HFA;VENTOLIN HFA) 108 (90 BASE) MCG/ACT inhaler Inhale 2 puffs into the lungs every 4 (four) hours as needed for wheezing or shortness of breath (cough, shortness of breath or wheezing.). 1 Inhaler 1  . atenolol-chlorthalidone (TENORETIC) 50-25 MG per tablet One daily. Return to the office for future refills. 30 tablet 0  . lisinopril (PRINIVIL,ZESTRIL) 40 MG tablet One daily 90 tablet 3   No current facility-administered medications on file prior to visit.    Review of Systems:  As per HPI- otherwise negative. She is fasting today.     Physical Examination: Filed Vitals:   02/27/15 1054  BP: 130/80  Pulse: 66  Temp: 98.2 F (36.8 C)  Resp: 16   Filed Vitals:   02/27/15 1054  Height: 5\' 1"  (1.549 m)  Weight: 234 lb (106.142 kg)   Body mass index is 44.24 kg/(m^2). Ideal Body Weight: Weight in (lb) to have BMI = 25: 132  GEN: WDWN, NAD, Non-toxic, A & O x 3, obese, looks well HEENT: Atraumatic, Normocephalic. Neck supple. No masses, No LAD.  Bilateral TM wnl, oropharynx normal.  PEERL,EOMI.   Ears and Nose: No external deformity. CV: RRR, No M/G/R. No JVD. No thrill. No extra heart sounds. PULM: CTA B, no wheezes, crackles, rhonchi. No retractions. No resp. distress. No accessory muscle use. ABD: S, NT, ND EXTR: No c/c/e NEURO Normal gait.  PSYCH: Normally interactive. Conversant. Not depressed or anxious appearing.  Calm demeanor.  She has mild right sided muscular lumbar tenderness Normal BLE strength,  sensation and DTR, negative SLR Normal lumbar flexion  EKG: mild sinus brady- OW normal Assessment and Plan: Essential hypertension - Plan: lisinopril (PRINIVIL,ZESTRIL) 40 MG tablet, atenolol-chlorthalidone (TENORETIC) 50-25 MG per tablet, Comprehensive metabolic panel, Lipid panel, Hemoglobin A1c  Obesity - Plan: Lipid panel, Hemoglobin A1c  Screening for hyperlipidemia - Plan: Lipid panel  Screening for diabetes mellitus - Plan: Hemoglobin A1c  Screening for deficiency anemia - Plan: CBC  Other chest pain - Plan: EKG 12-Lead  Screening labs as above and refilled her BP medications- BP is well controlled EKG is reassuring- if CP comes back she will try acid treatment and seek care if not better She does not desire any treatment for her back pain- it is already better  Signed Lamar Blinks, MD

## 2015-02-28 ENCOUNTER — Encounter: Payer: Self-pay | Admitting: Family Medicine

## 2015-02-28 DIAGNOSIS — R7303 Prediabetes: Secondary | ICD-10-CM | POA: Insufficient documentation

## 2015-02-28 LAB — HEMOGLOBIN A1C
HEMOGLOBIN A1C: 6.2 % — AB (ref <5.7)
Mean Plasma Glucose: 131 mg/dL — ABNORMAL HIGH (ref <117)

## 2015-02-28 LAB — CBC
HCT: 37.2 % (ref 36.0–46.0)
HEMOGLOBIN: 12.5 g/dL (ref 12.0–15.0)
MCH: 25.1 pg — ABNORMAL LOW (ref 26.0–34.0)
MCHC: 33.6 g/dL (ref 30.0–36.0)
MCV: 74.5 fL — ABNORMAL LOW (ref 78.0–100.0)
MPV: 9.5 fL (ref 8.6–12.4)
PLATELETS: 263 10*3/uL (ref 150–400)
RBC: 4.99 MIL/uL (ref 3.87–5.11)
RDW: 16.1 % — AB (ref 11.5–15.5)
WBC: 6.4 10*3/uL (ref 4.0–10.5)

## 2015-03-12 ENCOUNTER — Ambulatory Visit (INDEPENDENT_AMBULATORY_CARE_PROVIDER_SITE_OTHER): Payer: Medicare Other | Admitting: Neurology

## 2015-03-12 VITALS — BP 121/78 | HR 69

## 2015-03-12 DIAGNOSIS — G479 Sleep disorder, unspecified: Secondary | ICD-10-CM

## 2015-03-12 DIAGNOSIS — G4733 Obstructive sleep apnea (adult) (pediatric): Secondary | ICD-10-CM | POA: Diagnosis not present

## 2015-03-12 DIAGNOSIS — G4730 Sleep apnea, unspecified: Secondary | ICD-10-CM

## 2015-03-12 DIAGNOSIS — G4710 Hypersomnia, unspecified: Secondary | ICD-10-CM

## 2015-03-13 NOTE — Sleep Study (Signed)
Please see the scanned sleep study interpretation located in the Procedure tab within the Chart Review section. 

## 2015-03-21 ENCOUNTER — Telehealth: Payer: Self-pay | Admitting: Neurology

## 2015-03-21 DIAGNOSIS — G4733 Obstructive sleep apnea (adult) (pediatric): Secondary | ICD-10-CM

## 2015-03-21 DIAGNOSIS — G4719 Other hypersomnia: Secondary | ICD-10-CM

## 2015-03-21 NOTE — Telephone Encounter (Signed)
Patient seen on 02/02/15, PSG on 03/12/15. Beverlee Nims: Please call and notify the patient that the recent sleep study did confirm the diagnosis of obstructive sleep apnea and that I recommend treatment for this in the form of CPAP. This will require a repeat sleep study for proper titration and mask fitting and to make sure, her oxygen saturations improve with treatment. Please explain to patient and arrange for a CPAP titration study. I have placed an order in the chart. Please route note to Dawn, Thanks,  Star Age, MD, PhD [facility])

## 2015-03-21 NOTE — Telephone Encounter (Signed)
I spoke to patient. She is aware of results and would like to proceed with titration study. I will fax PSG to PCP.

## 2015-04-12 ENCOUNTER — Ambulatory Visit (INDEPENDENT_AMBULATORY_CARE_PROVIDER_SITE_OTHER): Payer: Medicare Other | Admitting: Neurology

## 2015-04-12 DIAGNOSIS — G4733 Obstructive sleep apnea (adult) (pediatric): Secondary | ICD-10-CM | POA: Diagnosis not present

## 2015-04-12 DIAGNOSIS — Z0289 Encounter for other administrative examinations: Secondary | ICD-10-CM

## 2015-04-12 NOTE — Sleep Study (Signed)
Please see the scanned sleep study interpretation located in the procedure tab in the chart view section.  

## 2015-04-17 ENCOUNTER — Telehealth: Payer: Self-pay | Admitting: Neurology

## 2015-04-17 DIAGNOSIS — G4733 Obstructive sleep apnea (adult) (pediatric): Secondary | ICD-10-CM

## 2015-04-17 NOTE — Telephone Encounter (Signed)
Patient seen on 02/02/15, PSG on 03/12/15, CPAP titration on 04/12/15, ins: Medicare/BCBS.  Please call and inform patient that I have entered an order for treatment with PAP. She did well during the latest sleep study with CPAP. We will, therefore, arrange for a machine for home use through a DME (durable medical equipment) company of Her choice; and I will see the patient back in follow-up in about 8-10 weeks. Please also explain to the patient that I will be looking out for compliance data downloaded from the machine, which can be done remotely through a modem at times or stored on an SD card in the back of the machine. At the time of the followup appointment we will discuss sleep study results and how it is going with PAP treatment at home. Please advise patient to bring Her machine at the time of the visit; at least for the first visit, even though this is cumbersome. Bringing the machine for every visit after that may not be needed, but often helps for the first visit. Please also make sure, the patient has a follow-up appointment with me in about 8-10 weeks from the setup date, thanks.   Star Age, MD, PhD [facility])

## 2015-04-19 NOTE — Telephone Encounter (Signed)
Patient is returning a call. °

## 2015-04-19 NOTE — Telephone Encounter (Signed)
I spoke to patient and she is aware of results and would like to start CPAP at home. I will refer to [facility]. F/U appt was made in October.  I will send patient a letter reminding her of appt and importance of compliance.

## 2015-04-19 NOTE — Telephone Encounter (Signed)
Left message to call back I will fax copy to PCP.

## 2015-06-18 ENCOUNTER — Ambulatory Visit: Payer: Self-pay | Admitting: Neurology

## 2015-06-18 ENCOUNTER — Telehealth: Payer: Self-pay

## 2015-06-18 NOTE — Telephone Encounter (Signed)
Patient did not show to appt today  

## 2015-06-19 ENCOUNTER — Encounter: Payer: Self-pay | Admitting: Neurology

## 2015-07-09 ENCOUNTER — Encounter: Payer: Self-pay | Admitting: Neurology

## 2015-07-09 ENCOUNTER — Ambulatory Visit (INDEPENDENT_AMBULATORY_CARE_PROVIDER_SITE_OTHER): Payer: Medicare Other | Admitting: Neurology

## 2015-07-09 VITALS — BP 122/68 | HR 68 | Resp 16 | Ht 61.0 in | Wt 235.0 lb

## 2015-07-09 DIAGNOSIS — G4733 Obstructive sleep apnea (adult) (pediatric): Secondary | ICD-10-CM

## 2015-07-09 DIAGNOSIS — Z9989 Dependence on other enabling machines and devices: Principal | ICD-10-CM

## 2015-07-09 NOTE — Progress Notes (Signed)
Subjective:    Patient ID: Miranda [name] is a 69 y.o. female.  HPI     Interim history:   Miranda [name] is a very pleasant 69 year old right-handed woman with an underlying medical history of hypertension, allergies, arthritis and obesity, who presents for follow-up consultation of her obstructive sleep apnea, now on treatment with CPAP therapy at home. The patient is unaccompanied today. Of note, the patient no showed for appointment on 06/18/2015. I first met her on 02/02/2015 at the request of her primary care physician, at which time she reported snoring, witnessed apneas, and excessive daytime somnolence. I asked her to return for sleep study. She had a baseline sleep study, followed by a CPAP titration study and I went over her test results with her in detail today. Her baseline sleep study from 03/12/2015 showed a sleep efficiency of 63.4% with a sleep latency of 29 minutes and wake after sleep onset of 146.5 minutes with moderate sleep fragmentation noted. She had an elevated arousal index. She had slow-wave sleep at 22.6%, REM sleep at 13.6% with a mildly prolonged REM latency. She had no significant PLMS, EKG or EEG changes. She had mild to moderate snoring. She had a total AHI of 8.9 per hour, rising to 47.7 per hour during REM sleep at 15.8 per hour in the supine position. Baseline oxygen saturation was 96%, nadir was 81%. Time below 88% saturation was 5 minutes and 28 seconds. Based on her sleep-related concerns and her baseline sleep test results I invited her back for a CPAP titration study. She had this on 04/12/2015. Sleep efficiency was 85%. Sleep latency was 20 minutes and wake after sleep onset was 28 minutes with moderate, then mild sleep fragmentation noted. She had an increased percentage of stage II sleep. She had a decreased percentage of slow-wave sleep and a near normal percentage of REM sleep. She had no significant PLMS. She had a normal REM latency. She had no significant EEG or EKG  changes. Snoring was eliminated. CPAP was titrated from 5 cm to 9 cm. AHI was 0 per hour and the final pressure. Average oxygen saturation was 95%, nadir was 80%. On the final pressure, her oxygen nadir was 91%. Based on the test results I prescribed CPAP therapy for home use.  Today, 07/09/2015: I reviewed her CPAP compliance from 06/05/15 to 07/04/2015, which is a total of 30 days during which time she used her machine every night with percent used days greater than 4 hours at 100%, indicating superb compliance with an average usage of 6 hours and 21 minutes, residual AHI low at 0.2 per hour, leaked low with the 95th percentile at 1.2 L/m on a pressure of 9 cm with EPR of 3. I reviewed her CPAP compliance data from 05/14/2015 through 06/12/2015 which is a total of 30 days during which time she used her machine every day with percent used days greater than 4 hours at 100%, indicating superb compliance with an average usage of 6 hours and 55 minutes, residual AHI low at 0.3 per hour, leak low for the 95th percentile at 3 L/m, pressure of 9 cm with EPR of 3.  Today, 07/09/2015: She reports feeling better. She feels that she is sleeping better and feels better rested in the mornings. Her sleep has been more consolidated and better quality. She has no complaints regarding the machine, the mask, or the pressure. She has not had her flu shot, she is due for her pneumonia shot and she also  inquires about her shingles shot. She has not seen her primary care physician for this yet.  Previously:  02/02/2015: She reports snoring and witnessed apneas while asleep. She has a family history of obstructive sleep apnea as well. She complains of excessive daytime somnolence. Her Epworth sleepiness score is 11 out of 24 today and her fatigue score is 51 out of 63.  Her bedtime is usually late at around 2 AM. She's always gone to bed late even when she worked 2 jobs. She currently works part-time in Microbiologist at  [employer]. Her rise time is around 9 or 10 AM. Typically she will get up at 545 and drink something such as juice and then go back to bed and go back to sleep. She does have nocturia on average 2 times per night. Occasionally she will wake up with a headache. She has been noted to snore which can be loud. Her husband sleeps in a separate bedroom. She lives with her husband and one of her 2 daughters. She has been told that she makes pauses in her breathing and then resumes snoring. One of her daughters has sleep apnea on a CPAP machine. One of her nephews has sleep apnea and also is on a CPAP machine. She is a nonsmoker. She drinks alcohol very rarely. She likes to drink about 2 sodas per day and drinks bottled green tea about 2 or 3 bottles per day. She denies any restless leg symptoms or twitching in her sleep. She has had leg cramps at night. She may not drink enough water. She is trying to lose weight. She has been able to lose a few pounds.   Her Past Medical History Is Significant For: Past Medical History  Diagnosis Date  . Arthritis   . Hypertension   . Allergy     Her Past Surgical History Is Significant For: Past Surgical History  Procedure Laterality Date  . Gallbladder surgery    . Abdominal hysterectomy    . Cholecystectomy    . Breast biopsy      Her Family History Is Significant For: Family History  Problem Relation Age of Onset  . Heart disease Sister   . Stroke Brother   . Colon cancer Neg Hx   . Esophageal cancer Neg Hx   . Rectal cancer Neg Hx   . Stomach cancer Neg Hx     Her Social History Is Significant For: Social History   Social History  . Marital Status: Married    Spouse Name: Sheppard Coil  . Number of Children: 2  . Years of Education: HS   Occupational History  . [employer]    Social History Main Topics  . Smoking status: Never Smoker   . Smokeless tobacco: Never Used  . Alcohol Use: 0.0 oz/week    0 Standard drinks or equivalent per  week     Comment: very seldom  . Drug Use: No  . Sexual Activity: No   Other Topics Concern  . None   Social History Narrative   1-2 sodas a day, daily consumption of tea    Her Allergies Are:  No Known Allergies:   Her Current Medications Are:  Outpatient Encounter Prescriptions as of 07/09/2015  Medication Sig  . albuterol (PROVENTIL HFA;VENTOLIN HFA) 108 (90 BASE) MCG/ACT inhaler Inhale 2 puffs into the lungs every 4 (four) hours as needed for wheezing or shortness of breath (cough, shortness of breath or wheezing.).  Marland Kitchen atenolol-chlorthalidone (TENORETIC) 50-25 MG  tablet   . lisinopril (PRINIVIL,ZESTRIL) 40 MG tablet One daily  . [DISCONTINUED] atenolol-chlorthalidone (TENORETIC) 50-25 MG per tablet One daily for elevated blood pressure   No facility-administered encounter medications on file as of 07/09/2015.  :  Review of Systems:  Out of a complete 14 point review of systems, all are reviewed and negative with the exception of these symptoms as listed below:  Review of Systems  Neurological:       Patient reports that she is doing well on CPAP. No new concerns.     Objective:  Neurologic Exam  Physical Exam Physical Examination:   Filed Vitals:   07/09/15 0833  BP: 122/68  Pulse: 68  Resp: 16    General Examination: The patient is a very pleasant 69 y.o. female in no acute distress. She appears well-developed and well-nourished and well groomed. She is in good spirits today.  HEENT: Normocephalic, atraumatic, pupils are equal, round and reactive to light and accommodation. Extraocular tracking is good without limitation to gaze excursion or nystagmus noted. Normal smooth pursuit is noted. Hearing is grossly intact. Face is symmetric with normal facial animation and normal facial sensation. Speech is clear with no dysarthria noted. There is no hypophonia. There is no lip, neck/head, jaw or voice tremor. Neck is supple with full range of passive and active motion.  There are no carotid bruits on auscultation. Oropharynx exam reveals: mild mouth dryness, adequate dental hygiene and moderate airway crowding, due to wider tongue, thicker soft palate, tonsils are 1+ bilaterally. Mallampati is class II. Tongue protrudes centrally and palate elevates symmetrically. Nasal inspection reveals no significant nasal mucosal bogginess or redness and no septal deviation.   Chest: Clear to auscultation without wheezing, rhonchi or crackles noted.  Heart: S1+S2+0, regular and normal without murmurs, rubs or gallops noted.   Abdomen: Soft, non-tender and non-distended with normal bowel sounds appreciated on auscultation.  Extremities: There is no pitting edema in the distal lower extremities bilaterally. Pedal pulses are intact.  Skin: Warm and dry without trophic changes noted. There are no varicose veins.  Musculoskeletal: exam reveals no obvious joint deformities, tenderness or joint swelling or erythema.   Neurologically:  Mental status: The patient is awake, alert and oriented in all 4 spheres. Her immediate and remote memory, attention, language skills and fund of knowledge are appropriate. There is no evidence of aphasia, agnosia, apraxia or anomia. Speech is clear with normal prosody and enunciation. Thought process is linear. Mood is normal and affect is normal.  Cranial nerves II - XII are as described above under HEENT exam. In addition: shoulder shrug is normal with equal shoulder height noted. Motor exam: Normal bulk, strength and tone is noted. There is no drift, tremor or rebound. Romberg is negative. Reflexes are 2+ throughout. Fine motor skills and coordination: intact with normal finger taps, normal hand movements, normal rapid alternating patting, normal foot taps and normal foot agility.  Cerebellar testing: No dysmetria or intention tremor on finger to nose testing. Heel to shin is difficult for her due to body habitus, L with some knee pain reported.  There is no truncal or gait ataxia.  Sensory exam: intact to light touch in the upper and lower extremities.  Gait, station and balance: She stands easily. No veering to one side is noted. No leaning to one side is noted. Posture is age-appropriate and stance is narrow based. Gait shows normal stride length and normal pace. No problems turning are noted. She turns en bloc. Tandem  walk is slightly difficult for her, unchanged.   Assessment and Plan:   In summary, Miranda [name] is a very pleasant 69 year old female with an underlying medical history of hypertension, allergies, arthritis and obesity, who presents for follow-up consultation of her obstructive sleep apnea, after her recent sleep studies in July and August 2016. She has done well on CPAP therapy at a pressure of 9 cmt. Today, we talked about her test results in detail and also discussed her compliance data in detail. She is congratulated on her excellent treatment compliance. She has noted an improvement in her sleep and daytime somnolence. Her exam is stable. At this juncture, I would like for her to continue with treatment with the current settings. She is encouraged to continue to try to lose weight. Her blood pressure looks good today.  I again explained in particular the risks and ramifications of untreated moderate to severe OSA, especially with respect to developing cardiovascular disease down the Road, including congestive heart failure, difficult to treat hypertension, cardiac arrhythmias, or stroke. Even type 2 diabetes has, in part, been linked to untreated OSA. Symptoms of untreated OSA include daytime sleepiness, memory problems, mood irritability and mood disorder such as depression and anxiety, lack of energy, as well as recurrent headaches, especially morning headaches. We talked about trying to maintain a healthy lifestyle in general, as well as the importance of weight control. I encouraged the patient to eat healthy, exercise  daily and keep well hydrated, to keep a scheduled bedtime and wake time routine, to not skip any meals and eat healthy snacks in between meals. I advised the patient not to drive when feeling sleepy. I recommended the following at this time: Continue using CPAP regularly and continue to strive for weight loss. I explained the importance of being compliant with PAP treatment, not only for insurance purposes but primarily to improve Her symptoms, and for the patient's long term health benefit, including to reduce Her cardiovascular risks. Since she is doing well from my end of things I would like to see her back in 6 months, yearly thereafter if she continues to do well. I answered all her questions today and the patient was in agreement. I encouraged her to call with any interim questions, concerns, problems or updates.  I spent 20 minutes in total face-to-face time with the patient, more than 50% of which was spent in counseling and coordination of care, reviewing test results, reviewing medication and discussing or reviewing the diagnosis of OSA, its prognosis and treatment options.

## 2015-07-09 NOTE — Patient Instructions (Signed)

## 2015-11-02 ENCOUNTER — Other Ambulatory Visit: Payer: Self-pay

## 2015-11-02 DIAGNOSIS — Z1231 Encounter for screening mammogram for malignant neoplasm of breast: Secondary | ICD-10-CM

## 2015-12-17 ENCOUNTER — Ambulatory Visit: Payer: Medicare Other

## 2016-01-07 ENCOUNTER — Ambulatory Visit (INDEPENDENT_AMBULATORY_CARE_PROVIDER_SITE_OTHER): Payer: Medicare Other | Admitting: Neurology

## 2016-01-07 ENCOUNTER — Encounter: Payer: Self-pay | Admitting: Neurology

## 2016-01-07 VITALS — BP 132/80 | HR 78 | Resp 16 | Ht 61.0 in | Wt 238.0 lb

## 2016-01-07 DIAGNOSIS — Z9989 Dependence on other enabling machines and devices: Principal | ICD-10-CM

## 2016-01-07 DIAGNOSIS — G4733 Obstructive sleep apnea (adult) (pediatric): Secondary | ICD-10-CM

## 2016-01-07 NOTE — Progress Notes (Signed)
Subjective:    Patient ID: Miranda [name] is a 70 y.o. female.  HPI    Interim history:   Miranda [name] is a very pleasant 70 year old right-handed woman with an underlying medical history of hypertension, allergies, arthritis and obesity, who presents for follow-up consultation of her obstructive sleep apnea, on treatment with CPAP therapy at home. The patient is unaccompanied today. I last saw her on 07/09/2015, at which time she reported feeling better. She reported that she was sleeping better and felt better rested in the mornings, her sleep was more consolidated and better quality. She was fully compliant with treatment.   Today, 01/07/2016: I reviewed her CPAP compliance data from 12/04/2015 through 01/02/2016 which is a total of 30 days during which time she used her machine every night with percent used days greater than 4 hours at 100%, indicating superb compliance with an average usage of 5 hours and 55 minutes, residual AHI low at 0.2 per hour, leak low for the 95th percentile at 1 L/m on a pressure of 9 cm with EPR of 3.  Today, 01/07/2016: She reports doing well, no recent illness or medication. Weight has been stable. Using CPAP without problems. Sleeping rather well with it. Will order new supplies, DME is [facility].    Previously:  I reviewed her CPAP compliance from 06/05/15 to 07/04/2015, which is a total of 30 days during which time she used her machine every night with percent used days greater than 4 hours at 100%, indicating superb compliance with an average usage of 6 hours and 21 minutes, residual AHI low at 0.2 per hour, leaked low with the 95th percentile at 1.2 L/m on a pressure of 9 cm with EPR of 3. I reviewed her CPAP compliance data from 05/14/2015 through 06/12/2015 which is a total of 30 days during which time she used her machine every day with percent used days greater than 4 hours at 100%, indicating superb compliance with an average usage of 6 hours and 55 minutes,  residual AHI low at 0.3 per hour, leak low for the 95th percentile at 3 L/m, pressure of 9 cm with EPR of 3.  Of note, the patient no showed for appointment on 06/18/2015. I first met her on 02/02/2015 at the request of her primary care physician, at which time she reported snoring, witnessed apneas, and excessive daytime somnolence. I asked her to return for sleep study. She had a baseline sleep study, followed by a CPAP titration study and I went over her test results with her in detail today. Her baseline sleep study from 03/12/2015 showed a sleep efficiency of 63.4% with a sleep latency of 29 minutes and wake after sleep onset of 146.5 minutes with moderate sleep fragmentation noted. She had an elevated arousal index. She had slow-wave sleep at 22.6%, REM sleep at 13.6% with a mildly prolonged REM latency. She had no significant PLMS, EKG or EEG changes. She had mild to moderate snoring. She had a total AHI of 8.9 per hour, rising to 47.7 per hour during REM sleep at 15.8 per hour in the supine position. Baseline oxygen saturation was 96%, nadir was 81%. Time below 88% saturation was 5 minutes and 28 seconds. Based on her sleep-related concerns and her baseline sleep test results I invited her back for a CPAP titration study. She had this on 04/12/2015. Sleep efficiency was 85%. Sleep latency was 20 minutes and wake after sleep onset was 28 minutes with moderate, then mild sleep fragmentation noted.  She had an increased percentage of stage II sleep. She had a decreased percentage of slow-wave sleep and a near normal percentage of REM sleep. She had no significant PLMS. She had a normal REM latency. She had no significant EEG or EKG changes. Snoring was eliminated. CPAP was titrated from 5 cm to 9 cm. AHI was 0 per hour and the final pressure. Average oxygen saturation was 95%, nadir was 80%. On the final pressure, her oxygen nadir was 91%. Based on the test results I prescribed CPAP therapy for home use.  I  reviewed her CPAP compliance from 06/05/15 to 07/04/2015, which is a total of 30 days during which time she used her machine every night with percent used days greater than 4 hours at 100%, indicating superb compliance with an average usage of 6 hours and 21 minutes, residual AHI low at 0.2 per hour, leaked low with the 95th percentile at 1.2 L/m on a pressure of 9 cm with EPR of 3.  I reviewed her CPAP compliance data from 05/14/2015 through 06/12/2015 which is a total of 30 days during which time she used her machine every day with percent used days greater than 4 hours at 100%, indicating superb compliance with an average usage of 6 hours and 55 minutes, residual AHI low at 0.3 per hour, leak low for the 95th percentile at 3 L/m, pressure of 9 cm with EPR of 3.  02/02/2015: She reports snoring and witnessed apneas while asleep. She has a family history of obstructive sleep apnea as well. She complains of excessive daytime somnolence. Her Epworth sleepiness score is 11 out of 24 today and her fatigue score is 51 out of 63.  Her bedtime is usually late at around 2 AM. She's always gone to bed late even when she worked 2 jobs. 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 Her rise time is around 9 or 10 AM. Typically she will get up at 545 and drink something such as juice and then go back to bed and go back to sleep. She does have nocturia on average 2 times per night. Occasionally she will wake up with a headache. She has been noted to snore which can be loud. Her husband sleeps in a separate bedroom. She lives with her husband and one of her 2 daughters. She has been told that she makes pauses in her breathing and then resumes snoring. One of her daughters has sleep apnea on a CPAP machine. One of her nephews has sleep apnea and also is on a CPAP machine. She is a nonsmoker. She drinks alcohol very rarely. She likes to drink about 2 sodas per day and drinks bottled green tea  about 2 or 3 bottles per day. She denies any restless leg symptoms or twitching in her sleep. She has had leg cramps at night. She may not drink enough water. She is trying to lose weight. She has been able to lose a few pounds.  Her Past Medical History Is Significant For: Past Medical History  Diagnosis Date  . Arthritis   . Hypertension   . Allergy     Her Past Surgical History Is Significant For: Past Surgical History  Procedure Laterality Date  . Gallbladder surgery    . Abdominal hysterectomy    . Cholecystectomy    . Breast biopsy      Her Family History Is Significant For: Family History  Problem Relation Age of Onset  . Heart disease  Sister   . Stroke Brother   . Colon cancer Neg Hx   . Esophageal cancer Neg Hx   . Rectal cancer Neg Hx   . Stomach cancer Neg Hx     Her Social History Is Significant For: Social History   Social History  . Marital Status: Married    Spouse Name: Sheppard Coil  . Number of Children: 2  . Years of Education: HS   Occupational History  . [employer]    Social History Main Topics  . Smoking status: Never Smoker   . Smokeless tobacco: Never Used  . Alcohol Use: 0.0 oz/week    0 Standard drinks or equivalent per week     Comment: very seldom  . Drug Use: No  . Sexual Activity: No   Other Topics Concern  . None   Social History Narrative   1-2 sodas a day, daily consumption of tea    Her Allergies Are:  No Known Allergies:   Her Current Medications Are:  Outpatient Encounter Prescriptions as of 01/07/2016  Medication Sig  . albuterol (PROVENTIL HFA;VENTOLIN HFA) 108 (90 BASE) MCG/ACT inhaler Inhale 2 puffs into the lungs every 4 (four) hours as needed for wheezing or shortness of breath (cough, shortness of breath or wheezing.).  Marland Kitchen atenolol-chlorthalidone (TENORETIC) 50-25 MG tablet   . lisinopril (PRINIVIL,ZESTRIL) 40 MG tablet One daily   No facility-administered encounter medications on file as of 01/07/2016.   :  Review of Systems:  Out of a complete 14 point review of systems, all are reviewed and negative with the exception of these symptoms as listed below:   Review of Systems  Neurological:       Patient states that she is doing well with CPAP, no new concerns.     Objective:  Neurologic Exam  Physical Exam Physical Examination:   Filed Vitals:   01/07/16 0815  BP: 132/80  Pulse: 78  Resp: 16    General Examination: The patient is a very pleasant 70 y.o. female in no acute distress. She appears well-developed and well-nourished and well groomed. She is in good spirits today.  HEENT: Normocephalic, atraumatic, pupils are equal, round and reactive to light and accommodation. Extraocular tracking is good without limitation to gaze excursion or nystagmus noted. Normal smooth pursuit is noted. Hearing is grossly intact. Face is symmetric with normal facial animation and normal facial sensation. Speech is clear with no dysarthria noted. There is no hypophonia. There is no lip, neck/head, jaw or voice tremor. Neck is supple with full range of passive and active motion. There are no carotid bruits on auscultation. Oropharynx exam reveals: mild mouth dryness, adequate dental hygiene and moderate airway crowding, due to wider tongue, thicker soft palate, tonsils are 1+ bilaterally. Mallampati is class II. Tongue protrudes centrally and palate elevates symmetrically. Nasal inspection reveals no significant nasal mucosal bogginess or redness and no septal deviation.   Chest: Clear to auscultation without wheezing, rhonchi or crackles noted.  Heart: S1+S2+0, regular and normal without murmurs, rubs or gallops noted.   Abdomen: Soft, non-tender and non-distended with normal bowel sounds appreciated on auscultation.  Extremities: There is no pitting edema in the distal lower extremities bilaterally. Pedal pulses are intact.  Skin: Warm and dry without trophic changes noted. There are no varicose  veins.  Musculoskeletal: exam reveals no obvious joint deformities, tenderness or joint swelling or erythema.   Neurologically:  Mental status: The patient is awake, alert and oriented in all 4 spheres. Her immediate  and remote memory, attention, language skills and fund of knowledge are appropriate. There is no evidence of aphasia, agnosia, apraxia or anomia. Speech is clear with normal prosody and enunciation. Thought process is linear. Mood is normal and affect is normal.  Cranial nerves II - XII are as described above under HEENT exam. In addition: shoulder shrug is normal with equal shoulder height noted. Motor exam: Normal bulk, strength and tone is noted. There is no drift, tremor or rebound. Romberg is negative. Reflexes are 2+ throughout. Fine motor skills and coordination: intact with normal finger taps, normal hand movements, normal rapid alternating patting, normal foot taps and normal foot agility.  Cerebellar testing: No dysmetria or intention tremor on finger to nose testing. Heel to shin is difficult for her due to body habitus, L with some knee pain reported, fell on it years ago, she says. There is no truncal or gait ataxia.  Sensory exam: intact to light touch in the upper and lower extremities.  Gait, station and balance: She stands easily. No veering to one side is noted. No leaning to one side is noted. Posture is age-appropriate and stance is narrow based. Gait shows normal stride length and normal pace. No problems turning are noted. She turns en bloc. Tandem walk is good today.   Assessment and Plan:   In summary, Miranda [name] is a very pleasant 70 year old female with an underlying medical history of hypertension, allergies, arthritis and obesity, who presents for follow-up consultation of her obstructive sleep apnea, on treatment with CPAP at 9 cm pressure. She is fully compliant with treatment and continues to endorse good results. She is commended for treatment adherence.  We reviewed briefly her baseline sleep study results from 03/12/2015 and her CPAP titration results from 04/12/2015. We also reviewed her most recent compliance data. Her physical exam is stable. She is encouraged to try to work on weight loss. We talked about the risks and ramifications of untreated moderate to severe OSA, especially with respect to developing cardiovascular disease down the Road, including congestive heart failure, difficult to treat hypertension, cardiac arrhythmias, or stroke. Even type 2 diabetes has, in part, been linked to untreated OSA.  I recommended the following at this time: Continue using CPAP regularly and continue to strive for weight loss. I explained the importance of being compliant with PAP treatment, not only for insurance purposes but primarily to improve Her symptoms, and for the patient's long term health benefit, including to reduce Her cardiovascular risks. Since she is doing well from my end of things I would like to see her back in one year. I answered all her questions today and the patient was in agreement. I encouraged her to call with any interim questions, concerns, problems or updates.  I spent 25 minutes in total face-to-face time with the patient, more than 50% of which was spent in counseling and coordination of care, reviewing test results, reviewing medication and discussing or reviewing the diagnosis of OSA, its prognosis and treatment options.

## 2016-01-07 NOTE — Patient Instructions (Addendum)
Please continue using your CPAP regularly. While your insurance requires that you use CPAP at least 4 hours each night on 70% of the nights, I recommend, that you not skip any nights and use it throughout the night if you can. Getting used to CPAP and staying with the treatment long term does take time and patience and discipline. Untreated obstructive sleep apnea when it is moderate to severe can have an adverse impact on cardiovascular health and raise her risk for heart disease, arrhythmias, hypertension, congestive heart failure, stroke and diabetes. Untreated obstructive sleep apnea causes sleep disruption, nonrestorative sleep, and sleep deprivation. This can have an impact on your day to day functioning and cause daytime sleepiness and impairment of cognitive function, memory loss, mood disturbance, and problems focussing. Using CPAP regularly can improve these symptoms.  Keep up the good work! I will see you back in 12 months for sleep apnea check up.    Work hard on weight loss.

## 2016-01-08 ENCOUNTER — Telehealth: Payer: Self-pay | Admitting: Family Medicine

## 2016-01-08 NOTE — Telephone Encounter (Signed)
ERROR

## 2016-01-28 ENCOUNTER — Ambulatory Visit
Admission: RE | Admit: 2016-01-28 | Discharge: 2016-01-28 | Disposition: A | Discharge status: Home / Self Care | Payer: Medicare Other | Source: Ambulatory Visit

## 2016-01-28 DIAGNOSIS — Z1231 Encounter for screening mammogram for malignant neoplasm of breast: Secondary | ICD-10-CM

## 2016-03-14 ENCOUNTER — Ambulatory Visit (INDEPENDENT_AMBULATORY_CARE_PROVIDER_SITE_OTHER): Payer: Medicare Other | Admitting: Physician Assistant

## 2016-03-14 VITALS — BP 136/82 | HR 67 | Temp 97.9°F | Resp 16 | Ht 61.0 in | Wt 239.0 lb

## 2016-03-14 DIAGNOSIS — Z1159 Encounter for screening for other viral diseases: Secondary | ICD-10-CM

## 2016-03-14 DIAGNOSIS — I10 Essential (primary) hypertension: Secondary | ICD-10-CM

## 2016-03-14 DIAGNOSIS — R7303 Prediabetes: Secondary | ICD-10-CM

## 2016-03-14 DIAGNOSIS — R05 Cough: Secondary | ICD-10-CM | POA: Diagnosis not present

## 2016-03-14 DIAGNOSIS — Z6841 Body Mass Index (BMI) 40.0 and over, adult: Secondary | ICD-10-CM | POA: Insufficient documentation

## 2016-03-14 DIAGNOSIS — J0100 Acute maxillary sinusitis, unspecified: Secondary | ICD-10-CM

## 2016-03-14 DIAGNOSIS — Z23 Encounter for immunization: Secondary | ICD-10-CM | POA: Diagnosis not present

## 2016-03-14 DIAGNOSIS — R059 Cough, unspecified: Secondary | ICD-10-CM

## 2016-03-14 LAB — CBC WITH DIFFERENTIAL/PLATELET
BASOS PCT: 0 %
Basophils Absolute: 0 cells/uL (ref 0–200)
EOS ABS: 396 {cells}/uL (ref 15–500)
Eosinophils Relative: 6 %
HEMATOCRIT: 35.1 % (ref 35.0–45.0)
HEMOGLOBIN: 11.4 g/dL — AB (ref 11.7–15.5)
LYMPHS PCT: 27 %
Lymphs Abs: 1782 cells/uL (ref 850–3900)
MCH: 24.5 pg — ABNORMAL LOW (ref 27.0–33.0)
MCHC: 32.5 g/dL (ref 32.0–36.0)
MCV: 75.3 fL — AB (ref 80.0–100.0)
MONO ABS: 330 {cells}/uL (ref 200–950)
MPV: 9.5 fL (ref 7.5–12.5)
Monocytes Relative: 5 %
NEUTROS PCT: 62 %
Neutro Abs: 4092 cells/uL (ref 1500–7800)
Platelets: 254 10*3/uL (ref 140–400)
RBC: 4.66 MIL/uL (ref 3.80–5.10)
RDW: 15.9 % — AB (ref 11.0–15.0)
WBC: 6.6 10*3/uL (ref 3.8–10.8)

## 2016-03-14 LAB — COMPREHENSIVE METABOLIC PANEL
ALT: 9 U/L (ref 6–29)
AST: 15 U/L (ref 10–35)
Albumin: 3.6 g/dL (ref 3.6–5.1)
Alkaline Phosphatase: 107 U/L (ref 33–130)
BUN: 16 mg/dL (ref 7–25)
CHLORIDE: 103 mmol/L (ref 98–110)
CO2: 30 mmol/L (ref 20–31)
Calcium: 8.7 mg/dL (ref 8.6–10.4)
Creat: 1.18 mg/dL — ABNORMAL HIGH (ref 0.50–0.99)
GLUCOSE: 102 mg/dL — AB (ref 65–99)
POTASSIUM: 3.8 mmol/L (ref 3.5–5.3)
Sodium: 142 mmol/L (ref 135–146)
Total Bilirubin: 0.4 mg/dL (ref 0.2–1.2)
Total Protein: 6.9 g/dL (ref 6.1–8.1)

## 2016-03-14 LAB — LIPID PANEL
Cholesterol: 166 mg/dL (ref 125–200)
HDL: 75 mg/dL (ref >=46)
LDL CALC: 77 mg/dL (ref <130)
TRIGLYCERIDES: 72 mg/dL (ref <150)
Total CHOL/HDL Ratio: 2.2 Ratio (ref <=5.0)
VLDL: 14 mg/dL (ref <30)

## 2016-03-14 LAB — TSH: TSH: 3.03 mIU/L

## 2016-03-14 LAB — HEMOGLOBIN A1C
Hgb A1c MFr Bld: 6.1 % — ABNORMAL HIGH (ref <5.7)
Mean Plasma Glucose: 128 mg/dL

## 2016-03-14 MED ORDER — ATENOLOL-CHLORTHALIDONE 50-25 MG PO TABS: 1.0000 | ORAL_TABLET | Freq: Every day | ORAL | Status: DC

## 2016-03-14 MED ORDER — GUAIFENESIN ER 1200 MG PO TB12: 1.0000 | ORAL_TABLET | Freq: Two times a day (BID) | ORAL | Status: DC | PRN

## 2016-03-14 MED ORDER — ZOSTER VACCINE LIVE 19400 UNT/0.65ML ~~LOC~~ SUSR: 0.6500 mL | Freq: Once | SUBCUTANEOUS | Status: DC

## 2016-03-14 MED ORDER — BENZONATATE 100 MG PO CAPS: 100.0000 mg | ORAL_CAPSULE | Freq: Three times a day (TID) | ORAL | Status: DC | PRN

## 2016-03-14 MED ORDER — LISINOPRIL 40 MG PO TABS: ORAL_TABLET | ORAL | Status: DC

## 2016-03-14 MED ORDER — AMOXICILLIN-POT CLAVULANATE 875-125 MG PO TABS: 1.0000 | ORAL_TABLET | Freq: Two times a day (BID) | ORAL | Status: AC

## 2016-03-14 MED ORDER — ALBUTEROL SULFATE HFA 108 (90 BASE) MCG/ACT IN AERS: 2.0000 | INHALATION_SPRAY | RESPIRATORY_TRACT | Status: DC | PRN

## 2016-03-14 NOTE — Progress Notes (Signed)
Patient ID: Miranda [name], female    DOB: June 12, 1946, 70 y.o.   MRN: [id number]  PCP: Kennon Portela, MD  Subjective:   Chief Complaint  Patient presents with  . Sinusitis  . itchy throat  . itchy ears  . Wheezing  . Cough    non productive    HPI Presents for evaluation of possible sinusitis and medication refills for HTN.  Symptoms >1 week. Started with the cough. "I've coughed so much that my hernia has popped out." "Then I noticed that I had the wheezing, and now the headaches." "If I could just reach inside my ear I'd feel so much better." No sore throat. Feels lots of sinus drainage, but not particularly stuffy. Drainage is malodorous and tastes awful. No fever, chills, nausea, vomiting. Non-productive cough. Rescue inhaler helps sometimes at night. Feels burning sensation in the chest (rubs her hand over the upper center of the chest).  No CP, SOB, dizziness. No rash.  Tolerating her current medications without adverse effects.  Outstanding health maintenance items noted and discussed.    Review of Systems As above.    Patient Active Problem List   Diagnosis Date Noted  . BMI 45.0-49.9, adult ([place]) 03/14/2016  . Pre-diabetes 02/28/2015  . Allergic rhinitis 12/16/2014  . Snoring 12/16/2014  . HTN (hypertension) 01/17/2012  . Obesity 01/17/2012     Prior to Admission medications   Medication Sig Start Date End Date Taking? Authorizing Provider  albuterol (PROVENTIL HFA;VENTOLIN HFA) 108 (90 BASE) MCG/ACT inhaler Inhale 2 puffs into the lungs every 4 (four) hours as needed for wheezing or shortness of breath (cough, shortness of breath or wheezing.). 12/16/14  Yes Darlyne Russian, MD  atenolol-chlorthalidone (TENORETIC) 50-25 MG tablet  06/15/15  Yes Historical Provider, MD  lisinopril (PRINIVIL,ZESTRIL) 40 MG tablet One daily 02/27/15  Yes Gay Filler Copland, MD     No Known Allergies     Objective:  Physical Exam  Constitutional: She is  oriented to person, place, and time. She appears well-developed and well-nourished. She is active and cooperative. No distress.  BP 136/82 mmHg  Pulse 67  Temp(Src) 97.9 F (36.6 C) (Oral)  Resp 16  Ht 5\' 1"  (1.549 m)  Wt 239 lb (108.41 kg)  BMI 45.18 kg/m2  SpO2 98%  HENT:  Head: Normocephalic and atraumatic.  Right Ear: Hearing, tympanic membrane, external ear and ear canal normal.  Left Ear: Hearing, tympanic membrane, external ear and ear canal normal.  Nose: Mucosal edema (mild) present. Right sinus exhibits maxillary sinus tenderness. Right sinus exhibits no frontal sinus tenderness. Left sinus exhibits maxillary sinus tenderness. Left sinus exhibits no frontal sinus tenderness.  Mouth/Throat: Uvula is midline, oropharynx is clear and moist and mucous membranes are normal. No oral lesions. No uvula swelling.  Eyes: Conjunctivae are normal. No scleral icterus.  Neck: Normal range of motion, full passive range of motion without pain and phonation normal. Neck supple. No thyromegaly present.  Cardiovascular: Normal rate, regular rhythm and normal heart sounds.   Pulses:      Radial pulses are 2+ on the right side, and 2+ on the left side.  Pulmonary/Chest: Effort normal. She has wheezes (resolve with coughing).  Lymphadenopathy:       Head (right side): No tonsillar, no preauricular, no posterior auricular and no occipital adenopathy present.       Head (left side): No tonsillar, no preauricular, no posterior auricular and no occipital adenopathy present.    She has no  cervical adenopathy.       Right: No supraclavicular adenopathy present.       Left: No supraclavicular adenopathy present.  Neurological: She is alert and oriented to person, place, and time. No sensory deficit.  Skin: Skin is warm, dry and intact. No rash noted. No cyanosis or erythema. Nails show no clubbing.  Psychiatric: She has a normal mood and affect. Her speech is normal and behavior is normal.            Assessment & Plan:   1. Acute maxillary sinusitis, recurrence not specified Supportive care. Anticipatory guidance. - amoxicillin-clavulanate (AUGMENTIN) 875-125 MG tablet; Take 1 tablet by mouth 2 (two) times daily.  Dispense: 20 tablet; Refill: 0  2. Cough Due to drainage secondary to #1. - albuterol (PROVENTIL HFA;VENTOLIN HFA) 108 (90 Base) MCG/ACT inhaler; Inhale 2 puffs into the lungs every 4 (four) hours as needed for wheezing or shortness of breath (cough, shortness of breath or wheezing.).  Dispense: 1 Inhaler; Refill: 1 - benzonatate (TESSALON) 100 MG capsule; Take 1-2 capsules (100-200 mg total) by mouth 3 (three) times daily as needed for cough.  Dispense: 40 capsule; Refill: 0 - Guaifenesin (MUCINEX MAXIMUM STRENGTH) 1200 MG TB12; Take 1 tablet (1,200 mg total) by mouth every 12 (twelve) hours as needed.  Dispense: 14 tablet; Refill: 1  3. Essential hypertension Controlled. Continue current treatment. RTC in 6 months. - lisinopril (PRINIVIL,ZESTRIL) 40 MG tablet; One daily  Dispense: 90 tablet; Refill: 3 - atenolol-chlorthalidone (TENORETIC) 50-25 MG tablet; Take 1 tablet by mouth daily.  Dispense: 90 tablet; Refill: 3 - Comprehensive metabolic panel - Lipid panel - TSH - CBC with Differential/Platelet  4. Pre-diabetes Await lab results. - Hemoglobin A1c - Comprehensive metabolic panel  5. Need for hepatitis C screening test - Hepatitis C antibody  6. Need for Tdap vaccination She will return when well. - Tdap vaccine greater than or equal to 7yo IM; Future  7. Need for pneumococcal vaccination She will return when well. - Pneumococcal conjugate vaccine 13-valent IM; Future  8. Need for shingles vaccine She will obtain this at her local pharmacy when she is well. - Zoster Vaccine Live, PF, (ZOSTAVAX) 96295 UNT/0.65ML injection; Inject 19,400 Units into the skin once.  Dispense: 0.65 mL; Refill: 0   Fara Chute, PA-C Physician Assistant-Certified [facility]

## 2016-03-14 NOTE — Patient Instructions (Addendum)
When you are well, please get the shingles vaccine at your pharmacy and return here for the Tdap and Prevnar-13 vaccines.    IF you received an x-ray today, you will receive an invoice from [facility]. Please contact [facility] at [phone] with questions or concerns regarding your invoice.   IF you received labwork today, you will receive an invoice from [facility]. Please contact [facility] at [phone] with questions or concerns regarding your invoice.   Our billing staff will not be able to assist you with questions regarding bills from these companies.  You will be contacted with the lab results as soon as they are available. The fastest way to get your results is to activate your My Chart account. Instructions are located on the last page of this paperwork. If you have not heard from [place] regarding the results in 2 weeks, please contact this office.

## 2016-03-15 LAB — HEPATITIS C ANTIBODY: HCV Ab: NEGATIVE

## 2016-03-23 ENCOUNTER — Encounter: Payer: Self-pay | Admitting: Physician Assistant

## 2016-04-12 ENCOUNTER — Encounter: Payer: Self-pay | Admitting: Family Medicine

## 2016-04-12 ENCOUNTER — Ambulatory Visit (INDEPENDENT_AMBULATORY_CARE_PROVIDER_SITE_OTHER): Payer: Medicare Other | Admitting: Family Medicine

## 2016-04-12 VITALS — BP 144/74 | HR 72 | Temp 97.8°F | Resp 16 | Ht 60.75 in | Wt 234.2 lb

## 2016-04-12 DIAGNOSIS — J45909 Unspecified asthma, uncomplicated: Secondary | ICD-10-CM | POA: Diagnosis not present

## 2016-04-12 DIAGNOSIS — R05 Cough: Secondary | ICD-10-CM | POA: Diagnosis not present

## 2016-04-12 DIAGNOSIS — R059 Cough, unspecified: Secondary | ICD-10-CM

## 2016-04-12 MED ORDER — PREDNISONE 20 MG PO TABS: ORAL_TABLET | ORAL | 1 refills | Status: DC

## 2016-04-12 MED ORDER — ALBUTEROL SULFATE HFA 108 (90 BASE) MCG/ACT IN AERS: 2.0000 | INHALATION_SPRAY | RESPIRATORY_TRACT | 1 refills | Status: DC | PRN

## 2016-04-12 MED ORDER — BENZONATATE 100 MG PO CAPS: 100.0000 mg | ORAL_CAPSULE | Freq: Three times a day (TID) | ORAL | 0 refills | Status: DC | PRN

## 2016-04-12 NOTE — Patient Instructions (Addendum)
  Please return if you don't see improvement by Monday.   IF you received an x-ray today, you will receive an invoice from [facility]. Please contact [facility] at [phone] with questions or concerns regarding your invoice.   IF you received labwork today, you will receive an invoice from [facility] [employer]. Please contact [facility] at [phone] with questions or concerns regarding your invoice.   Our billing staff will not be able to assist you with questions regarding bills from these companies.  You will be contacted with the lab results as soon as they are available. The fastest way to get your results is to activate your My Chart account. Instructions are located on the last page of this paperwork. If you have not heard from [place] regarding the results in 2 weeks, please contact this office.

## 2016-04-12 NOTE — Progress Notes (Signed)
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  She has had a productive cough for over a month.  She was seen one month ago for a sinus infection and cough, and the sinus infection seems to have cleared with the antibiotic.    She does get winded with short walks.  Other problems include OSA, prediabetes.  Objective:  Audible wheezes. BP (!) 144/74 (BP Location: Right Arm, Patient Position: Sitting, Cuff Size: Large)   Pulse 72   Temp 97.8 F (36.6 C) (Oral)   Resp 16   Ht 5' 0.75" (1.543 m)   Wt 234 lb 3.2 oz (106.2 kg)   SpO2 95%   BMI 44.62 kg/m  HEENT:  Unremarkable Neck: supple Chest:  Bilateral insp and exp wheezes. Heart:  Reg, no murmur Ext:  Trace edema Skin:  No obvious rash  Assessment: Acute bronchitis with reactive airways  Plan: Mild reactive airways disease - Plan: predniSONE (DELTASONE) 20 MG tablet continue the inhaler.  Robyn Haber, MD

## 2016-09-08 ENCOUNTER — Encounter: Payer: Self-pay | Admitting: Physician Assistant

## 2016-09-08 ENCOUNTER — Ambulatory Visit (INDEPENDENT_AMBULATORY_CARE_PROVIDER_SITE_OTHER): Payer: Medicare Other | Admitting: Physician Assistant

## 2016-09-08 VITALS — BP 132/75 | HR 55 | Temp 97.9°F | Resp 16 | Ht 60.08 in | Wt 233.4 lb

## 2016-09-08 DIAGNOSIS — R609 Edema, unspecified: Secondary | ICD-10-CM | POA: Diagnosis not present

## 2016-09-08 DIAGNOSIS — H9202 Otalgia, left ear: Secondary | ICD-10-CM

## 2016-09-08 DIAGNOSIS — R600 Localized edema: Secondary | ICD-10-CM

## 2016-09-08 DIAGNOSIS — R221 Localized swelling, mass and lump, neck: Secondary | ICD-10-CM

## 2016-09-08 NOTE — Progress Notes (Signed)
Miranda [name]  MRN: [id number] DOB: May 17, 1946  PCP: Kennon Portela, MD  Subjective:  Pt is a 71 year old female PMH HTN, pre-diabetes, obesity who presents to clinic for neck swelling. Yesterday she had minor left ear pain. No drainage, decreased hearing, ringing of ears, vertigo, dizziness. This morning she woke up and the left side of her lower left jaw was swollen and tender. Since this morning, her swelling and tenderness has improved, however it is not completely gone and this is concerning to her.  No known sick contacts. Has not taken anything for this. Denies tooth pain, swelling of mouth with eating, sore throat, difficulty swallowing, pain, SOB, wheezing, difficulty breathing.   Review of Systems  Constitutional: Negative for chills, diaphoresis, fatigue and fever.  HENT: Positive for ear pain and facial swelling. Negative for congestion, dental problem, ear discharge, mouth sores, nosebleeds, postnasal drip, rhinorrhea, sinus pain, sinus pressure, sneezing, sore throat, tinnitus and trouble swallowing.   Respiratory: Negative for cough, chest tightness, shortness of breath and wheezing.   Cardiovascular: Negative for chest pain and palpitations.  Gastrointestinal: Negative for abdominal pain, diarrhea, nausea and vomiting.  Neurological: Negative for weakness, light-headedness and headaches.    Patient Active Problem List   Diagnosis Date Noted  . BMI 45.0-49.9, adult ([place]) 03/14/2016  . Pre-diabetes 02/28/2015  . Allergic rhinitis 12/16/2014  . Snoring 12/16/2014  . HTN (hypertension) 01/17/2012    Current Outpatient Prescriptions on File Prior to Visit  Medication Sig Dispense Refill  . albuterol (PROVENTIL HFA;VENTOLIN HFA) 108 (90 Base) MCG/ACT inhaler Inhale 2 puffs into the lungs every 4 (four) hours as needed for wheezing or shortness of breath (cough, shortness of breath or wheezing.). 1 Inhaler 1  . atenolol-chlorthalidone (TENORETIC) 50-25 MG tablet Take 1  tablet by mouth daily. 90 tablet 3  . lisinopril (PRINIVIL,ZESTRIL) 40 MG tablet One daily 90 tablet 3  . Zoster Vaccine Live, PF, (ZOSTAVAX) 60454 UNT/0.65ML injection Inject 19,400 Units into the skin once. (Patient not taking: Reported on 09/08/2016) 0.65 mL 0   No current facility-administered medications on file prior to visit.     No Known Allergies   Objective:  BP 132/75 (BP Location: Right Arm, Patient Position: Sitting, Cuff Size: Large)   Pulse (!) 55   Temp 97.9 F (36.6 C) (Oral)   Resp 16   Ht 5' 0.08" (1.526 m)   Wt 233 lb 6.4 oz (105.9 kg)   SpO2 98%   BMI 45.47 kg/m   Physical Exam  Constitutional: She is oriented to person, place, and time and well-developed, well-nourished, and in no distress. No distress.  HENT:  Right Ear: Tympanic membrane and ear canal normal.  Left Ear: Tympanic membrane and ear canal normal.  Nose: Mucosal edema present. No rhinorrhea. Right sinus exhibits no maxillary sinus tenderness and no frontal sinus tenderness. Left sinus exhibits no maxillary sinus tenderness and no frontal sinus tenderness.  Mouth/Throat: Oropharynx is clear and moist and mucous membranes are normal. No oral lesions. Normal dentition. No dental abscesses.  Cardiovascular: Normal rate, regular rhythm and normal heart sounds.   Lymphadenopathy:       Head (right side): No submental, no submandibular, no preauricular and no posterior auricular adenopathy present.       Head (left side): Submandibular (Mildly TTP) adenopathy present. No submental, no preauricular and no posterior auricular adenopathy present.    She has no cervical adenopathy.  Neurological: She is alert and oriented to person, place, and time.  GCS score is 15.  Skin: Skin is warm and dry.  Psychiatric: Mood, memory, affect and judgment normal.  Vitals reviewed.   Assessment and Plan :  1. Submandibular gland swelling 2. Left ear pain 3. Neck swelling - Pt symptoms are rapidly improving, this is  reassuring. Suspect recent exposure to viral illness. Supportive care: Drink plenty of fluids. RTC if no improvement or worsening symptoms in 3-5 days.   Mercer Pod, PA-C  [facility] 09/08/2016 10:34 AM

## 2016-09-08 NOTE — Patient Instructions (Signed)
     IF you received an x-ray today, you will receive an invoice from [facility]. Please contact [facility] at [phone] with questions or concerns regarding your invoice.   IF you received labwork today, you will receive an invoice from [facility]. Please contact [facility] at [phone] with questions or concerns regarding your invoice.   Our billing staff will not be able to assist you with questions regarding bills from these companies.  You will be contacted with the lab results as soon as they are available. The fastest way to get your results is to activate your My Chart account. Instructions are located on the last page of this paperwork. If you have not heard from us regarding the results in 2 weeks, please contact this office.     

## 2017-01-06 ENCOUNTER — Ambulatory Visit (INDEPENDENT_AMBULATORY_CARE_PROVIDER_SITE_OTHER): Payer: Medicare Other | Admitting: Neurology

## 2017-01-06 ENCOUNTER — Encounter: Payer: Self-pay | Admitting: Neurology

## 2017-01-06 VITALS — BP 112/78 | HR 82 | Resp 16 | Ht 60.75 in | Wt 240.0 lb

## 2017-01-06 DIAGNOSIS — G4733 Obstructive sleep apnea (adult) (pediatric): Secondary | ICD-10-CM | POA: Diagnosis not present

## 2017-01-06 DIAGNOSIS — Z9989 Dependence on other enabling machines and devices: Secondary | ICD-10-CM

## 2017-01-06 DIAGNOSIS — R635 Abnormal weight gain: Secondary | ICD-10-CM

## 2017-01-06 NOTE — Patient Instructions (Signed)

## 2017-01-06 NOTE — Progress Notes (Signed)
Subjective:    Patient ID: Miranda [name] is a 71 y.o. female.  HPI     Interim history:    Miranda [name] is a very pleasant 71 year old right-handed woman with an underlying medical history of hypertension, allergies, arthritis and obesity, who presents for follow-up consultation of her obstructive sleep apnea, on treatment with CPAP therapy at home. The patient is unaccompanied today. I last saw her on 01/07/2016, at which time she reported doing well, fully compliant with CPAP therapy and slept rather well with it. She needed new supplies which I ordered. She was commended for her treatment adherence and advised to follow-up in one year.   Today, 01/06/2017 (all dictated new, as well as above notes, some dictation done in note pad or Word, outside of chart, may appear as copied):   I reviewed her CPAP compliance data from 12/06/2016 through 01/04/2017 which is a total of 30 days, during which time she used her machine every night with percent used days greater than 4 hours at 100%, indicating superb compliance with an average usage of 5 hours and 25 minutes, residual AHI low at 0.1 per hour, leak low with the 95th percentile at 2.6 L/m on a pressure of 9 cm with EPR of 3. She reports doing well, no recent changes to medical Hx of meds. Some occasional leg cramps, but may not be drinking enough water. Low salt diet. Some Sx of R meralgia paresthetica, has had a little weight gain. Fluctuating weight.    The patient's allergies, current medications, family history, past medical history, past social history, past surgical history and problem list were reviewed and updated as appropriate.   Previously (copied from previous notes for reference):   I saw her on 07/09/2015, at which time she reported feeling better. She reported that she was sleeping better and felt better rested in the mornings, her sleep was more consolidated and better quality. She was fully compliant with treatment.    I reviewed her  CPAP compliance data from 12/04/2015 through 01/02/2016 which is a total of 30 days during which time she used her machine every night with percent used days greater than 4 hours at 100%, indicating superb compliance with an average usage of 5 hours and 55 minutes, residual AHI low at 0.2 per hour, leak low for the 95th percentile at 1 L/m on a pressure of 9 cm with EPR of 3.   I reviewed her CPAP compliance from 06/05/15 to 07/04/2015, which is a total of 30 days during which time she used her machine every night with percent used days greater than 4 hours at 100%, indicating superb compliance with an average usage of 6 hours and 21 minutes, residual AHI low at 0.2 per hour, leaked low with the 95th percentile at 1.2 L/m on a pressure of 9 cm with EPR of 3. I reviewed her CPAP compliance data from 05/14/2015 through 06/12/2015 which is a total of 30 days during which time she used her machine every day with percent used days greater than 4 hours at 100%, indicating superb compliance with an average usage of 6 hours and 55 minutes, residual AHI low at 0.3 per hour, leak low for the 95th percentile at 3 L/m, pressure of 9 cm with EPR of 3.   Of note, the patient no showed for appointment on 06/18/2015. I first met her on 02/02/2015 at the request of her primary care physician, at which time she reported snoring, witnessed apneas, and excessive daytime  somnolence. I asked her to return for sleep study. She had a baseline sleep study, followed by a CPAP titration study and I went over her test results with her in detail today. Her baseline sleep study from 03/12/2015 showed a sleep efficiency of 63.4% with a sleep latency of 29 minutes and wake after sleep onset of 146.5 minutes with moderate sleep fragmentation noted. She had an elevated arousal index. She had slow-wave sleep at 22.6%, REM sleep at 13.6% with a mildly prolonged REM latency. She had no significant PLMS, EKG or EEG changes. She had mild to moderate  snoring. She had a total AHI of 8.9 per hour, rising to 47.7 per hour during REM sleep at 15.8 per hour in the supine position. Baseline oxygen saturation was 96%, nadir was 81%. Time below 88% saturation was 5 minutes and 28 seconds. Based on her sleep-related concerns and her baseline sleep test results I invited her back for a CPAP titration study. She had this on 04/12/2015. Sleep efficiency was 85%. Sleep latency was 20 minutes and wake after sleep onset was 28 minutes with moderate, then mild sleep fragmentation noted. She had an increased percentage of stage II sleep. She had a decreased percentage of slow-wave sleep and a near normal percentage of REM sleep. She had no significant PLMS. She had a normal REM latency. She had no significant EEG or EKG changes. Snoring was eliminated. CPAP was titrated from 5 cm to 9 cm. AHI was 0 per hour and the final pressure. Average oxygen saturation was 95%, nadir was 80%. On the final pressure, her oxygen nadir was 91%. Based on the test results I prescribed CPAP therapy for home use.   I reviewed her CPAP compliance from 06/05/15 to 07/04/2015, which is a total of 30 days during which time she used her machine every night with percent used days greater than 4 hours at 100%, indicating superb compliance with an average usage of 6 hours and 21 minutes, residual AHI low at 0.2 per hour, leaked low with the 95th percentile at 1.2 L/m on a pressure of 9 cm with EPR of 3.   I reviewed her CPAP compliance data from 05/14/2015 through 06/12/2015 which is a total of 30 days during which time she used her machine every day with percent used days greater than 4 hours at 100%, indicating superb compliance with an average usage of 6 hours and 55 minutes, residual AHI low at 0.3 per hour, leak low for the 95th percentile at 3 L/m, pressure of 9 cm with EPR of 3.   02/02/2015: She reports snoring and witnessed apneas while asleep. She has a family history of obstructive sleep  apnea as well. She complains of excessive daytime somnolence. Her Epworth sleepiness score is 11 out of 24 today and her fatigue score is 51 out of 63.   Her bedtime is usually late at around 2 AM. She's always gone to bed late even when she worked 2 jobs. 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 Her rise time is around 9 or 10 AM. Typically she will get up at 545 and drink something such as juice and then go back to bed and go back to sleep. She does have nocturia on average 2 times per night. Occasionally she will wake up with a headache. She has been noted to snore which can be loud. Her husband sleeps in a separate bedroom. She lives with her husband and one of her  2 daughters. She has been told that she makes pauses in her breathing and then resumes snoring. One of her daughters has sleep apnea on a CPAP machine. One of her nephews has sleep apnea and also is on a CPAP machine. She is a nonsmoker. She drinks alcohol very rarely. She likes to drink about 2 sodas per day and drinks bottled green tea about 2 or 3 bottles per day. She denies any restless leg symptoms or twitching in her sleep. She has had leg cramps at night. She may not drink enough water. She is trying to lose weight. She has been able to lose a few pounds.   Her Past Medical History Is Significant For: Past Medical History:  Diagnosis Date  . Allergy   . Arthritis   . Hypertension     Her Past Surgical History Is Significant For: Past Surgical History:  Procedure Laterality Date  . ABDOMINAL HYSTERECTOMY    . breast biopsy Left    benign  . CHOLECYSTECTOMY      Her Family History Is Significant For: Family History  Problem Relation Age of Onset  . Stroke Brother   . Alzheimer's disease Brother   . Asthma Daughter   . Diabetes Sister   . Heart disease Sister   . Mental illness Sister        depression, anxiety, resolved with treatment  . Graves' disease Daughter   . Colon cancer  Neg Hx   . Esophageal cancer Neg Hx   . Rectal cancer Neg Hx   . Stomach cancer Neg Hx     Her Social History Is Significant For: Social History   Social History  . Marital status: Married    Spouse name: Sheppard Coil  . Number of children: 2  . Years of education: HS   Occupational History  . [employer] [address]   Social History Main Topics  . Smoking status: Never Smoker  . Smokeless tobacco: Never Used  . Alcohol use 0.0 oz/week     Comment: very seldom  . Drug use: No  . Sexual activity: No   Other Topics Concern  . None   Social History Narrative   1-2 sodas a day, daily consumption of tea   Lives with her husband and and their youngest daughter.    Her Allergies Are:  No Known Allergies:   Her Current Medications Are:  Outpatient Encounter Prescriptions as of 01/06/2017  Medication Sig  . albuterol (PROVENTIL HFA;VENTOLIN HFA) 108 (90 Base) MCG/ACT inhaler Inhale 2 puffs into the lungs every 4 (four) hours as needed for wheezing or shortness of breath (cough, shortness of breath or wheezing.).  Marland Kitchen atenolol-chlorthalidone (TENORETIC) 50-25 MG tablet Take 1 tablet by mouth daily.  Marland Kitchen lisinopril (PRINIVIL,ZESTRIL) 40 MG tablet One daily  . [DISCONTINUED] Zoster Vaccine Live, PF, (ZOSTAVAX) 50277 UNT/0.65ML injection Inject 19,400 Units into the skin once. (Patient not taking: Reported on 09/08/2016)   No facility-administered encounter medications on file as of 01/06/2017.   :  Review of Systems:  Out of a complete 14 point review of systems, all are reviewed and negative with the exception of these symptoms as listed below: Review of Systems  Neurological:       Patient states that she is doing well with CPAP. No new concerns.     Objective:  Neurologic Exam  Physical Exam Physical Examination:   Vitals:   01/06/17 0823  BP: 112/78  Pulse: 82  Resp: 16  General Examination: The patient is a very pleasant 71 y.o. female in no acute distress.  She appears well-developed and well-nourished and well groomed.   HEENT: Normocephalic, atraumatic, pupils are equal, round and reactive to light and accommodation. Extraocular tracking is good without limitation to gaze excursion or nystagmus noted. Normal smooth pursuit is noted. Hearing is grossly intact. Face is symmetric with normal facial animation and normal facial sensation. Speech is clear with no dysarthria noted. There is no hypophonia. There is no lip, neck/head, jaw or voice tremor. Neck is supple with full range of passive and active motion. There are no carotid bruits on auscultation. Oropharynx exam reveals: mild mouth dryness, adequate dental hygiene and moderate airway crowding. Mallampati is class II. Tongue protrudes centrally and palate elevates symmetrically. Nasal inspection reveals no significant nasal mucosal bogginess or redness and no septal deviation. Tonsils about 1+.   Chest: Clear to auscultation without wheezing, rhonchi or crackles noted.  Heart: S1+S2+0, regular and normal without murmurs, rubs or gallops noted.   Abdomen: Soft, non-tender and non-distended with normal bowel sounds appreciated on auscultation.  Extremities: There is no pitting edema in the distal lower extremities bilaterally.   Skin: Warm and dry without trophic changes noted. There are no varicose veins.   Musculoskeletal: exam reveals no obvious joint deformities, tenderness or joint swelling or erythema, some discomfort L knee.    Neurologically:  Mental status: The patient is awake, alert and oriented in all 4 spheres. Her immediate and remote memory, attention, language skills and fund of knowledge are appropriate. There is no evidence of aphasia, agnosia, apraxia or anomia. Speech is clear with normal prosody and enunciation. Thought process is linear. Mood is normal and affect is normal.  Cranial nerves II - XII are as described above under HEENT exam. In addition: shoulder shrug is  normal with equal shoulder height noted. Motor exam: Normal bulk, strength and tone is noted. There is no drift, resting or postural or action tremor. Romberg is negative. Reflexes are 1+ throughout. Fine motor skills and coordination: intact grossly.   Cerebellar testing: No dysmetria or intention tremor on finger to nose testing. Heel to shin is difficult for her due to body habitus. There is no truncal or gait ataxia.  Sensory exam: intact to light touch in the upper and lower extremities.  Gait, station and balance: She stands easily. No veering to one side is noted. No leaning to one side is noted. Posture is age-appropriate and stance is narrow based. Gait shows normal stride length and normal pace. No problems turning are noted. Tandem walk is difficult today.   Assessment and Plan:   In summary, Wave P Knick is a very pleasant 71 year old female with an underlying medical history of hypertension, allergies, arthritis and obesity, who presents for follow-up consultation of her obstructive sleep apnea, on treatment with CPAP at 9 cm pressure. She continues to be fully compliant with treatments and endorses ongoing good results. She is commended for this. She is reminded to work on weight loss, she has gained a little bit of weight and tends to fluctuate within 5-8 pounds up and down. She reports some symptoms in keeping with right-sided meralgia paresthetica. She is reminded to try to work on weight loss for this. She likes to drink sweet tea and is encouraged to drink more water, less high-calorie drinks. Of note, she had a baseline sleep study on 03/12/2015, followed by a CPAP titration study on 04/12/2015. Feet reviewed her most recent  compliance data today. Physical exam is otherwise stable. She is advised to follow-up in one year, sooner if needed. She can see one of herpractitioner's next time as she is doing well and feels stable. She reports being up-to-date with her supplies for CPAP. I  answered all her questions today and she was in agreement. I spent 25 minutes in total face-to-face time with the patient, more than 50% of which was spent in counseling and coordination of care, reviewing test results, reviewing medication and discussing or reviewing the diagnosis of OSA, its prognosis and treatment options. Pertinent laboratory and imaging test results that were available during this visit with the patient were reviewed by me and considered in my medical decision making (see chart for details).

## 2017-03-12 ENCOUNTER — Other Ambulatory Visit: Payer: Self-pay | Admitting: Physician Assistant

## 2017-03-12 DIAGNOSIS — I10 Essential (primary) hypertension: Secondary | ICD-10-CM

## 2017-03-16 ENCOUNTER — Encounter: Payer: Self-pay | Admitting: Emergency Medicine

## 2017-03-16 ENCOUNTER — Ambulatory Visit (INDEPENDENT_AMBULATORY_CARE_PROVIDER_SITE_OTHER): Payer: Medicare Other | Admitting: Emergency Medicine

## 2017-03-16 ENCOUNTER — Other Ambulatory Visit: Payer: Self-pay | Admitting: Physician Assistant

## 2017-03-16 VITALS — BP 156/93 | HR 74 | Temp 98.0°F | Resp 18 | Ht 61.02 in | Wt 241.0 lb

## 2017-03-16 DIAGNOSIS — I10 Essential (primary) hypertension: Secondary | ICD-10-CM | POA: Diagnosis not present

## 2017-03-16 DIAGNOSIS — Z1231 Encounter for screening mammogram for malignant neoplasm of breast: Secondary | ICD-10-CM

## 2017-03-16 MED ORDER — ATENOLOL-CHLORTHALIDONE 50-25 MG PO TABS: 1.0000 | ORAL_TABLET | Freq: Every day | ORAL | 3 refills | Status: DC

## 2017-03-16 MED ORDER — LISINOPRIL 40 MG PO TABS: ORAL_TABLET | ORAL | 3 refills | Status: DC

## 2017-03-16 NOTE — Patient Instructions (Addendum)
   IF you received an x-ray today, you will receive an invoice from [facility]. Please contact [facility] at [phone] with questions or concerns regarding your invoice.   IF you received labwork today, you will receive an invoice from [facility]. Please contact [facility] at [phone] with questions or concerns regarding your invoice.   Our billing staff will not be able to assist you with questions regarding bills from these companies.  You will be contacted with the lab results as soon as they are available. The fastest way to get your results is to activate your My Chart account. Instructions are located on the last page of this paperwork. If you have not heard from us regarding the results in 2 weeks, please contact this office.     Hypertension Hypertension is another name for high blood pressure. High blood pressure forces your heart to work harder to pump blood. This can cause problems over time. There are two numbers in a blood pressure reading. There is a top number (systolic) over a bottom number (diastolic). It is best to have a blood pressure below 120/80. Healthy choices can help lower your blood pressure. You may need medicine to help lower your blood pressure if:  Your blood pressure cannot be lowered with healthy choices.  Your blood pressure is higher than 130/80.  Follow these instructions at home: Eating and drinking  If directed, follow the DASH eating plan. This diet includes: ? Filling half of your plate at each meal with fruits and vegetables. ? Filling one quarter of your plate at each meal with whole grains. Whole grains include whole wheat pasta, brown rice, and whole grain bread. ? Eating or drinking low-fat dairy products, such as skim milk or low-fat yogurt. ? Filling one quarter of your plate at each meal with low-fat (lean) proteins. Low-fat proteins include fish, skinless chicken, eggs, beans, and tofu. ? Avoiding fatty meat, cured  and processed meat, or chicken with skin. ? Avoiding premade or processed food.  Eat less than 1,500 mg of salt (sodium) a day.  Limit alcohol use to no more than 1 drink a day for nonpregnant women and 2 drinks a day for men. One drink equals 12 oz of beer, 5 oz of wine, or 1 oz of hard liquor. Lifestyle  Work with your doctor to stay at a healthy weight or to lose weight. Ask your doctor what the best weight is for you.  Get at least 30 minutes of exercise that causes your heart to beat faster (aerobic exercise) most days of the week. This may include walking, swimming, or biking.  Get at least 30 minutes of exercise that strengthens your muscles (resistance exercise) at least 3 days a week. This may include lifting weights or pilates.  Do not use any products that contain nicotine or tobacco. This includes cigarettes and e-cigarettes. If you need help quitting, ask your doctor.  Check your blood pressure at home as told by your doctor.  Keep all follow-up visits as told by your doctor. This is important. Medicines  Take over-the-counter and prescription medicines only as told by your doctor. Follow directions carefully.  Do not skip doses of blood pressure medicine. The medicine does not work as well if you skip doses. Skipping doses also puts you at risk for problems.  Ask your doctor about side effects or reactions to medicines that you should watch for. Contact a doctor if:  You think you are having a reaction to the   medicine you are taking.  You have headaches that keep coming back (recurring).  You feel dizzy.  You have swelling in your ankles.  You have trouble with your vision. Get help right away if:  You get a very bad headache.  You start to feel confused.  You feel weak or numb.  You feel faint.  You get very bad pain in your: ? Chest. ? Belly (abdomen).  You throw up (vomit) more than once.  You have trouble breathing. Summary  Hypertension is  another name for high blood pressure.  Making healthy choices can help lower blood pressure. If your blood pressure cannot be controlled with healthy choices, you may need to take medicine. This information is not intended to replace advice given to you by your health care provider. Make sure you discuss any questions you have with your health care provider. Document Released: 01/28/2008 Document Revised: 07/09/2016 Document Reviewed: 07/09/2016 Elsevier Interactive Patient Education  2018 [employer]  

## 2017-03-16 NOTE — Progress Notes (Signed)
Miranda [name] 71 y.o.   Chief Complaint  Patient presents with  . Hypertension    med refills     HISTORY OF PRESENT ILLNESS: This is a 71 y.o. female with h/o HTN ran out of medications. No complaints or medical concerns.  HPI   Prior to Admission medications   Medication Sig Start Date End Date Taking? Authorizing Provider  albuterol (PROVENTIL HFA;VENTOLIN HFA) 108 (90 Base) MCG/ACT inhaler Inhale 2 puffs into the lungs every 4 (four) hours as needed for wheezing or shortness of breath (cough, shortness of breath or wheezing.). 04/12/16  Yes Miranda Haber, MD  atenolol-chlorthalidone (TENORETIC) 50-25 MG tablet Take 1 tablet by mouth daily. 03/16/17  Yes Miranda Pollen, MD  lisinopril (PRINIVIL,ZESTRIL) 40 MG tablet One daily 03/16/17  Yes Miranda [name], Miranda Bloomer, MD    No Known Allergies  Patient Active Problem List   Diagnosis Date Noted  . BMI 45.0-49.9, adult ([place]) 03/14/2016  . Pre-diabetes 02/28/2015  . Allergic rhinitis 12/16/2014  . Snoring 12/16/2014  . HTN (hypertension) 01/17/2012    Past Medical History:  Diagnosis Date  . Allergy   . Arthritis   . Hypertension     Past Surgical History:  Procedure Laterality Date  . ABDOMINAL HYSTERECTOMY    . breast biopsy Left    benign  . CHOLECYSTECTOMY      Social History   Social History  . Marital status: Married    Spouse name: Miranda [name]  . Number of children: 2  . Years of education: HS   Occupational History  . [employer]   Social History Main Topics  . Smoking status: Never Smoker  . Smokeless tobacco: Never Used  . Alcohol use 0.0 oz/week     Comment: very seldom  . Drug use: No  . Sexual activity: No   Other Topics Concern  . Not on file   Social History Narrative   1-2 sodas a day, daily consumption of tea   Lives with her husband and and their youngest daughter.    Family History  Problem Relation Age of Onset  . Stroke Brother   . Alzheimer's disease  Brother   . Asthma Daughter   . Diabetes Sister   . Heart disease Sister   . Mental illness Sister        depression, anxiety, resolved with treatment  . Graves' disease Daughter   . Colon cancer Neg Hx   . Esophageal cancer Neg Hx   . Rectal cancer Neg Hx   . Stomach cancer Neg Hx      Review of Systems  Constitutional: Negative.  Negative for chills, fever and weight loss.  HENT: Negative.  Negative for congestion, nosebleeds and sore throat.   Eyes: Negative.  Negative for blurred vision, double vision, discharge and redness.  Respiratory: Negative.  Negative for cough, shortness of breath and wheezing.   Cardiovascular: Negative.  Negative for chest pain, palpitations and leg swelling.  Gastrointestinal: Negative.  Negative for abdominal pain, diarrhea, nausea and vomiting.  Genitourinary: Negative.  Negative for dysuria and hematuria.  Musculoskeletal: Negative.  Negative for myalgias and neck pain.  Skin: Negative.  Negative for rash.  Neurological: Negative.  Negative for dizziness and headaches.  Endo/Heme/Allergies: Negative.   All other systems reviewed and are negative.   Vitals:   03/16/17 1738  BP: (!) 156/93  Pulse: 74  Resp: 18  Temp: 98 F (36.7 C)    Physical Exam  Constitutional: She is oriented to person, place, and time. She appears well-developed.  Obese.  HENT:  Head: Normocephalic and atraumatic.  Nose: Nose normal.  Mouth/Throat: Oropharynx is clear and moist. No oropharyngeal exudate.  Eyes: Pupils are equal, round, and reactive to light. Conjunctivae and EOM are normal.  Neck: Normal range of motion. Neck supple. No JVD present. No thyromegaly present.  Cardiovascular: Normal rate, regular rhythm, normal heart sounds and intact distal pulses.   Pulmonary/Chest: Effort normal and breath sounds normal.  Abdominal: Soft. There is no tenderness.  Musculoskeletal: Normal range of motion.  Neurological: She is alert and oriented to person, place,  and time. She exhibits normal muscle tone.  Skin: Skin is warm and dry. No rash noted.  Psychiatric: She has a normal mood and affect. Her behavior is normal.  Vitals reviewed.    ASSESSMENT & PLAN: Miranda [name] was seen today for hypertension.  Diagnoses and all orders for this visit:  Essential hypertension -     CBC with Differential/Platelet -     Comprehensive metabolic panel -     Lipid panel -     Hemoglobin A1c -     lisinopril (PRINIVIL,ZESTRIL) 40 MG tablet; One daily -     atenolol-chlorthalidone (TENORETIC) 50-25 MG tablet; Take 1 tablet by mouth daily.    Patient Instructions       IF you received an x-ray today, you will receive an invoice from [facility]. Please contact [facility] at [phone] with questions or concerns regarding your invoice.   IF you received labwork today, you will receive an invoice from [facility]. Please contact [facility] at [phone] with questions or concerns regarding your invoice.   Our billing staff will not be able to assist you with questions regarding bills from these companies.  You will be contacted with the lab results as soon as they are available. The fastest way to get your results is to activate your My Chart account. Instructions are located on the last page of this paperwork. If you have not heard from [place] regarding the results in 2 weeks, please contact this office.      Hypertension Hypertension is another name for high blood pressure. High blood pressure forces your heart to work harder to pump blood. This can cause problems over time. There are two numbers in a blood pressure reading. There is a top number (systolic) over a bottom number (diastolic). It is best to have a blood pressure below 120/80. Healthy choices can help lower your blood pressure. You may need medicine to help lower your blood pressure if:  Your blood pressure cannot be lowered with healthy choices.  Your blood pressure is higher  than 130/80.  Follow these instructions at home: Eating and drinking  If directed, follow the DASH eating plan. This diet includes: ? Filling half of your plate at each meal with fruits and vegetables. ? Filling one quarter of your plate at each meal with whole grains. Whole grains include whole wheat pasta, brown rice, and whole grain bread. ? Eating or drinking low-fat dairy products, such as skim milk or low-fat yogurt. ? Filling one quarter of your plate at each meal with low-fat (lean) proteins. Low-fat proteins include fish, skinless chicken, eggs, beans, and tofu. ? Avoiding fatty meat, cured and processed meat, or chicken with skin. ? Avoiding premade or processed food.  Eat less than 1,500 mg of salt (sodium) a day.  Limit alcohol use to no more than 1 drink a day  for nonpregnant women and 2 drinks a day for men. One drink equals 12 oz of beer, 5 oz of wine, or 1 oz of hard liquor. Lifestyle  Work with your doctor to stay at a healthy weight or to lose weight. Ask your doctor what the best weight is for you.  Get at least 30 minutes of exercise that causes your heart to beat faster (aerobic exercise) most days of the week. This may include walking, swimming, or biking.  Get at least 30 minutes of exercise that strengthens your muscles (resistance exercise) at least 3 days a week. This may include lifting weights or pilates.  Do not use any products that contain nicotine or tobacco. This includes cigarettes and e-cigarettes. If you need help quitting, ask your doctor.  Check your blood pressure at home as told by your doctor.  Keep all follow-up visits as told by your doctor. This is important. Medicines  Take over-the-counter and prescription medicines only as told by your doctor. Follow directions carefully.  Do not skip doses of blood pressure medicine. The medicine does not work as well if you skip doses. Skipping doses also puts you at risk for problems.  Ask your  doctor about side effects or reactions to medicines that you should watch for. Contact a doctor if:  You think you are having a reaction to the medicine you are taking.  You have headaches that keep coming back (recurring).  You feel dizzy.  You have swelling in your ankles.  You have trouble with your vision. Get help right away if:  You get a very bad headache.  You start to feel confused.  You feel weak or numb.  You feel faint.  You get very bad pain in your: ? Chest. ? Belly (abdomen).  You throw up (vomit) more than once.  You have trouble breathing. Summary  Hypertension is another name for high blood pressure.  Making healthy choices can help lower blood pressure. If your blood pressure cannot be controlled with healthy choices, you may need to take medicine. This information is not intended to replace advice given to you by your health care provider. Make sure you discuss any questions you have with your health care provider. Document Released: 01/28/2008 Document Revised: 07/09/2016 Document Reviewed: 07/09/2016 Elsevier Interactive Patient Education  2018 Elsevier Inc.      Agustina Caroli, MD [facility]

## 2017-03-17 LAB — LIPID PANEL
CHOLESTEROL TOTAL: 188 mg/dL (ref 100–199)
Chol/HDL Ratio: 2.4 ratio (ref 0.0–4.4)
HDL: 77 mg/dL (ref >39)
LDL CALC: 98 mg/dL (ref 0–99)
TRIGLYCERIDES: 64 mg/dL (ref 0–149)
VLDL CHOLESTEROL CAL: 13 mg/dL (ref 5–40)

## 2017-03-17 LAB — HEMOGLOBIN A1C
ESTIMATED AVERAGE GLUCOSE: 120 mg/dL
HEMOGLOBIN A1C: 5.8 % — AB (ref 4.8–5.6)

## 2017-03-17 LAB — COMPREHENSIVE METABOLIC PANEL
ALBUMIN: 4 g/dL (ref 3.6–4.8)
ALK PHOS: 110 IU/L (ref 39–117)
ALT: 13 IU/L (ref 0–32)
AST: 20 IU/L (ref 0–40)
Albumin/Globulin Ratio: 1.2 (ref 1.2–2.2)
BUN/Creatinine Ratio: 20 (ref 12–28)
BUN: 21 mg/dL (ref 8–27)
Bilirubin Total: 0.3 mg/dL (ref 0.0–1.2)
CHLORIDE: 99 mmol/L (ref 96–106)
CO2: 27 mmol/L (ref 20–29)
CREATININE: 1.06 mg/dL — AB (ref 0.57–1.00)
Calcium: 9.9 mg/dL (ref 8.7–10.3)
GFR calc Af Amer: 62 mL/min/{1.73_m2} (ref >59)
GFR calc non Af Amer: 54 mL/min/{1.73_m2} — ABNORMAL LOW (ref >59)
GLUCOSE: 98 mg/dL (ref 65–99)
Globulin, Total: 3.3 g/dL (ref 1.5–4.5)
Potassium: 4.2 mmol/L (ref 3.5–5.2)
Sodium: 141 mmol/L (ref 134–144)
Total Protein: 7.3 g/dL (ref 6.0–8.5)

## 2017-03-17 LAB — CBC WITH DIFFERENTIAL/PLATELET
BASOS ABS: 0 10*3/uL (ref 0.0–0.2)
Basos: 0 %
EOS (ABSOLUTE): 0.2 10*3/uL (ref 0.0–0.4)
Eos: 3 %
HEMOGLOBIN: 11.7 g/dL (ref 11.1–15.9)
Hematocrit: 34.7 % (ref 34.0–46.6)
Immature Grans (Abs): 0 10*3/uL (ref 0.0–0.1)
Immature Granulocytes: 0 %
LYMPHS ABS: 1.9 10*3/uL (ref 0.7–3.1)
LYMPHS: 28 %
MCH: 24.7 pg — ABNORMAL LOW (ref 26.6–33.0)
MCHC: 33.7 g/dL (ref 31.5–35.7)
MCV: 73 fL — ABNORMAL LOW (ref 79–97)
MONOCYTES: 5 %
Monocytes Absolute: 0.3 10*3/uL (ref 0.1–0.9)
Neutrophils Absolute: 4.5 10*3/uL (ref 1.4–7.0)
Neutrophils: 64 %
PLATELETS: 248 10*3/uL (ref 150–379)
RBC: 4.73 x10E6/uL (ref 3.77–5.28)
RDW: 16.5 % — AB (ref 12.3–15.4)
WBC: 7 10*3/uL (ref 3.4–10.8)

## 2017-03-31 ENCOUNTER — Ambulatory Visit
Admission: RE | Admit: 2017-03-31 | Discharge: 2017-03-31 | Disposition: A | Discharge status: Home / Self Care | Payer: Medicare Other | Source: Ambulatory Visit | Attending: Physician Assistant | Admitting: Physician Assistant

## 2017-03-31 DIAGNOSIS — Z1231 Encounter for screening mammogram for malignant neoplasm of breast: Secondary | ICD-10-CM

## 2017-04-01 ENCOUNTER — Other Ambulatory Visit: Payer: Self-pay | Admitting: Physician Assistant

## 2017-04-01 DIAGNOSIS — R928 Other abnormal and inconclusive findings on diagnostic imaging of breast: Secondary | ICD-10-CM

## 2017-04-02 ENCOUNTER — Other Ambulatory Visit: Payer: Self-pay

## 2017-04-02 DIAGNOSIS — R928 Other abnormal and inconclusive findings on diagnostic imaging of breast: Secondary | ICD-10-CM

## 2017-04-03 ENCOUNTER — Ambulatory Visit
Admission: RE | Admit: 2017-04-03 | Discharge: 2017-04-03 | Disposition: A | Discharge status: Home / Self Care | Payer: Medicare Other | Source: Ambulatory Visit | Attending: Physician Assistant | Admitting: Physician Assistant

## 2017-06-03 ENCOUNTER — Encounter: Payer: Self-pay | Admitting: Emergency Medicine

## 2017-06-03 ENCOUNTER — Ambulatory Visit (INDEPENDENT_AMBULATORY_CARE_PROVIDER_SITE_OTHER): Payer: Medicare Other | Admitting: Emergency Medicine

## 2017-06-03 VITALS — BP 136/78 | HR 89 | Temp 99.2°F | Resp 16 | Ht 60.0 in | Wt 243.0 lb

## 2017-06-03 DIAGNOSIS — J0100 Acute maxillary sinusitis, unspecified: Secondary | ICD-10-CM | POA: Diagnosis not present

## 2017-06-03 DIAGNOSIS — R51 Headache: Secondary | ICD-10-CM

## 2017-06-03 DIAGNOSIS — R0981 Nasal congestion: Secondary | ICD-10-CM | POA: Diagnosis not present

## 2017-06-03 DIAGNOSIS — I10 Essential (primary) hypertension: Secondary | ICD-10-CM | POA: Diagnosis not present

## 2017-06-03 DIAGNOSIS — J3489 Other specified disorders of nose and nasal sinuses: Secondary | ICD-10-CM | POA: Diagnosis not present

## 2017-06-03 DIAGNOSIS — R519 Headache, unspecified: Secondary | ICD-10-CM | POA: Insufficient documentation

## 2017-06-03 MED ORDER — TRIAMCINOLONE ACETONIDE 55 MCG/ACT NA AERO: 2.0000 | INHALATION_SPRAY | Freq: Every day | NASAL | 12 refills | Status: DC

## 2017-06-03 MED ORDER — AMOXICILLIN-POT CLAVULANATE 875-125 MG PO TABS
1.0000 | ORAL_TABLET | Freq: Two times a day (BID) | ORAL | 0 refills | Status: AC
Start: 2017-06-03 — End: 2017-06-10

## 2017-06-03 MED ORDER — CETIRIZINE HCL 10 MG PO TABS: 10.0000 mg | ORAL_TABLET | Freq: Every day | ORAL | 11 refills | Status: DC

## 2017-06-03 NOTE — Progress Notes (Signed)
[facility]

## 2017-06-03 NOTE — Patient Instructions (Addendum)
     IF you received an x-ray today, you will receive an invoice from [facility]. Please contact [facility] at [phone] with questions or concerns regarding your invoice.   IF you received labwork today, you will receive an invoice from [facility]. Please contact [facility] at [phone] with questions or concerns regarding your invoice.   Our billing staff will not be able to assist you with questions regarding bills from these companies.  You will be contacted with the lab results as soon as they are available. The fastest way to get your results is to activate your My Chart account. Instructions are located on the last page of this paperwork. If you have not heard from us regarding the results in 2 weeks, please contact this office.     Sinus Headache A sinus headache happens when your sinuses become clogged or swollen. You may feel pain or pressure in your face, forehead, ears, or upper teeth. Sinus headaches can be mild or severe. Follow these instructions at home:  Take medicines only as told by your doctor.  If you were given an antibiotic medicine, finish all of it even if you start to feel better.  Use a nose spray if you feel stuffed up (congested).  If told, apply a warm, moist washcloth to your face to help lessen pain. Contact a doctor if:  You get headaches more than one time each week.  Light or sound bothers you.  You have a fever.  You feel sick to your stomach (nauseous) or you throw up (vomit).  Your headaches do not get better with treatment. Get help right away if:  You have trouble seeing.  You suddenly have very bad pain in your face or head.  You start to twitch or shake (seizure).  You are confused.  You have a stiff neck. This information is not intended to replace advice given to you by your health care provider. Make sure you discuss any questions you have with your health care provider. Document Released: 12/11/2010  Document Revised: 04/06/2016 Document Reviewed: 08/07/2014 Elsevier Interactive Patient Education  2018 Elsevier Inc.  

## 2017-06-03 NOTE — Progress Notes (Signed)
Miranda [name] 71 y.o.   Chief Complaint  Patient presents with  . Sinus Problem     HEADACHE around the eyes x 4 days not getting better  . Nasal Congestion    HISTORY OF PRESENT ILLNESS: This is a 71 y.o. female complaining of possible sinus infection that started 4 days ago.  Sinus Problem  This is a new problem. The current episode started in the past 7 days. The problem has been gradually worsening since onset. There has been no fever. The pain is moderate. Associated symptoms include congestion, coughing, headaches and sinus pressure. Pertinent negatives include no chills, diaphoresis, ear pain, neck pain, shortness of breath, sore throat or swollen glands. Past treatments include nothing.     Prior to Admission medications   Medication Sig Start Date End Date Taking? Authorizing Provider  albuterol (PROVENTIL HFA;VENTOLIN HFA) 108 (90 Base) MCG/ACT inhaler Inhale 2 puffs into the lungs every 4 (four) hours as needed for wheezing or shortness of breath (cough, shortness of breath or wheezing.). 04/12/16  Yes Robyn Haber, MD  atenolol-chlorthalidone (TENORETIC) 50-25 MG tablet Take 1 tablet by mouth daily. 03/16/17  Yes Horald Pollen, MD  lisinopril (PRINIVIL,ZESTRIL) 40 MG tablet One daily 03/16/17  Yes Dorien Bessent, Ines Bloomer, MD    No Known Allergies  Patient Active Problem List   Diagnosis Date Noted  . BMI 45.0-49.9, adult ([place]) 03/14/2016  . Pre-diabetes 02/28/2015  . Allergic rhinitis 12/16/2014  . Snoring 12/16/2014  . HTN (hypertension) 01/17/2012    Past Medical History:  Diagnosis Date  . Allergy   . Arthritis   . Hypertension     Past Surgical History:  Procedure Laterality Date  . ABDOMINAL HYSTERECTOMY    . breast biopsy Left    benign  . CHOLECYSTECTOMY      Social History   Social History  . Marital status: Married    Spouse name: Sheppard Coil  . Number of children: 2  . Years of education: HS   Occupational History  . [employer]   Social History Main Topics  . Smoking status: Never Smoker  . Smokeless tobacco: Never Used  . Alcohol use 0.0 oz/week     Comment: very seldom  . Drug use: No  . Sexual activity: No   Other Topics Concern  . Not on file   Social History Narrative   1-2 sodas a day, daily consumption of tea   Lives with her husband and and their youngest daughter.    Family History  Problem Relation Age of Onset  . Stroke Brother   . Alzheimer's disease Brother   . Asthma Daughter   . Diabetes Sister   . Heart disease Sister   . Mental illness Sister        depression, anxiety, resolved with treatment  . Graves' disease Daughter   . Colon cancer Neg Hx   . Esophageal cancer Neg Hx   . Rectal cancer Neg Hx   . Stomach cancer Neg Hx      Review of Systems  Constitutional: Negative for chills, diaphoresis and fever.  HENT: Positive for congestion, sinus pain and sinus pressure. Negative for ear pain, nosebleeds and sore throat.        Runny nose  Eyes: Negative.  Negative for discharge and redness.  Respiratory: Positive for cough. Negative for hemoptysis, shortness of breath and wheezing.   Cardiovascular: Negative.  Negative for chest pain, palpitations and leg swelling.  Gastrointestinal: Positive for nausea and vomiting. Negative for abdominal pain and diarrhea.  Genitourinary: Negative for dysuria, flank pain and hematuria.  Musculoskeletal: Negative for myalgias and neck pain.  Skin: Negative for rash.  Neurological: Positive for headaches. Negative for dizziness, sensory change and focal weakness.  Endo/Heme/Allergies: Negative.   All other systems reviewed and are negative.   Vitals:   06/03/17 1003  BP: 136/78  Pulse: 89  Resp: 16  Temp: 99.2 F (37.3 C)  SpO2: 97%    Physical Exam  Constitutional: She is oriented to person, place, and time. She appears well-developed and well-nourished.  HENT:  Head: Normocephalic and atraumatic.  Right Ear:  External ear normal.  Left Ear: External ear normal.  Nose: Mucosal edema and rhinorrhea present. Right sinus exhibits no maxillary sinus tenderness. Left sinus exhibits maxillary sinus tenderness.  Mouth/Throat: Oropharynx is clear and moist. No oropharyngeal exudate.  Eyes: Pupils are equal, round, and reactive to light. Conjunctivae and EOM are normal.  Neck: Normal range of motion. Neck supple. No JVD present.  Cardiovascular: Normal rate, regular rhythm, normal heart sounds and intact distal pulses.   Pulmonary/Chest: Effort normal and breath sounds normal.  Abdominal: Soft. Bowel sounds are normal. She exhibits no distension. There is no tenderness.  Musculoskeletal: Normal range of motion.  Neurological: She is alert and oriented to person, place, and time. No sensory deficit. She exhibits normal muscle tone.  Skin: Skin is warm and dry. Capillary refill takes less than 2 seconds. No rash noted.  Psychiatric: She has a normal mood and affect. Her behavior is normal.  Vitals reviewed.    ASSESSMENT & PLAN: Miranda [name] was seen today for sinus problem and nasal congestion.  Diagnoses and all orders for this visit:  Acute non-recurrent maxillary sinusitis -     amoxicillin-clavulanate (AUGMENTIN) 875-125 MG tablet; Take 1 tablet by mouth 2 (two) times daily. -     cetirizine (ZYRTEC) 10 MG tablet; Take 1 tablet (10 mg total) by mouth daily.  Sinus congestion -     triamcinolone (NASACORT) 55 MCG/ACT AERO nasal inhaler; Place 2 sprays into the nose daily.  Sinus pressure -     cetirizine (ZYRTEC) 10 MG tablet; Take 1 tablet (10 mg total) by mouth daily. -     triamcinolone (NASACORT) 55 MCG/ACT AERO nasal inhaler; Place 2 sprays into the nose daily.  Sinus headache  Essential hypertension    Patient Instructions       IF you received an x-ray today, you will receive an invoice from [facility]. Please contact [facility] at [phone] with questions or  concerns regarding your invoice.   IF you received labwork today, you will receive an invoice from [facility]. Please contact [facility] at [phone] with questions or concerns regarding your invoice.   Our billing staff will not be able to assist you with questions regarding bills from these companies.  You will be contacted with the lab results as soon as they are available. The fastest way to get your results is to activate your My Chart account. Instructions are located on the last page of this paperwork. If you have not heard from [place] regarding the results in 2 weeks, please contact this office.     Sinus Headache A sinus headache happens when your sinuses become clogged or swollen. You may feel pain or pressure in your face, forehead, ears, or upper teeth. Sinus headaches can be mild or severe. Follow these instructions at home:  Take medicines only as  told by your doctor.  If you were given an antibiotic medicine, finish all of it even if you start to feel better.  Use a nose spray if you feel stuffed up (congested).  If told, apply a warm, moist washcloth to your face to help lessen pain. Contact a doctor if:  You get headaches more than one time each week.  Light or sound bothers you.  You have a fever.  You feel sick to your stomach (nauseous) or you throw up (vomit).  Your headaches do not get better with treatment. Get help right away if:  You have trouble seeing.  You suddenly have very bad pain in your face or head.  You start to twitch or shake (seizure).  You are confused.  You have a stiff neck. This information is not intended to replace advice given to you by your health care provider. Make sure you discuss any questions you have with your health care provider. Document Released: 12/11/2010 Document Revised: 04/06/2016 Document Reviewed: 08/07/2014 Elsevier Interactive Patient Education  2018 [employer].      Agustina Caroli, MD [facility]

## 2017-09-28 ENCOUNTER — Ambulatory Visit (INDEPENDENT_AMBULATORY_CARE_PROVIDER_SITE_OTHER): Payer: Medicare HMO | Admitting: Emergency Medicine

## 2017-09-28 ENCOUNTER — Ambulatory Visit (INDEPENDENT_AMBULATORY_CARE_PROVIDER_SITE_OTHER): Payer: Medicare HMO

## 2017-09-28 ENCOUNTER — Other Ambulatory Visit: Payer: Self-pay | Admitting: Emergency Medicine

## 2017-09-28 ENCOUNTER — Other Ambulatory Visit: Payer: Self-pay

## 2017-09-28 ENCOUNTER — Encounter: Payer: Self-pay | Admitting: Emergency Medicine

## 2017-09-28 VITALS — BP 116/76 | Temp 97.9°F | Resp 16 | Ht 59.76 in | Wt 242.8 lb

## 2017-09-28 DIAGNOSIS — M25511 Pain in right shoulder: Secondary | ICD-10-CM

## 2017-09-28 DIAGNOSIS — Z23 Encounter for immunization: Secondary | ICD-10-CM | POA: Diagnosis not present

## 2017-09-28 MED ORDER — DICLOFENAC SODIUM 75 MG PO TBEC: 75.0000 mg | DELAYED_RELEASE_TABLET | Freq: Two times a day (BID) | ORAL | 0 refills | Status: AC

## 2017-09-28 NOTE — Progress Notes (Signed)
Miranda [name] 72 y.o.   Chief Complaint  Patient presents with  . Shoulder Pain    R shoulder pain, onset "a couple of months". Getting worse. Rates as 5 on 0-10 scale.     HISTORY OF PRESENT ILLNESS: This is a 72 y.o. female complaining of pain to the right shoulder that started about a month ago at work when she was using a slicer moving the arm up and down.  Has been having pain since.  Advil helps.  Tylenol does not work.  Denies any neurological symptoms to the right arm.  Has full range of motion but it hurts.  Shoulder Pain   The pain is present in the right shoulder. This is a new problem. The current episode started more than 1 month ago. The problem has been waxing and waning. The pain is at a severity of 5/10. The pain is moderate. Pertinent negatives include no fever, joint locking, limited range of motion or tingling. The symptoms are aggravated by activity. She has tried NSAIDS and acetaminophen for the symptoms. The treatment provided mild relief. Family history does not include gout or rheumatoid arthritis. There is no history of gout or osteoarthritis.     Prior to Admission medications   Medication Sig Start Date End Date Taking? Authorizing Provider  albuterol (PROVENTIL HFA;VENTOLIN HFA) 108 (90 Base) MCG/ACT inhaler Inhale 2 puffs into the lungs every 4 (four) hours as needed for wheezing or shortness of breath (cough, shortness of breath or wheezing.). 04/12/16  Yes Robyn Haber, MD  atenolol-chlorthalidone (TENORETIC) 50-25 MG tablet Take 1 tablet by mouth daily. 03/16/17  Yes Horald Pollen, MD  lisinopril (PRINIVIL,ZESTRIL) 40 MG tablet One daily 03/16/17  Yes Cherlyn Syring, Ines Bloomer, MD  cetirizine (ZYRTEC) 10 MG tablet Take 1 tablet (10 mg total) by mouth daily. 06/03/17 06/10/17  Horald Pollen, MD    No Known Allergies  Patient Active Problem List   Diagnosis Date Noted  . Sinus congestion 06/03/2017  . Sinus pressure 06/03/2017  . Acute  non-recurrent maxillary sinusitis 06/03/2017  . Sinus headache 06/03/2017  . BMI 45.0-49.9, adult ([place]) 03/14/2016  . Pre-diabetes 02/28/2015  . Allergic rhinitis 12/16/2014  . Snoring 12/16/2014  . HTN (hypertension) 01/17/2012    Past Medical History:  Diagnosis Date  . Allergy   . Arthritis   . Hypertension     Past Surgical History:  Procedure Laterality Date  . ABDOMINAL HYSTERECTOMY    . breast biopsy Left    benign  . CHOLECYSTECTOMY      Social History   Socioeconomic History  . Marital status: Married    Spouse name: Sheppard Coil  . Number of children: 2  . Years of education: HS  . Highest education level: Not on file  Social Needs  . Financial resource strain: Not on file  . Food insecurity - worry: Not on file  . Food insecurity - inability: Not on file  . Transportation needs - medical: Not on file  . Transportation needs - non-medical: Not on file  Occupational History  . Occupation: Deli Counter    Comment: [employer]  Tobacco Use  . Smoking status: Never Smoker  . Smokeless tobacco: Never Used  Substance and Sexual Activity  . Alcohol use: Yes    Alcohol/week: 0.0 oz    Comment: very seldom  . Drug use: No  . Sexual activity: No  Other Topics Concern  . Not on file  Social History Narrative   1-2 sodas  a day, daily consumption of tea   Lives with her husband and and their youngest daughter.    Family History  Problem Relation Age of Onset  . Stroke Brother   . Alzheimer's disease Brother   . Asthma Daughter   . Diabetes Sister   . Heart disease Sister   . Mental illness Sister        depression, anxiety, resolved with treatment  . Graves' disease Daughter   . Colon cancer Neg Hx   . Esophageal cancer Neg Hx   . Rectal cancer Neg Hx   . Stomach cancer Neg Hx      Review of Systems  Constitutional: Negative for chills and fever.  HENT: Negative.   Eyes: Negative.   Respiratory: Negative for cough and shortness of breath.     Cardiovascular: Negative.  Negative for chest pain and palpitations.  Gastrointestinal: Negative for abdominal pain, diarrhea, nausea and vomiting.  Genitourinary: Negative.  Negative for dysuria and hematuria.  Musculoskeletal: Positive for joint pain (right shoulder). Negative for gout.  Skin: Negative.  Negative for rash.  Neurological: Negative for dizziness, tingling and headaches.  Endo/Heme/Allergies: Negative.   All other systems reviewed and are negative.  Vitals:   09/28/17 1220  BP: 116/76  Resp: 16  Temp: 97.9 F (36.6 C)  SpO2: 97%     Physical Exam  Constitutional: She is oriented to person, place, and time. She appears well-developed and well-nourished.  HENT:  Head: Normocephalic and atraumatic.  Eyes: Conjunctivae and EOM are normal. Pupils are equal, round, and reactive to light.  Neck: Normal range of motion. Neck supple. No thyromegaly present.  Cardiovascular: Normal rate, regular rhythm and normal heart sounds.  Pulmonary/Chest: Effort normal and breath sounds normal.  Abdominal: Soft. She exhibits no distension. There is no tenderness.  Musculoskeletal:  Right shoulder: Full range of motion but complains of pain; no localized tenderness, no erythema, no bruising, no swelling. Right elbow: Within normal limits Right wrist: Within normal limits Right hand: Within normal limits  Lymphadenopathy:    She has no cervical adenopathy.  Neurological: She is alert and oriented to person, place, and time. No sensory deficit. She exhibits normal muscle tone.  Skin: Skin is warm and dry. Capillary refill takes less than 2 seconds. No rash noted.  Psychiatric: She has a normal mood and affect. Her behavior is normal.  Vitals reviewed.  Dg Shoulder Right  Result Date: 09/28/2017 CLINICAL DATA:  Acute right shoulder pain EXAM: RIGHT SHOULDER - 2+ VIEW COMPARISON:  10/16/2006 FINDINGS: Degenerative changes in the [facility] joint with joint space narrowing and spurring.  Glenohumeral joint is maintained. No acute bony abnormality. Specifically, no fracture, subluxation, or dislocation. Soft tissues are intact. IMPRESSION: Degenerative changes in the right AC joint. No acute bony abnormality. Electronically Signed   By: Rolm Baptise [name]   On: 09/28/2017 12:44     ASSESSMENT & PLAN: Callee was seen today for shoulder pain.  Diagnoses and all orders for this visit:  Acute pain of right shoulder -     DG Shoulder Right; Future -     diclofenac (VOLTAREN) 75 MG EC tablet; Take 1 tablet (75 mg total) by mouth 2 (two) times daily for 5 days. -     Ambulatory referral to Orthopedic Surgery  Need for Tdap vaccination -     Cancel: Td vaccine greater than or equal to 7yo preservative free IM     Patient Instructions  IF you received an x-ray today, you will receive an invoice from [facility]. Please contact [facility] at [phone] with questions or concerns regarding your invoice.   IF you received labwork today, you will receive an invoice from [facility]. Please contact [facility] at [phone] with questions or concerns regarding your invoice.   Our billing staff will not be able to assist you with questions regarding bills from these companies.  You will be contacted with the lab results as soon as they are available. The fastest way to get your results is to activate your My Chart account. Instructions are located on the last page of this paperwork. If you have not heard from [place] regarding the results in 2 weeks, please contact this office.      Shoulder Pain Many things can cause shoulder pain, including:  An injury.  Moving the arm in the same way again and again (overuse).  Joint pain (arthritis).  Follow these instructions at home: Take these actions to help with your pain:  Squeeze a soft ball or a foam pad as much as you can. This helps to prevent swelling. It also makes the arm stronger.  Take  over-the-counter and prescription medicines only as told by your doctor.  If told, put ice on the area: ? Put ice in a plastic bag. ? Place a towel between your skin and the bag. ? Leave the ice on for 20 minutes, 2-3 times per day. Stop putting on ice if it does not help with the pain.  If you were given a shoulder sling or immobilizer: ? Wear it as told. ? Remove it to shower or bathe. ? Move your arm as little as possible. ? Keep your hand moving. This helps prevent swelling.  Contact a doctor if:  Your pain gets worse.  Medicine does not help your pain.  You have new pain in your arm, hand, or fingers. Get help right away if:  Your arm, hand, or fingers: ? Tingle. ? Are numb. ? Are swollen. ? Are painful. ? Turn white or blue. This information is not intended to replace advice given to you by your health care provider. Make sure you discuss any questions you have with your health care provider. Document Released: 01/28/2008 Document Revised: 04/06/2016 Document Reviewed: 12/04/2014 Elsevier Interactive Patient Education  2018 [employer].     Agustina Caroli, MD [facility]

## 2017-09-28 NOTE — Patient Instructions (Addendum)
     IF you received an x-ray today, you will receive an invoice from [facility]. Please contact [facility] at [phone] with questions or concerns regarding your invoice.   IF you received labwork today, you will receive an invoice from [facility]. Please contact [facility] at [phone] with questions or concerns regarding your invoice.   Our billing staff will not be able to assist you with questions regarding bills from these companies.  You will be contacted with the lab results as soon as they are available. The fastest way to get your results is to activate your My Chart account. Instructions are located on the last page of this paperwork. If you have not heard from us regarding the results in 2 weeks, please contact this office.     Shoulder Pain Many things can cause shoulder pain, including:  An injury.  Moving the arm in the same way again and again (overuse).  Joint pain (arthritis).  Follow these instructions at home: Take these actions to help with your pain:  Squeeze a soft ball or a foam pad as much as you can. This helps to prevent swelling. It also makes the arm stronger.  Take over-the-counter and prescription medicines only as told by your doctor.  If told, put ice on the area: ? Put ice in a plastic bag. ? Place a towel between your skin and the bag. ? Leave the ice on for 20 minutes, 2-3 times per day. Stop putting on ice if it does not help with the pain.  If you were given a shoulder sling or immobilizer: ? Wear it as told. ? Remove it to shower or bathe. ? Move your arm as little as possible. ? Keep your hand moving. This helps prevent swelling.  Contact a doctor if:  Your pain gets worse.  Medicine does not help your pain.  You have new pain in your arm, hand, or fingers. Get help right away if:  Your arm, hand, or fingers: ? Tingle. ? Are numb. ? Are swollen. ? Are painful. ? Turn white or blue. This information is  not intended to replace advice given to you by your health care provider. Make sure you discuss any questions you have with your health care provider. Document Released: 01/28/2008 Document Revised: 04/06/2016 Document Reviewed: 12/04/2014 Elsevier Interactive Patient Education  2018 Elsevier Inc.  

## 2017-10-12 ENCOUNTER — Encounter (INDEPENDENT_AMBULATORY_CARE_PROVIDER_SITE_OTHER): Payer: Self-pay | Admitting: Orthopaedic Surgery

## 2017-10-12 ENCOUNTER — Ambulatory Visit (INDEPENDENT_AMBULATORY_CARE_PROVIDER_SITE_OTHER): Payer: Medicare HMO | Admitting: Orthopaedic Surgery

## 2017-10-12 VITALS — BP 169/84 | HR 56 | Resp 14 | Ht 62.0 in | Wt 242.0 lb

## 2017-10-12 DIAGNOSIS — M25511 Pain in right shoulder: Secondary | ICD-10-CM | POA: Diagnosis not present

## 2017-10-12 DIAGNOSIS — G8929 Other chronic pain: Secondary | ICD-10-CM | POA: Diagnosis not present

## 2017-10-12 MED ORDER — LIDOCAINE HCL 2 % IJ SOLN
2.0000 mL | INTRAMUSCULAR | Status: AC | PRN
  Administered 2017-10-12: 2 mL

## 2017-10-12 MED ORDER — BUPIVACAINE HCL 0.5 % IJ SOLN
2.0000 mL | INTRAMUSCULAR | Status: AC | PRN
  Administered 2017-10-12: 2 mL via INTRA_ARTICULAR

## 2017-10-12 MED ORDER — METHYLPREDNISOLONE ACETATE 40 MG/ML IJ SUSP
80.0000 mg | INTRAMUSCULAR | Status: AC | PRN
  Administered 2017-10-12: 80 mg

## 2017-10-12 NOTE — Progress Notes (Signed)
Office Visit Note   Patient: Miranda [name]           Date of Birth: 07-13-1946           MRN: [id number] Visit Date: 10/12/2017              Requested by: Horald Pollen, MD [place], [place] [address] PCP: Horald Pollen, MD   Assessment & Plan: Visit Diagnoses:  1. Chronic right shoulder pain     Plan: At least a 2 month history of right shoulder pain. Possibly traumatic in origin when she felt "something happen" while at work. Having some pain with overhead motion. Certainly could have a rotator cuff tear. We'll try a subacromial cortisone injection and reevaluate in 3 weeks. Consider MRI scan at that point  Follow-Up Instructions: Return in about 3 weeks (around 11/02/2017).   Orders:  Orders Placed This Encounter  Procedures  . Large Joint Inj: R subacromial bursa   No orders of the defined types were placed in this encounter.     Procedures: Large Joint Inj: R subacromial bursa on 10/12/2017 9:11 AM Indications: pain and diagnostic evaluation Details: 25 G 1.5 in needle, anterolateral approach  Arthrogram: No  Medications: 2 mL lidocaine 2 %; 2 mL bupivacaine 0.5 %; 80 mg methylPREDNISolone acetate 40 MG/ML Consent was given by the patient. Immediately prior to procedure a time out was called to verify the correct patient, procedure, equipment, support staff and site/side marked as required. Patient was prepped and draped in the usual sterile fashion.       Clinical Data: No additional findings.   Subjective: Chief Complaint  Patient presents with  . Right Shoulder - Pain    Miranda [name]  is a 72 y.o. female complaining of pain to the right shoulder that started about a month ago at work when she was using a slicer moving the arm up and down.  Has been having pain since.  Mrs.  [name] works part-time at [employer]. She was working the slicing machine several months ago when she felt something "happen" in her right shoulder. It wasn't a "pop" she  did not have any numbness tingling or ecchymosis. She continues to have some difficulty with overhead activity. She is fine sleeping on that shoulder. Persistent pain she visits the office. Has had prior x-rays which I reviewed on the PACS system. There is some lateral impingement and degenerative change at the acromioclavicular joint. The humeral head is centered about the glenoid. No ectopic calcification. No obvious fracture. Some sclerosis about the greater tuberosity  HPI  Review of Systems  Constitutional: Negative for chills, fatigue and fever.  HENT: Negative for hearing loss and tinnitus.   Eyes: Negative for itching.  Respiratory: Negative for chest tightness and shortness of breath.   Cardiovascular: Negative for chest pain, palpitations and leg swelling.  Gastrointestinal: Negative for blood in stool, constipation and diarrhea.  Endocrine: Negative for polyuria.  Genitourinary: Negative for dysuria.  Musculoskeletal: Negative for back pain, joint swelling, neck pain and neck stiffness.  Allergic/Immunologic: Negative for immunocompromised state.  Neurological: Negative for dizziness, numbness and headaches.  Hematological: Does not bruise/bleed easily.  Psychiatric/Behavioral: Negative for sleep disturbance. The patient is not nervous/anxious.      Objective: Vital Signs: BP (!) 169/84   Pulse (!) 56   Resp 14   Ht 5\' 2"  (1.575 m)   Wt 242 lb (109.8 kg)   BMI 44.26 kg/m   Physical Exam  Ortho Exam awake alert and oriented 3. Comfortable sitting. Able to raise right arm overhead with her circuitous arc. Multiple areas of tenderness along the anterior lateral subacromial region. No popping or clicking. Positive impingement and positive empty can testing. Very large arms difficult to tell biceps is in an abnormal position. Good grip and good release. Seems to have good strength.  Specialty Comments:  No specialty comments available.  Imaging: No results  found.   PMFS History: Patient Active Problem List   Diagnosis Date Noted  . Sinus congestion 06/03/2017  . Sinus pressure 06/03/2017  . Acute non-recurrent maxillary sinusitis 06/03/2017  . Sinus headache 06/03/2017  . BMI 45.0-49.9, adult ([place]) 03/14/2016  . Pre-diabetes 02/28/2015  . Allergic rhinitis 12/16/2014  . Snoring 12/16/2014  . HTN (hypertension) 01/17/2012   Past Medical History:  Diagnosis Date  . Allergy   . Arthritis   . Hypertension     Family History  Problem Relation Age of Onset  . Stroke Brother   . Alzheimer's disease Brother   . Asthma Daughter   . Diabetes Sister   . Heart disease Sister   . Mental illness Sister        depression, anxiety, resolved with treatment  . Graves' disease Daughter   . Colon cancer Neg Hx   . Esophageal cancer Neg Hx   . Rectal cancer Neg Hx   . Stomach cancer Neg Hx     Past Surgical History:  Procedure Laterality Date  . ABDOMINAL HYSTERECTOMY    . breast biopsy Left    benign  . CHOLECYSTECTOMY     Social History   Occupational History  . Occupation: Deli Counter    Comment: [employer]  Tobacco Use  . Smoking status: Never Smoker  . Smokeless tobacco: Never Used  Substance and Sexual Activity  . Alcohol use: Yes    Alcohol/week: 0.0 oz    Comment: very seldom  . Drug use: No  . Sexual activity: No

## 2017-11-02 ENCOUNTER — Ambulatory Visit (INDEPENDENT_AMBULATORY_CARE_PROVIDER_SITE_OTHER): Payer: Medicare HMO | Admitting: Orthopaedic Surgery

## 2017-11-21 DIAGNOSIS — G4733 Obstructive sleep apnea (adult) (pediatric): Secondary | ICD-10-CM | POA: Diagnosis not present

## 2017-12-07 ENCOUNTER — Encounter: Payer: Self-pay | Admitting: Physician Assistant

## 2017-12-07 ENCOUNTER — Ambulatory Visit (INDEPENDENT_AMBULATORY_CARE_PROVIDER_SITE_OTHER): Payer: Medicare HMO | Admitting: Physician Assistant

## 2017-12-07 ENCOUNTER — Ambulatory Visit (INDEPENDENT_AMBULATORY_CARE_PROVIDER_SITE_OTHER): Payer: Medicare HMO

## 2017-12-07 ENCOUNTER — Other Ambulatory Visit: Payer: Self-pay

## 2017-12-07 ENCOUNTER — Other Ambulatory Visit: Payer: Self-pay | Admitting: Physician Assistant

## 2017-12-07 VITALS — BP 130/82 | HR 67 | Temp 98.6°F | Resp 16 | Ht 61.0 in | Wt 249.2 lb

## 2017-12-07 DIAGNOSIS — M25571 Pain in right ankle and joints of right foot: Secondary | ICD-10-CM | POA: Diagnosis not present

## 2017-12-07 DIAGNOSIS — Z1329 Encounter for screening for other suspected endocrine disorder: Secondary | ICD-10-CM | POA: Diagnosis not present

## 2017-12-07 DIAGNOSIS — M79674 Pain in right toe(s): Secondary | ICD-10-CM | POA: Diagnosis not present

## 2017-12-07 DIAGNOSIS — M7989 Other specified soft tissue disorders: Secondary | ICD-10-CM | POA: Diagnosis not present

## 2017-12-07 DIAGNOSIS — M109 Gout, unspecified: Secondary | ICD-10-CM | POA: Diagnosis not present

## 2017-12-07 LAB — GLUCOSE, POCT (MANUAL RESULT ENTRY): POC Glucose: 119 mg/dL — AB (ref 70–99)

## 2017-12-07 MED ORDER — PREDNISONE 10 MG PO TABS: ORAL_TABLET | ORAL | 0 refills | Status: DC

## 2017-12-07 MED ORDER — NAPROXEN 500 MG PO TABS: 500.0000 mg | ORAL_TABLET | Freq: Two times a day (BID) | ORAL | 0 refills | Status: AC

## 2017-12-07 NOTE — Patient Instructions (Addendum)
Start taking Prednisone: Take 71m/day x 3 days; then 340mday x3 days; then 2028may x 3 days; then 27m29my x 3 days.  Naproxen is an NSAID. Do not use with any other otc pain medication other than tylenol/acetaminophen - so no aleve, ibuprofen, motrin, advil, etc. Take 500mg72mce daily. Stop taking this 2-3 days *after* the pain resolves in your foot.  Come back in 5-7 days if you are not improving.   Gout Gout is painful swelling that can occur in some of your joints. Gout is a type of arthritis. This condition is caused by having too much uric acid in your body. Uric acid is a chemical that forms when your body breaks down substances called purines. Purines are important for building body proteins. When your body has too much uric acid, sharp crystals can form and build up inside your joints. This causes pain and swelling. Gout attacks can happen quickly and be very painful (acute gout). Over time, the attacks can affect more joints and become more frequent (chronic gout). Gout can also cause uric acid to build up under your skin and inside your kidneys. What are the causes? This condition is caused by too much uric acid in your blood. This can occur because:  Your kidneys do not remove enough uric acid from your blood. This is the most common cause.  Your body makes too much uric acid. This can occur with some cancers and cancer treatments. It can also occur if your body is breaking down too many red blood cells (hemolytic anemia).  You eat too many foods that are high in purines. These foods include organ meats and some seafood. Alcohol, especially beer, is also high in purines.  A gout attack may be triggered by trauma or stress. What increases the risk? This condition is more likely to develop in people who:  Have a family history of gout.  Are female and middle-aged.  Are female and have gone through menopause.  Are obese.  Frequently drink alcohol, especially beer.  Are  dehydrated.  Lose weight too quickly.  Have an organ transplant.  Have lead poisoning.  Take certain medicines, including aspirin, cyclosporine, diuretics, levodopa, and niacin.  Have kidney disease or psoriasis.  What are the signs or symptoms? An attack of acute gout happens quickly. It usually occurs in just one joint. The most common place is the big toe. Attacks often start at night. Other joints that may be affected include joints of the feet, ankle, knee, fingers, wrist, or elbow. Symptoms may include:  Severe pain.  Warmth.  Swelling.  Stiffness.  Tenderness. The affected joint may be very painful to touch.  Shiny, red, or purple skin.  Chills and fever.  Chronic gout may cause symptoms more frequently. More joints may be involved. You may also have white or yellow lumps (tophi) on your hands or feet or in other areas near your joints. How is this diagnosed? This condition is diagnosed based on your symptoms, medical history, and physical exam. You may have tests, such as:  Blood tests to measure uric acid levels.  Removal of joint fluid with a needle (aspiration) to look for uric acid crystals.  X-rays to look for joint damage.  How is this treated? Treatment for this condition has two phases: treating an acute attack and preventing future attacks. Acute gout treatment may include medicines to reduce pain and swelling, including:  NSAIDs.  Steroids. These are strong anti-inflammatory medicines that can be taken by mouth (  orally) or injected into a joint.  Colchicine. This medicine relieves pain and swelling when it is taken soon after an attack. It can be given orally or through an IV tube.  Preventive treatment may include:  Daily use of smaller doses of NSAIDs or colchicine.  Use of a medicine that reduces uric acid levels in your blood.  Changes to your diet. You may need to see a specialist about healthy eating (dietitian).  Follow these  instructions at home: During a Gout Attack  If directed, apply ice to the affected area: ? Put ice in a plastic bag. ? Place a towel between your skin and the bag. ? Leave the ice on for 20 minutes, 2-3 times a day.  Rest the joint as much as possible. If the affected joint is in your leg, you may be given crutches to use.  Raise (elevate) the affected joint above the level of your heart as often as possible.  Drink enough fluids to keep your urine clear or pale yellow.  Take over-the-counter and prescription medicines only as told by your health care provider.  Do not drive or operate heavy machinery while taking prescription pain medicine.  Follow instructions from your health care provider about eating or drinking restrictions.  Return to your normal activities as told by your health care provider. Ask your health care provider what activities are safe for you. Avoiding Future Gout Attacks  Follow a low-purine diet as told by your dietitian or health care provider. Avoid foods and drinks that are high in purines, including liver, kidney, anchovies, asparagus, herring, mushrooms, mussels, and beer.  Limit alcohol intake to no more than 1 drink a day for nonpregnant women and 2 drinks a day for men. One drink equals 12 oz of beer, 5 oz of wine, or 1 oz of hard liquor.  Maintain a healthy weight or lose weight if you are overweight. If you want to lose weight, talk with your health care provider. It is important that you do not lose weight too quickly.  Start or maintain an exercise program as told by your health care provider.  Drink enough fluids to keep your urine clear or pale yellow.  Take over-the-counter and prescription medicines only as told by your health care provider.  Keep all follow-up visits as told by your health care provider. This is important. Contact a health care provider if:  You have another gout attack.  You continue to have symptoms of a gout attack  after10 days of treatment.  You have side effects from your medicines.  You have chills or a fever.  You have burning pain when you urinate.  You have pain in your lower back or belly. Get help right away if:  You have severe or uncontrolled pain.  You cannot urinate. This information is not intended to replace advice given to you by your health care provider. Make sure you discuss any questions you have with your health care provider. Document Released: 08/08/2000 Document Revised: 01/17/2016 Document Reviewed: 05/24/2015 Elsevier Interactive Patient Education  [employer].  Thank you for coming in today. I hope you feel we met your needs.  Feel free to call PCP if you have any questions or further requests.  Please consider signing up for MyChart if you do not already have it, as this is a great way to communicate with me.  Best,  Whitney McVey, PA-C  IF you received an x-ray today, you will receive an invoice from  [facility]. Please contact [facility] at [phone] with questions or concerns regarding your invoice.   IF you received labwork today, you will receive an invoice from [facility]. Please contact [facility] at [phone] with questions or concerns regarding your invoice.   Our billing staff will not be able to assist you with questions regarding bills from these companies.  You will be contacted with the lab results as soon as they are available. The fastest way to get your results is to activate your My Chart account. Instructions are located on the last page of this paperwork. If you have not heard from [place] regarding the results in 2 weeks, please contact this office.

## 2017-12-07 NOTE — Progress Notes (Signed)
Miranda [name]  MRN: [id number] DOB: 1946/02/24  PCP: Horald Pollen, MD  Subjective:  Pt is a 72 year old female PMH HTN, pre-diabetes who presents to clinic for right great toe pain x 2 days. Endorses warmth and redness of the big joint of her big toe. She could not sleep last night due to pain with the sheet touching her foot.  No recent increase in walking. No recent dietary changes.  No history of gout. She has never had this pain before.   Review of Systems  Constitutional: Negative for chills, diaphoresis, fatigue and fever.  Musculoskeletal: Positive for arthralgias, gait problem and joint swelling.  Skin: Positive for color change.  Neurological: Negative for dizziness, weakness and numbness.    Patient Active Problem List   Diagnosis Date Noted  . Sinus congestion 06/03/2017  . Sinus pressure 06/03/2017  . Acute non-recurrent maxillary sinusitis 06/03/2017  . Sinus headache 06/03/2017  . BMI 45.0-49.9, adult ([place]) 03/14/2016  . Pre-diabetes 02/28/2015  . Allergic rhinitis 12/16/2014  . Snoring 12/16/2014  . HTN (hypertension) 01/17/2012    Current Outpatient Medications on File Prior to Visit  Medication Sig Dispense Refill  . albuterol (PROVENTIL HFA;VENTOLIN HFA) 108 (90 Base) MCG/ACT inhaler Inhale 2 puffs into the lungs every 4 (four) hours as needed for wheezing or shortness of breath (cough, shortness of breath or wheezing.). 1 Inhaler 1  . atenolol-chlorthalidone (TENORETIC) 50-25 MG tablet Take 1 tablet by mouth daily. 90 tablet 3  . lisinopril (PRINIVIL,ZESTRIL) 40 MG tablet One daily 90 tablet 3  . cetirizine (ZYRTEC) 10 MG tablet Take 1 tablet (10 mg total) by mouth daily. 15 tablet 11   No current facility-administered medications on file prior to visit.     No Known Allergies   Objective:  BP 130/82   Pulse 67   Temp 98.6 F (37 C) (Oral)   Resp 16   Ht 5\' 1"  (1.549 m)   Wt 249 lb 3.2 oz (113 kg)   SpO2 100%   BMI 47.09 kg/m   Physical  Exam  Constitutional: She is oriented to person, place, and time. No distress.  Musculoskeletal:       Right foot: There is bony tenderness.       Feet:  Neurological: She is alert and oriented to person, place, and time.  Skin: Skin is warm and dry.  Psychiatric: Judgment normal.  Vitals reviewed.  Dg Toe Great Right  Result Date: 12/07/2017 CLINICAL DATA:  72 year old female with pain redness and swelling for 2 days with no known injury. Gout suspected. EXAM: RIGHT GREAT TOE COMPARISON:  Right foot series 04/20/2013 FINDINGS: Bone mineralization is within normal limits for age. Mild 1st MTP joint space loss and subchondral sclerosis appears unchanged since 2014. No periarticular erosions, but there is medial 1st MTP region soft tissue swelling. No subcutaneous gas. No radiopaque foreign body identified. No acute osseous abnormality identified. Chronic calcaneus degenerative spurring has progressed since 2014. IMPRESSION: Nonspecific soft tissue swelling about the medial right 1st MTP. No acute osseous abnormality or specific arthropathic changes for gout. Electronically Signed   By: Genevie Ann [name]   On: 12/07/2017 11:08   Results for orders placed or performed in visit on 12/07/17  POCT glucose (manual entry)  Result Value Ref Range   POC Glucose 119 (A) 70 - 99 mg/dl    Assessment and Plan :  1. Acute gout involving toe of right foot, unspecified cause - predniSONE (DELTASONE) 10 MG  tablet; Take 40mg /day x 3 days; then 30mg /dayx3 days; then 20mg /day x3 days; then 10mg /day x 3 days  Dispense: 30 tablet; Refill: 0 - Pt presents with toe pain x 2 days. HPI and PE are suspicious for gout, plan to treat with prednisone and naproxen. X-ray is negative. Anti-gout diet printed out for pt. RTC if no improvement of symptoms in 5-7 days. .  2. Arthralgia of right foot - DG Toe Great Right; Future - naproxen (NAPROSYN) 500 MG tablet; Take 1 tablet (500 mg total) by mouth 2 (two) times daily with a  meal for 5 days.  Dispense: 16 tablet; Refill: 0  3. Screening for endocrine disorder - POCT glucose (manual entry)   Mercer Pod, PA-C  [facility] 12/07/2017 10:45 AM

## 2018-01-06 ENCOUNTER — Ambulatory Visit: Payer: Medicare Other | Admitting: Adult Health

## 2018-01-06 ENCOUNTER — Telehealth: Payer: Self-pay | Admitting: *Deleted

## 2018-01-06 NOTE — Telephone Encounter (Signed)
Patient was no show for follow up with NP today.  

## 2018-01-07 ENCOUNTER — Encounter: Payer: Self-pay | Admitting: Adult Health

## 2018-03-04 ENCOUNTER — Other Ambulatory Visit: Payer: Self-pay | Admitting: Emergency Medicine

## 2018-03-04 DIAGNOSIS — I10 Essential (primary) hypertension: Secondary | ICD-10-CM

## 2018-03-27 ENCOUNTER — Encounter: Payer: Self-pay | Admitting: Urgent Care

## 2018-03-27 ENCOUNTER — Ambulatory Visit (INDEPENDENT_AMBULATORY_CARE_PROVIDER_SITE_OTHER): Payer: Medicare HMO | Admitting: Urgent Care

## 2018-03-27 ENCOUNTER — Ambulatory Visit (INDEPENDENT_AMBULATORY_CARE_PROVIDER_SITE_OTHER): Payer: Medicare HMO

## 2018-03-27 ENCOUNTER — Other Ambulatory Visit: Payer: Self-pay

## 2018-03-27 VITALS — BP 138/72 | HR 59 | Temp 97.9°F | Resp 16 | Ht 61.22 in | Wt 240.8 lb

## 2018-03-27 DIAGNOSIS — Z9109 Other allergy status, other than to drugs and biological substances: Secondary | ICD-10-CM | POA: Diagnosis not present

## 2018-03-27 DIAGNOSIS — R059 Cough, unspecified: Secondary | ICD-10-CM

## 2018-03-27 DIAGNOSIS — R0982 Postnasal drip: Secondary | ICD-10-CM

## 2018-03-27 DIAGNOSIS — I10 Essential (primary) hypertension: Secondary | ICD-10-CM

## 2018-03-27 DIAGNOSIS — R05 Cough: Secondary | ICD-10-CM | POA: Diagnosis not present

## 2018-03-27 DIAGNOSIS — Z6841 Body Mass Index (BMI) 40.0 and over, adult: Secondary | ICD-10-CM | POA: Diagnosis not present

## 2018-03-27 MED ORDER — BENZONATATE 100 MG PO CAPS: 100.0000 mg | ORAL_CAPSULE | Freq: Three times a day (TID) | ORAL | 0 refills | Status: DC | PRN

## 2018-03-27 MED ORDER — CETIRIZINE HCL 5 MG PO TABS: 5.0000 mg | ORAL_TABLET | Freq: Every day | ORAL | 3 refills | Status: DC

## 2018-03-27 MED ORDER — ATENOLOL-CHLORTHALIDONE 50-25 MG PO TABS: 1.0000 | ORAL_TABLET | Freq: Every day | ORAL | 3 refills | Status: DC

## 2018-03-27 MED ORDER — LISINOPRIL 40 MG PO TABS: ORAL_TABLET | ORAL | 3 refills | Status: DC

## 2018-03-27 NOTE — Progress Notes (Signed)
MRN: [id number] DOB: 06/17/1946  Subjective:   Miranda [name] is a 72 y.o. female presenting for 3 week history of dry cough, HTN medication refills. Patient's blood pressure is currently managed with lisinopril, atenolol-chlorthalidone. Denies fever, sinus pain, sore throat, ear pain, dizziness, chronic headache, blurred vision, chest pain, shortness of breath, heart racing, palpitations, nausea, vomiting, abdominal pain, hematuria, lower leg swelling.  Admits that she has had a very difficult time with her allergies this year but is not taking her allergy medicine Zyrtec and does not like nasal sprays.  States that Zyrtec at 10 mg has made her sleepy so she has not wanted to take it.  She reports that she has never smoked. She has never used smokeless tobacco. She reports that she drinks alcohol. She reports that she does not use drugs.   Miranda [name] has a current medication list which includes the following prescription(s): albuterol, atenolol-chlorthalidone, and lisinopril. Also has No Known Allergies.  Miranda [name]  has a past medical history of Allergy, Arthritis, and Hypertension. Also  has a past surgical history that includes Abdominal hysterectomy; Cholecystectomy; and breast biopsy (Left).  Objective:   Vitals: BP 138/72   Pulse (!) 59   Temp 97.9 F (36.6 C) (Oral)   Resp 16   Ht 5' 1.22" (1.555 m)   Wt 240 lb 12.8 oz (109.2 kg)   SpO2 98%   BMI 45.17 kg/m   Physical Exam  Constitutional: She is oriented to person, place, and time. She appears well-developed and well-nourished.  HENT:  TMs opaque bilaterally but without erythema and intact.  Mucosal edema, mild rhinorrhea present.  Throat with thick streaks of postnasal drainage.  Eyes: No scleral icterus.  Neck: Normal range of motion. Neck supple. No JVD present.  Cardiovascular: Normal rate, regular rhythm, normal heart sounds and intact distal pulses. Exam reveals no gallop and no friction rub.  No murmur heard. Pulmonary/Chest:  Effort normal and breath sounds normal. No stridor. No respiratory distress. She has no wheezes. She has no rales.  Musculoskeletal: She exhibits no edema.  Lymphadenopathy:    She has no cervical adenopathy.  Neurological: She is alert and oriented to person, place, and time.  Skin: Skin is warm and dry.  Psychiatric: She has a normal mood and affect.   Dg Chest 2 View  Result Date: 03/27/2018 CLINICAL DATA:  Three-week history of cough. EXAM: CHEST - 2 VIEW COMPARISON:  Chest x-ray dated 04/12/2007. FINDINGS: The heart size and mediastinal contours are within normal limits. Both lungs are clear. The visualized skeletal structures are unremarkable. Mild degenerative spondylosis within the midthoracic spine. IMPRESSION: No active cardiopulmonary disease. No evidence of pneumonia or pulmonary edema. Electronically Signed   By: Franki Cabot [name]   On: 03/27/2018 10:13   Assessment and Plan :   Cough - Plan: DG Chest 2 View, benzonatate (TESSALON) 100 MG capsule  Essential hypertension - Plan: Comprehensive metabolic panel, atenolol-chlorthalidone (TENORETIC) 50-25 MG tablet, lisinopril (PRINIVIL,ZESTRIL) 40 MG tablet  Post-nasal drainage - Plan: cetirizine (ZYRTEC) 5 MG tablet  Environmental allergies - Plan: cetirizine (ZYRTEC) 5 MG tablet  She is doing well with her blood pressure medications.  Refilled her lisinopril and atenolol-chlorthalidone.  Will manage her cough is a symptom of uncontrolled allergies.  Restart Zyrtec at 5 mg.  Use Tessalon for supportive care.  Consider coming off of lisinopril if cough symptoms persist.  Otherwise physical exam findings and radiology report very reassuring.  Jaynee Eagles, PA-C [facility] at [facility]  [facility] [phone] 03/27/2018  10:00 AM

## 2018-03-27 NOTE — Patient Instructions (Addendum)
Hydrate well with at least 1-2 liters (1/2-1 gallon) of water daily. For sore throat try using a honey-based tea. Use 3 teaspoons of honey with juice squeezed from half lemon. Place shaved pieces of ginger into 1/2-1 cup of water and warm over stove top. Then mix the ingredients and repeat every 4 hours as needed.  Please come back to the clinic if restarting your allergy medication of Zyrtec at 5 mg does not stop the cough.  We may need to change your blood pressure medicine, lisinopril, as this 1 is known to cause a chronic cough in some patients.     Cough, Adult Coughing is a reflex that clears your throat and your airways. Coughing helps to heal and protect your lungs. It is normal to cough occasionally, but a cough that happens with other symptoms or lasts a long time may be a sign of a condition that needs treatment. A cough may last only 2-3 weeks (acute), or it may last longer than 8 weeks (chronic). What are the causes? Coughing is commonly caused by:  Breathing in substances that irritate your lungs.  A viral or bacterial respiratory infection.  Allergies.  Asthma.  Postnasal drip.  Smoking.  Acid backing up from the stomach into the esophagus (gastroesophageal reflux).  Certain medicines.  Chronic lung problems, including COPD (or rarely, lung cancer).  Other medical conditions such as heart failure.  Follow these instructions at home: Pay attention to any changes in your symptoms. Take these actions to help with your discomfort:  Take medicines only as told by your health care provider. ? If you were prescribed an antibiotic medicine, take it as told by your health care provider. Do not stop taking the antibiotic even if you start to feel better. ? Talk with your health care provider before you take a cough suppressant medicine.  Drink enough fluid to keep your urine clear or pale yellow.  If the air is dry, use a cold steam vaporizer or humidifier in your bedroom  or your home to help loosen secretions.  Avoid anything that causes you to cough at work or at home.  If your cough is worse at night, try sleeping in a semi-upright position.  Avoid cigarette smoke. If you smoke, quit smoking. If you need help quitting, ask your health care provider.  Avoid caffeine.  Avoid alcohol.  Rest as needed.  Contact a health care provider if:  You have new symptoms.  You cough up pus.  Your cough does not get better after 2-3 weeks, or your cough gets worse.  You cannot control your cough with suppressant medicines and you are losing sleep.  You develop pain that is getting worse or pain that is not controlled with pain medicines.  You have a fever.  You have unexplained weight loss.  You have night sweats. Get help right away if:  You cough up blood.  You have difficulty breathing.  Your heartbeat is very fast. This information is not intended to replace advice given to you by your health care provider. Make sure you discuss any questions you have with your health care provider. Document Released: 02/07/2011 Document Revised: 01/17/2016 Document Reviewed: 10/18/2014 Elsevier Interactive Patient Education  2018 [employer].     Hypertension Hypertension, commonly called high blood pressure, is when the force of blood pumping through the arteries is too strong. The arteries are the blood vessels that carry blood from the heart throughout the body. Hypertension forces the heart to work  harder to pump blood and may cause arteries to become narrow or stiff. Having untreated or uncontrolled hypertension can cause heart attacks, strokes, kidney disease, and other problems. A blood pressure reading consists of a higher number over a lower number. Ideally, your blood pressure should be below 120/80. The first ("top") number is called the systolic pressure. It is a measure of the pressure in your arteries as your heart beats. The second ("bottom")  number is called the diastolic pressure. It is a measure of the pressure in your arteries as the heart relaxes. What are the causes? The cause of this condition is not known. What increases the risk? Some risk factors for high blood pressure are under your control. Others are not. Factors you can change  Smoking.  Having type 2 diabetes mellitus, high cholesterol, or both.  Not getting enough exercise or physical activity.  Being overweight.  Having too much fat, sugar, calories, or salt (sodium) in your diet.  Drinking too much alcohol. Factors that are difficult or impossible to change  Having chronic kidney disease.  Having a family history of high blood pressure.  Age. Risk increases with age.  Race. You may be at higher risk if you are African-American.  Gender. Men are at higher risk than women before age 41. After age 45, women are at higher risk than men.  Having obstructive sleep apnea.  Stress. What are the signs or symptoms? Extremely high blood pressure (hypertensive crisis) may cause:  Headache.  Anxiety.  Shortness of breath.  Nosebleed.  Nausea and vomiting.  Severe chest pain.  Jerky movements you cannot control (seizures).  How is this diagnosed? This condition is diagnosed by measuring your blood pressure while you are seated, with your arm resting on a surface. The cuff of the blood pressure monitor will be placed directly against the skin of your upper arm at the level of your heart. It should be measured at least twice using the same arm. Certain conditions can cause a difference in blood pressure between your right and left arms. Certain factors can cause blood pressure readings to be lower or higher than normal (elevated) for a short period of time:  When your blood pressure is higher when you are in a health care provider's office than when you are at home, this is called white coat hypertension. Most people with this condition do not need  medicines.  When your blood pressure is higher at home than when you are in a health care provider's office, this is called masked hypertension. Most people with this condition may need medicines to control blood pressure.  If you have a high blood pressure reading during one visit or you have normal blood pressure with other risk factors:  You may be asked to return on a different day to have your blood pressure checked again.  You may be asked to monitor your blood pressure at home for 1 week or longer.  If you are diagnosed with hypertension, you may have other blood or imaging tests to help your health care provider understand your overall risk for other conditions. How is this treated? This condition is treated by making healthy lifestyle changes, such as eating healthy foods, exercising more, and reducing your alcohol intake. Your health care provider may prescribe medicine if lifestyle changes are not enough to get your blood pressure under control, and if:  Your systolic blood pressure is above 130.  Your diastolic blood pressure is above 80.  Your personal  target blood pressure may vary depending on your medical conditions, your age, and other factors. Follow these instructions at home: Eating and drinking  Eat a diet that is high in fiber and potassium, and low in sodium, added sugar, and fat. An example eating plan is called the DASH (Dietary Approaches to Stop Hypertension) diet. To eat this way: ? Eat plenty of fresh fruits and vegetables. Try to fill half of your plate at each meal with fruits and vegetables. ? Eat whole grains, such as whole wheat pasta, brown rice, or whole grain bread. Fill about one quarter of your plate with whole grains. ? Eat or drink low-fat dairy products, such as skim milk or low-fat yogurt. ? Avoid fatty cuts of meat, processed or cured meats, and poultry with skin. Fill about one quarter of your plate with lean proteins, such as fish, chicken  without skin, beans, eggs, and tofu. ? Avoid premade and processed foods. These tend to be higher in sodium, added sugar, and fat.  Reduce your daily sodium intake. Most people with hypertension should eat less than 1,500 mg of sodium a day.  Limit alcohol intake to no more than 1 drink a day for nonpregnant women and 2 drinks a day for men. One drink equals 12 oz of beer, 5 oz of wine, or 1 oz of hard liquor. Lifestyle  Work with your health care provider to maintain a healthy body weight or to lose weight. Ask what an ideal weight is for you.  Get at least 30 minutes of exercise that causes your heart to beat faster (aerobic exercise) most days of the week. Activities may include walking, swimming, or biking.  Include exercise to strengthen your muscles (resistance exercise), such as pilates or lifting weights, as part of your weekly exercise routine. Try to do these types of exercises for 30 minutes at least 3 days a week.  Do not use any products that contain nicotine or tobacco, such as cigarettes and e-cigarettes. If you need help quitting, ask your health care provider.  Monitor your blood pressure at home as told by your health care provider.  Keep all follow-up visits as told by your health care provider. This is important. Medicines  Take over-the-counter and prescription medicines only as told by your health care provider. Follow directions carefully. Blood pressure medicines must be taken as prescribed.  Do not skip doses of blood pressure medicine. Doing this puts you at risk for problems and can make the medicine less effective.  Ask your health care provider about side effects or reactions to medicines that you should watch for. Contact a health care provider if:  You think you are having a reaction to a medicine you are taking.  You have headaches that keep coming back (recurring).  You feel dizzy.  You have swelling in your ankles.  You have trouble with your  vision. Get help right away if:  You develop a severe headache or confusion.  You have unusual weakness or numbness.  You feel faint.  You have severe pain in your chest or abdomen.  You vomit repeatedly.  You have trouble breathing. Summary  Hypertension is when the force of blood pumping through your arteries is too strong. If this condition is not controlled, it may put you at risk for serious complications.  Your personal target blood pressure may vary depending on your medical conditions, your age, and other factors. For most people, a normal blood pressure is less than 120/80.  Hypertension is treated with lifestyle changes, medicines, or a combination of both. Lifestyle changes include weight loss, eating a healthy, low-sodium diet, exercising more, and limiting alcohol. This information is not intended to replace advice given to you by your health care provider. Make sure you discuss any questions you have with your health care provider. Document Released: 08/11/2005 Document Revised: 07/09/2016 Document Reviewed: 07/09/2016 Elsevier Interactive Patient Education  2018 [employer].     IF you received an x-ray today, you will receive an invoice from [facility]. Please contact [facility] at [phone] with questions or concerns regarding your invoice.   IF you received labwork today, you will receive an invoice from [facility]. Please contact [facility] at [phone] with questions or concerns regarding your invoice.   Our billing staff will not be able to assist you with questions regarding bills from these companies.  You will be contacted with the lab results as soon as they are available. The fastest way to get your results is to activate your My Chart account. Instructions are located on the last page of this paperwork. If you have not heard from [place] regarding the results in 2 weeks, please contact this office.

## 2018-03-28 LAB — COMPREHENSIVE METABOLIC PANEL
ALBUMIN: 4 g/dL (ref 3.5–4.8)
ALT: 9 IU/L (ref 0–32)
AST: 12 IU/L (ref 0–40)
Albumin/Globulin Ratio: 1.3 (ref 1.2–2.2)
Alkaline Phosphatase: 99 IU/L (ref 39–117)
BUN / CREAT RATIO: 16 (ref 12–28)
BUN: 20 mg/dL (ref 8–27)
Bilirubin Total: 0.2 mg/dL (ref 0.0–1.2)
CALCIUM: 9.6 mg/dL (ref 8.7–10.3)
CO2: 28 mmol/L (ref 20–29)
Chloride: 103 mmol/L (ref 96–106)
Creatinine, Ser: 1.24 mg/dL — ABNORMAL HIGH (ref 0.57–1.00)
GFR, EST AFRICAN AMERICAN: 51 mL/min/{1.73_m2} — AB (ref >59)
GFR, EST NON AFRICAN AMERICAN: 44 mL/min/{1.73_m2} — AB (ref >59)
Globulin, Total: 3 g/dL (ref 1.5–4.5)
Glucose: 97 mg/dL (ref 65–99)
Potassium: 3.7 mmol/L (ref 3.5–5.2)
Sodium: 144 mmol/L (ref 134–144)
TOTAL PROTEIN: 7 g/dL (ref 6.0–8.5)

## 2018-04-22 ENCOUNTER — Other Ambulatory Visit: Payer: Self-pay | Admitting: Emergency Medicine

## 2018-04-22 DIAGNOSIS — I10 Essential (primary) hypertension: Secondary | ICD-10-CM

## 2018-05-06 DIAGNOSIS — H5203 Hypermetropia, bilateral: Secondary | ICD-10-CM | POA: Diagnosis not present

## 2018-05-06 DIAGNOSIS — H52209 Unspecified astigmatism, unspecified eye: Secondary | ICD-10-CM | POA: Diagnosis not present

## 2018-05-06 DIAGNOSIS — H524 Presbyopia: Secondary | ICD-10-CM | POA: Diagnosis not present

## 2018-05-17 ENCOUNTER — Other Ambulatory Visit: Payer: Self-pay | Admitting: Emergency Medicine

## 2018-05-17 ENCOUNTER — Ambulatory Visit (INDEPENDENT_AMBULATORY_CARE_PROVIDER_SITE_OTHER): Payer: Medicare HMO | Admitting: Emergency Medicine

## 2018-05-17 ENCOUNTER — Other Ambulatory Visit: Payer: Self-pay

## 2018-05-17 ENCOUNTER — Encounter: Payer: Self-pay | Admitting: Emergency Medicine

## 2018-05-17 VITALS — BP 134/74 | HR 63 | Temp 99.5°F | Resp 16 | Wt 239.0 lb

## 2018-05-17 DIAGNOSIS — M79675 Pain in left toe(s): Secondary | ICD-10-CM

## 2018-05-17 DIAGNOSIS — M109 Gout, unspecified: Secondary | ICD-10-CM

## 2018-05-17 DIAGNOSIS — Z13820 Encounter for screening for osteoporosis: Secondary | ICD-10-CM

## 2018-05-17 DIAGNOSIS — Z23 Encounter for immunization: Secondary | ICD-10-CM

## 2018-05-17 MED ORDER — PREDNISONE 20 MG PO TABS: 40.0000 mg | ORAL_TABLET | Freq: Every day | ORAL | 0 refills | Status: AC

## 2018-05-17 MED ORDER — INDOMETHACIN 50 MG PO CAPS: 50.0000 mg | ORAL_CAPSULE | Freq: Three times a day (TID) | ORAL | 1 refills | Status: AC

## 2018-05-17 NOTE — Progress Notes (Signed)
Miranda [name] 72 y.o.   Chief Complaint  Patient presents with  . Foot Pain    LEFT "Great Toe" x 2 days    HISTORY OF PRESENT ILLNESS: This is a 72 y.o. female complaining of gout attack to left great toe for the last 2 days.  Denies injury.  Denies fever or chills.  No other significant symptoms.  HPI   Prior to Admission medications   Medication Sig Start Date End Date Taking? Authorizing Provider  albuterol (PROVENTIL HFA;VENTOLIN HFA) 108 (90 Base) MCG/ACT inhaler Inhale 2 puffs into the lungs every 4 (four) hours as needed for wheezing or shortness of breath (cough, shortness of breath or wheezing.). 04/12/16  Yes Robyn Haber, MD  atenolol-chlorthalidone (TENORETIC) 50-25 MG tablet Take 1 tablet by mouth daily. 03/27/18  Yes Jaynee Eagles, PA-C  benzonatate (TESSALON) 100 MG capsule Take 1-2 capsules (100-200 mg total) by mouth 3 (three) times daily as needed. 03/27/18  Yes Jaynee Eagles, PA-C  cetirizine (ZYRTEC) 5 MG tablet Take 1 tablet (5 mg total) by mouth daily. 03/27/18  Yes Jaynee Eagles, PA-C  lisinopril (PRINIVIL,ZESTRIL) 40 MG tablet One daily 03/27/18  Yes Jaynee Eagles, PA-C    No Known Allergies  Patient Active Problem List   Diagnosis Date Noted  . Environmental allergies 03/27/2018  . Cough 03/27/2018  . Acute gout involving toe of right foot 12/07/2017  . Sinus congestion 06/03/2017  . Sinus pressure 06/03/2017  . Acute non-recurrent maxillary sinusitis 06/03/2017  . Sinus headache 06/03/2017  . BMI 45.0-49.9, adult ([place]) 03/14/2016  . Pre-diabetes 02/28/2015  . Allergic rhinitis 12/16/2014  . Snoring 12/16/2014  . HTN (hypertension) 01/17/2012    Past Medical History:  Diagnosis Date  . Allergy   . Arthritis   . Hypertension     Past Surgical History:  Procedure Laterality Date  . ABDOMINAL HYSTERECTOMY    . breast biopsy Left    benign  . CHOLECYSTECTOMY      Social History   Socioeconomic History  . Marital status: Married    Spouse name:  Sheppard Coil  . Number of children: 2  . Years of education: HS  . Highest education level: Not on file  Occupational History  . Occupation: Deli Counter    Comment: [employer]  Social Needs  . Financial resource strain: Not on file  . Food insecurity:    Worry: Not on file    Inability: Not on file  . Transportation needs:    Medical: Not on file    Non-medical: Not on file  Tobacco Use  . Smoking status: Never Smoker  . Smokeless tobacco: Never Used  Substance and Sexual Activity  . Alcohol use: Yes    Alcohol/week: 0.0 standard drinks    Comment: very seldom  . Drug use: No  . Sexual activity: Never  Lifestyle  . Physical activity:    Days per week: Not on file    Minutes per session: Not on file  . Stress: Not on file  Relationships  . Social connections:    Talks on phone: Not on file    Gets together: Not on file    Attends religious service: Not on file    Active member of club or organization: Not on file    Attends meetings of clubs or organizations: Not on file    Relationship status: Not on file  . Intimate partner violence:    Fear of current or ex partner: Not on file  Emotionally abused: Not on file    Physically abused: Not on file    Forced sexual activity: Not on file  Other Topics Concern  . Not on file  Social History Narrative   1-2 sodas a day, daily consumption of tea   Lives with her husband and and their youngest daughter.    Family History  Problem Relation Age of Onset  . Stroke Brother   . Alzheimer's disease Brother   . Asthma Daughter   . Diabetes Sister   . Heart disease Sister   . Mental illness Sister        depression, anxiety, resolved with treatment  . Graves' disease Daughter   . Colon cancer Neg Hx   . Esophageal cancer Neg Hx   . Rectal cancer Neg Hx   . Stomach cancer Neg Hx      Review of Systems  Constitutional: Negative.  Negative for chills and fever.  HENT: Negative.  Negative for sore throat.   Eyes:  Negative.  Negative for blurred vision.  Respiratory: Negative.  Negative for shortness of breath.   Cardiovascular: Negative.  Negative for chest pain and palpitations.  Gastrointestinal: Negative.  Negative for nausea and vomiting.  Musculoskeletal: Positive for joint pain (toe pain).  Neurological: Negative.  Negative for dizziness and headaches.  Endo/Heme/Allergies: Negative.   All other systems reviewed and are negative.  Vitals:   05/17/18 1629  BP: 134/74  Pulse: 63  Resp: 16  Temp: 99.5 F (37.5 C)  SpO2: 98%     Physical Exam  Constitutional: She is oriented to person, place, and time. She appears well-developed and well-nourished.  HENT:  Head: Normocephalic and atraumatic.  Eyes: Pupils are equal, round, and reactive to light. EOM are normal.  Neck: Normal range of motion. Neck supple.  Cardiovascular: Normal rate and regular rhythm.  Pulmonary/Chest: Effort normal and breath sounds normal.  Musculoskeletal:  Left great toe: Positive erythema with swelling and tenderness with limited range of motion.  Neurological: She is alert and oriented to person, place, and time. No sensory deficit. She exhibits normal muscle tone. Coordination normal.  Skin: Skin is warm and dry. Capillary refill takes less than 2 seconds.  Psychiatric: She has a normal mood and affect. Her behavior is normal.  Vitals reviewed.   A total of 25 minutes was spent in the room with the patient, greater than 50% of which was in counseling/coordination of care regarding diagnosis, management, medications, and need for follow-up if no better or worse..  ASSESSMENT & PLAN: Miranda [name] was seen today for foot pain.  Diagnoses and all orders for this visit:  Pain of left great toe -     predniSONE (DELTASONE) 20 MG tablet; Take 2 tablets (40 mg total) by mouth daily with breakfast for 5 days. -     indomethacin (INDOCIN) 50 MG capsule; Take 1 capsule (50 mg total) by mouth 3 (three) times daily with  meals for 5 days.  Acute gout involving toe of left foot, unspecified cause -     predniSONE (DELTASONE) 20 MG tablet; Take 2 tablets (40 mg total) by mouth daily with breakfast for 5 days. -     indomethacin (INDOCIN) 50 MG capsule; Take 1 capsule (50 mg total) by mouth 3 (three) times daily with meals for 5 days.  Osteoporosis screening -     DG Bone Density; Future    Patient Instructions       If you have lab work done today  you will be contacted with your lab results within the next 2 weeks.  If you have not heard from [place] then please contact us. The fastest way to get your results is to register for My Chart.   IF you received an x-ray today, you will receive an invoice from [facility]. Please contact [facility] at [phone] with questions or concerns regarding your invoice.   IF you received labwork today, you will receive an invoice from [place]. Please contact [facility] at [phone] with questions or concerns regarding your invoice.   Our billing staff will not be able to assist you with questions regarding bills from these companies.  You will be contacted with the lab results as soon as they are available. The fastest way to get your results is to activate your My Chart account. Instructions are located on the last page of this paperwork. If you have not heard from [place] regarding the results in 2 weeks, please contact this office.     Gout Gout is painful swelling that can happen in some of your joints. Gout is a type of arthritis. This condition is caused by having too much uric acid in your body. Uric acid is a chemical that is made when your body breaks down substances called purines. If your body has too much uric acid, sharp crystals can form and build up in your joints. This causes pain and swelling. Gout attacks can happen quickly and be very painful (acute gout). Over time, the attacks can affect more joints and happen more often (chronic  gout). Follow these instructions at home: During a Gout Attack  If directed, put ice on the painful area: ? Put ice in a plastic bag. ? Place a towel between your skin and the bag. ? Leave the ice on for 20 minutes, 2-3 times a day.  Rest the joint as much as possible. If the joint is in your leg, you may be given crutches to use.  Raise (elevate) the painful joint above the level of your heart as often as you can.  Drink enough fluids to keep your pee (urine) clear or pale yellow.  Take over-the-counter and prescription medicines only as told by your doctor.  Do not drive or use heavy machinery while taking prescription pain medicine.  Follow instructions from your doctor about what you can or cannot eat and drink.  Return to your normal activities as told by your doctor. Ask your doctor what activities are safe for you. Avoiding Future Gout Attacks  Follow a low-purine diet as told by a specialist (dietitian) or your doctor. Avoid foods and drinks that have a lot of purines, such as: ? Liver. ? Kidney. ? Anchovies. ? Asparagus. ? Herring. ? Mushrooms ? Mussels. ? Beer.  Limit alcohol intake to no more than 1 drink a day for nonpregnant women and 2 drinks a day for men. One drink equals 12 oz of beer, 5 oz of wine, or 1 oz of hard liquor.  Stay at a healthy weight or lose weight if you are overweight. If you want to lose weight, talk with your doctor. It is important that you do not lose weight too fast.  Start or continue an exercise plan as told by your doctor.  Drink enough fluids to keep your pee clear or pale yellow.  Take over-the-counter and prescription medicines only as told by your doctor.  Keep all follow-up visits as told by your doctor. This is important. Contact a doctor if:  You have another gout attack.  You still have symptoms of a gout attack after10 days of treatment.  You have problems (side effects) because of your medicines.  You have  chills or a fever.  You have burning pain when you pee (urinate).  You have pain in your lower back or belly. Get help right away if:  You have very bad pain.  Your pain cannot be controlled.  You cannot pee. This information is not intended to replace advice given to you by your health care provider. Make sure you discuss any questions you have with your health care provider. Document Released: 05/20/2008 Document Revised: 01/17/2016 Document Reviewed: 05/24/2015 Elsevier Interactive Patient Education  2018 Elsevier Inc.      Agustina Caroli, MD [facility]

## 2018-05-17 NOTE — Patient Instructions (Addendum)
   If you have lab work done today you will be contacted with your lab results within the next 2 weeks.  If you have not heard from us then please contact us. The fastest way to get your results is to register for My Chart.   IF you received an x-ray today, you will receive an invoice from [facility]. Please contact [facility] at [phone] with questions or concerns regarding your invoice.   IF you received labwork today, you will receive an invoice from [facility]. Please contact [facility] at [phone] with questions or concerns regarding your invoice.   Our billing staff will not be able to assist you with questions regarding bills from these companies.  You will be contacted with the lab results as soon as they are available. The fastest way to get your results is to activate your My Chart account. Instructions are located on the last page of this paperwork. If you have not heard from us regarding the results in 2 weeks, please contact this office.     Gout Gout is painful swelling that can happen in some of your joints. Gout is a type of arthritis. This condition is caused by having too much uric acid in your body. Uric acid is a chemical that is made when your body breaks down substances called purines. If your body has too much uric acid, sharp crystals can form and build up in your joints. This causes pain and swelling. Gout attacks can happen quickly and be very painful (acute gout). Over time, the attacks can affect more joints and happen more often (chronic gout). Follow these instructions at home: During a Gout Attack  If directed, put ice on the painful area: ? Put ice in a plastic bag. ? Place a towel between your skin and the bag. ? Leave the ice on for 20 minutes, 2-3 times a day.  Rest the joint as much as possible. If the joint is in your leg, you may be given crutches to use.  Raise (elevate) the painful joint above the level of your heart as  often as you can.  Drink enough fluids to keep your pee (urine) clear or pale yellow.  Take over-the-counter and prescription medicines only as told by your doctor.  Do not drive or use heavy machinery while taking prescription pain medicine.  Follow instructions from your doctor about what you can or cannot eat and drink.  Return to your normal activities as told by your doctor. Ask your doctor what activities are safe for you. Avoiding Future Gout Attacks  Follow a low-purine diet as told by a specialist (dietitian) or your doctor. Avoid foods and drinks that have a lot of purines, such as: ? Liver. ? Kidney. ? Anchovies. ? Asparagus. ? Herring. ? Mushrooms ? Mussels. ? Beer.  Limit alcohol intake to no more than 1 drink a day for nonpregnant women and 2 drinks a day for men. One drink equals 12 oz of beer, 5 oz of wine, or 1 oz of hard liquor.  Stay at a healthy weight or lose weight if you are overweight. If you want to lose weight, talk with your doctor. It is important that you do not lose weight too fast.  Start or continue an exercise plan as told by your doctor.  Drink enough fluids to keep your pee clear or pale yellow.  Take over-the-counter and prescription medicines only as told by your doctor.  Keep all follow-up visits as told   by your doctor. This is important. Contact a doctor if:  You have another gout attack.  You still have symptoms of a gout attack after10 days of treatment.  You have problems (side effects) because of your medicines.  You have chills or a fever.  You have burning pain when you pee (urinate).  You have pain in your lower back or belly. Get help right away if:  You have very bad pain.  Your pain cannot be controlled.  You cannot pee. This information is not intended to replace advice given to you by your health care provider. Make sure you discuss any questions you have with your health care provider. Document Released:  05/20/2008 Document Revised: 01/17/2016 Document Reviewed: 05/24/2015 Elsevier Interactive Patient Education  2018 Elsevier Inc.  

## 2018-05-17 NOTE — Addendum Note (Signed)
Addended by: Alfredia Ferguson A on: 05/17/2018 05:03 PM   Modules accepted: Orders

## 2018-06-11 ENCOUNTER — Ambulatory Visit: Payer: Self-pay | Admitting: *Deleted

## 2018-06-11 NOTE — Telephone Encounter (Signed)
Pt advised.

## 2018-06-11 NOTE — Telephone Encounter (Signed)
Pt called with wanting an appointment for pain with her big toe on the left foot. She states the pain is in between the big toe and the one next to it. She states her toe is a little swollen. She can bend her toe down without discomfort, but pointing upward is painful. There is no redness or drainage. She thinks that this is a different feeling from gout. She is taking an NSAID for pain and it has helped some. [facility] notified regarding an appointment for tomorrow. No availability. Per protocol, to be seen at an urgent care. Pt is going to bible study right now , so she stated she would. Recommended that if she starts having increase in pain, swelling or difficulty walking she needs to be seen at an urgent care. Pt voiced understanding.  Reason for Disposition . Pain in the big toe joint    Pain in between her big toe and the next toe.  Answer Assessment - Initial Assessment Questions 1. ONSET: "When did the pain start?"      A couple of days ago 2. LOCATION: "Where is the pain located?"      Left foot, big toe 3. PAIN: "How bad is the pain?"    (Scale 1-10; or mild, moderate, severe)   -  MILD (1-3): doesn't interfere with normal activities    -  MODERATE (4-7): interferes with normal activities (e.g., work or school) or awakens from sleep, limping    -  SEVERE (8-10): excruciating pain, unable to do any normal activities, unable to walk     Pain # 5 4. WORK OR EXERCISE: "Has there been any recent work or exercise that involved this part of the body?"      don't think so 5. CAUSE: "What do you think is causing the foot pain?"     Not sure 6. OTHER SYMPTOMS: "Do you have any other symptoms?" (e.g., leg pain, rash, fever, numbness)     no  Protocols used: FOOT PAIN-A-AH

## 2018-06-12 ENCOUNTER — Other Ambulatory Visit: Payer: Self-pay | Admitting: Emergency Medicine

## 2018-06-12 DIAGNOSIS — M79675 Pain in left toe(s): Secondary | ICD-10-CM

## 2018-06-12 DIAGNOSIS — M109 Gout, unspecified: Secondary | ICD-10-CM

## 2018-06-14 ENCOUNTER — Other Ambulatory Visit: Payer: Self-pay

## 2018-06-14 ENCOUNTER — Other Ambulatory Visit: Payer: Self-pay | Admitting: Family Medicine

## 2018-06-14 ENCOUNTER — Encounter: Payer: Self-pay | Admitting: Family Medicine

## 2018-06-14 ENCOUNTER — Ambulatory Visit (INDEPENDENT_AMBULATORY_CARE_PROVIDER_SITE_OTHER): Payer: Medicare HMO | Admitting: Family Medicine

## 2018-06-14 ENCOUNTER — Ambulatory Visit: Payer: Medicare HMO | Admitting: Family Medicine

## 2018-06-14 VITALS — BP 126/88 | HR 65 | Temp 97.6°F | Ht 60.0 in | Wt 243.0 lb

## 2018-06-14 DIAGNOSIS — Z6841 Body Mass Index (BMI) 40.0 and over, adult: Secondary | ICD-10-CM | POA: Diagnosis not present

## 2018-06-14 DIAGNOSIS — M109 Gout, unspecified: Secondary | ICD-10-CM | POA: Diagnosis not present

## 2018-06-14 MED ORDER — INDOMETHACIN 50 MG PO CAPS: 50.0000 mg | ORAL_CAPSULE | Freq: Three times a day (TID) | ORAL | 0 refills | Status: DC | PRN

## 2018-06-14 NOTE — Patient Instructions (Addendum)
Indomethacin up to 3 times per day with food as needed.  I expect the swelling and pain to continue to improve this week.  If any worsening symptoms please return for recheck.  Please follow-up in the next 4 weeks for blood testing and discussion of gout treatment if needed at that time.  Thank you for coming in today.   Return to the clinic or go to the nearest emergency room if any of your symptoms worsen or new symptoms occur.   Gout Gout is painful swelling that can occur in some of your joints. Gout is a type of arthritis. This condition is caused by having too much uric acid in your body. Uric acid is a chemical that forms when your body breaks down substances called purines. Purines are important for building body proteins. When your body has too much uric acid, sharp crystals can form and build up inside your joints. This causes pain and swelling. Gout attacks can happen quickly and be very painful (acute gout). Over time, the attacks can affect more joints and become more frequent (chronic gout). Gout can also cause uric acid to build up under your skin and inside your kidneys. What are the causes? This condition is caused by too much uric acid in your blood. This can occur because:  Your kidneys do not remove enough uric acid from your blood. This is the most common cause.  Your body makes too much uric acid. This can occur with some cancers and cancer treatments. It can also occur if your body is breaking down too many red blood cells (hemolytic anemia).  You eat too many foods that are high in purines. These foods include organ meats and some seafood. Alcohol, especially beer, is also high in purines.  A gout attack may be triggered by trauma or stress. What increases the risk? This condition is more likely to develop in people who:  Have a family history of gout.  Are female and middle-aged.  Are female and have gone through menopause.  Are obese.  Frequently drink  alcohol, especially beer.  Are dehydrated.  Lose weight too quickly.  Have an organ transplant.  Have lead poisoning.  Take certain medicines, including aspirin, cyclosporine, diuretics, levodopa, and niacin.  Have kidney disease or psoriasis.  What are the signs or symptoms? An attack of acute gout happens quickly. It usually occurs in just one joint. The most common place is the big toe. Attacks often start at night. Other joints that may be affected include joints of the feet, ankle, knee, fingers, wrist, or elbow. Symptoms may include:  Severe pain.  Warmth.  Swelling.  Stiffness.  Tenderness. The affected joint may be very painful to touch.  Shiny, red, or purple skin.  Chills and fever.  Chronic gout may cause symptoms more frequently. More joints may be involved. You may also have white or yellow lumps (tophi) on your hands or feet or in other areas near your joints. How is this diagnosed? This condition is diagnosed based on your symptoms, medical history, and physical exam. You may have tests, such as:  Blood tests to measure uric acid levels.  Removal of joint fluid with a needle (aspiration) to look for uric acid crystals.  X-rays to look for joint damage.  How is this treated? Treatment for this condition has two phases: treating an acute attack and preventing future attacks. Acute gout treatment may include medicines to reduce pain and swelling, including:  NSAIDs.  Steroids. These  are strong anti-inflammatory medicines that can be taken by mouth (orally) or injected into a joint.  Colchicine. This medicine relieves pain and swelling when it is taken soon after an attack. It can be given orally or through an IV tube.  Preventive treatment may include:  Daily use of smaller doses of NSAIDs or colchicine.  Use of a medicine that reduces uric acid levels in your blood.  Changes to your diet. You may need to see a specialist about healthy eating  (dietitian).  Follow these instructions at home: During a Gout Attack  If directed, apply ice to the affected area: ? Put ice in a plastic bag. ? Place a towel between your skin and the bag. ? Leave the ice on for 20 minutes, 2-3 times a day.  Rest the joint as much as possible. If the affected joint is in your leg, you may be given crutches to use.  Raise (elevate) the affected joint above the level of your heart as often as possible.  Drink enough fluids to keep your urine clear or pale yellow.  Take over-the-counter and prescription medicines only as told by your health care provider.  Do not drive or operate heavy machinery while taking prescription pain medicine.  Follow instructions from your health care provider about eating or drinking restrictions.  Return to your normal activities as told by your health care provider. Ask your health care provider what activities are safe for you. Avoiding Future Gout Attacks  Follow a low-purine diet as told by your dietitian or health care provider. Avoid foods and drinks that are high in purines, including liver, kidney, anchovies, asparagus, herring, mushrooms, mussels, and beer.  Limit alcohol intake to no more than 1 drink a day for nonpregnant women and 2 drinks a day for men. One drink equals 12 oz of beer, 5 oz of wine, or 1 oz of hard liquor.  Maintain a healthy weight or lose weight if you are overweight. If you want to lose weight, talk with your health care provider. It is important that you do not lose weight too quickly.  Start or maintain an exercise program as told by your health care provider.  Drink enough fluids to keep your urine clear or pale yellow.  Take over-the-counter and prescription medicines only as told by your health care provider.  Keep all follow-up visits as told by your health care provider. This is important. Contact a health care provider if:  You have another gout attack.  You continue to  have symptoms of a gout attack after10 days of treatment.  You have side effects from your medicines.  You have chills or a fever.  You have burning pain when you urinate.  You have pain in your lower back or belly. Get help right away if:  You have severe or uncontrolled pain.  You cannot urinate. This information is not intended to replace advice given to you by your health care provider. Make sure you discuss any questions you have with your health care provider. Document Released: 08/08/2000 Document Revised: 01/17/2016 Document Reviewed: 05/24/2015 Elsevier Interactive Patient Education  [employer].   If you have lab work done today you will be contacted with your lab results within the next 2 weeks.  If you have not heard from [place] then please contact us. The fastest way to get your results is to register for My Chart.   IF you received an x-ray today, you will receive an invoice from [facility]  Rad[facility]. Please contact [facility] at [phone] with questions or concerns regarding your invoice.   IF you received labwork today, you will receive an invoice from [facility]. Please contact [facility] at [phone] with questions or concerns regarding your invoice.   Our billing staff will not be able to assist you with questions regarding bills from these companies.  You will be contacted with the lab results as soon as they are available. The fastest way to get your results is to activate your My Chart account. Instructions are located on the last page of this paperwork. If you have not heard from [place] regarding the results in 2 weeks, please contact this office.

## 2018-06-14 NOTE — Progress Notes (Signed)
Subjective:  By signing my name below, I, Moises Blood, attest that this documentation has been prepared under the direction and in the presence of Merri Ray, MD. Electronically Signed: Moises Blood, [place]. 06/14/2018 , 5:29 PM .  Patient was seen in Room 12 .   Patient ID: ELLENE BLOODSAW, female    DOB: [birth year], 72 y.o.   MRN: [id number] Chief Complaint  Patient presents with  . left foot swelling    started last wed. painful    HPI Tava P Howden is a 72 y.o. female  Patient states foot pain and swelling started out of the blue about 5 days ago. She denies any change in activity or any known specific injury. She could barely walk on it on Friday/Saturday. She's been taking Advil 2-3 tablets bid over the past few days. She denies any blisters or wounds. She denies history of stomach ulcers.   She was seen in April for gout of the right great toe pain for 2 days. She was also seen for gout on Sept 23rd in left great toe, treated with prednisone 40 mg for 5 days and indomethacin. Her gout flare had resolved at that time. She mentions this didn't feel like gout.   Patient Active Problem List   Diagnosis Date Noted  . Environmental allergies 03/27/2018  . Cough 03/27/2018  . Acute gout involving toe of right foot 12/07/2017  . Sinus congestion 06/03/2017  . Sinus pressure 06/03/2017  . Acute non-recurrent maxillary sinusitis 06/03/2017  . Sinus headache 06/03/2017  . BMI 45.0-49.9, adult ([place]) 03/14/2016  . Pre-diabetes 02/28/2015  . Allergic rhinitis 12/16/2014  . Snoring 12/16/2014  . HTN (hypertension) 01/17/2012   Past Medical History:  Diagnosis Date  . Allergy   . Arthritis   . Hypertension    Past Surgical History:  Procedure Laterality Date  . ABDOMINAL HYSTERECTOMY    . breast biopsy Left    benign  . CHOLECYSTECTOMY     No Known Allergies Prior to Admission medications   Medication Sig Start Date End Date Taking? Authorizing Provider  albuterol  (PROVENTIL HFA;VENTOLIN HFA) 108 (90 Base) MCG/ACT inhaler Inhale 2 puffs into the lungs every 4 (four) hours as needed for wheezing or shortness of breath (cough, shortness of breath or wheezing.). 04/12/16   Robyn Haber, MD  atenolol-chlorthalidone (TENORETIC) 50-25 MG tablet Take 1 tablet by mouth daily. 03/27/18   Jaynee Eagles, PA-C  benzonatate (TESSALON) 100 MG capsule Take 1-2 capsules (100-200 mg total) by mouth 3 (three) times daily as needed. 03/27/18   Jaynee Eagles, PA-C  cetirizine (ZYRTEC) 5 MG tablet Take 1 tablet (5 mg total) by mouth daily. 03/27/18   Jaynee Eagles, PA-C  lisinopril (PRINIVIL,ZESTRIL) 40 MG tablet One daily 03/27/18   Jaynee Eagles, PA-C   Social History   Socioeconomic History  . Marital status: Married    Spouse name: Sheppard Coil  . Number of children: 2  . Years of education: HS  . Highest education level: Not on file  Occupational History  . Occupation: Deli Counter    Comment: [employer]  Social Needs  . Financial resource strain: Not on file  . Food insecurity:    Worry: Not on file    Inability: Not on file  . Transportation needs:    Medical: Not on file    Non-medical: Not on file  Tobacco Use  . Smoking status: Never Smoker  . Smokeless tobacco: Never Used  Substance and Sexual Activity  . Alcohol  use: Yes    Alcohol/week: 0.0 standard drinks    Comment: very seldom  . Drug use: No  . Sexual activity: Never  Lifestyle  . Physical activity:    Days per week: Not on file    Minutes per session: Not on file  . Stress: Not on file  Relationships  . Social connections:    Talks on phone: Not on file    Gets together: Not on file    Attends religious service: Not on file    Active member of club or organization: Not on file    Attends meetings of clubs or organizations: Not on file    Relationship status: Not on file  . Intimate partner violence:    Fear of current or ex partner: Not on file    Emotionally abused: Not on file    Physically  abused: Not on file    Forced sexual activity: Not on file  Other Topics Concern  . Not on file  Social History Narrative   1-2 sodas a day, daily consumption of tea   Lives with her husband and and their youngest daughter.   Review of Systems  Constitutional: Negative for chills, fatigue, fever and unexpected weight change.  Respiratory: Negative for cough.   Gastrointestinal: Negative for constipation, diarrhea, nausea and vomiting.  Musculoskeletal: Positive for joint swelling and myalgias.  Skin: Negative for rash and wound.  Neurological: Negative for dizziness, weakness and headaches.       Objective:   Physical Exam  Constitutional: She is oriented to person, place, and time. She appears well-developed and well-nourished. No distress.  HENT:  Head: Normocephalic and atraumatic.  Eyes: Pupils are equal, round, and reactive to light. EOM are normal.  Neck: Neck supple.  Cardiovascular: Normal rate.  Pulmonary/Chest: Effort normal. No respiratory distress.  Musculoskeletal: Normal range of motion.  Left foot: tender at the 2nd MTP, plantar and dorsal foot without any wounds, no interdigital skin breakdown  Neurological: She is alert and oriented to person, place, and time.  Skin: Skin is warm and dry.  Psychiatric: She has a normal mood and affect. Her behavior is normal.  Nursing note and vitals reviewed.   Vitals:   06/14/18 1710 06/14/18 1712  BP: (!) 156/80 126/88  Pulse: 65   Temp: 97.6 F (36.4 C)   TempSrc: Oral   SpO2: 99%   Weight: 243 lb (110.2 kg)   Height: 5' (1.524 m)         Assessment & Plan:    Alicea Wente Beyene is a 72 y.o. female Acute gout of left foot, unspecified cause pain and swelling likely due to flare of gout, but improving.   - Decided to try indomethacin as needed for now with food, side effects and risk discussed.  Hold on further doses of prednisone at this time.  -  Plan for recheck in the next 4 weeks or so for uric acid testing  when not having a gout flare.  RTC precautions if worse.   Meds ordered this encounter  Medications  . indomethacin (INDOCIN) 50 MG capsule    Sig: Take 1 capsule (50 mg total) by mouth 3 (three) times daily as needed (with food.).    Dispense:  30 capsule    Refill:  0   Patient Instructions    Indomethacin up to 3 times per day with food as needed.  I expect the swelling and pain to continue to improve this week.  If any  worsening symptoms please return for recheck.  Please follow-up in the next 4 weeks for blood testing and discussion of gout treatment if needed at that time.  Thank you for coming in today.   Return to the clinic or go to the nearest emergency room if any of your symptoms worsen or new symptoms occur.   Gout Gout is painful swelling that can occur in some of your joints. Gout is a type of arthritis. This condition is caused by having too much uric acid in your body. Uric acid is a chemical that forms when your body breaks down substances called purines. Purines are important for building body proteins. When your body has too much uric acid, sharp crystals can form and build up inside your joints. This causes pain and swelling. Gout attacks can happen quickly and be very painful (acute gout). Over time, the attacks can affect more joints and become more frequent (chronic gout). Gout can also cause uric acid to build up under your skin and inside your kidneys. What are the causes? This condition is caused by too much uric acid in your blood. This can occur because:  Your kidneys do not remove enough uric acid from your blood. This is the most common cause.  Your body makes too much uric acid. This can occur with some cancers and cancer treatments. It can also occur if your body is breaking down too many red blood cells (hemolytic anemia).  You eat too many foods that are high in purines. These foods include organ meats and some seafood. Alcohol, especially beer, is  also high in purines.  A gout attack may be triggered by trauma or stress. What increases the risk? This condition is more likely to develop in people who:  Have a family history of gout.  Are female and middle-aged.  Are female and have gone through menopause.  Are obese.  Frequently drink alcohol, especially beer.  Are dehydrated.  Lose weight too quickly.  Have an organ transplant.  Have lead poisoning.  Take certain medicines, including aspirin, cyclosporine, diuretics, levodopa, and niacin.  Have kidney disease or psoriasis.  What are the signs or symptoms? An attack of acute gout happens quickly. It usually occurs in just one joint. The most common place is the big toe. Attacks often start at night. Other joints that may be affected include joints of the feet, ankle, knee, fingers, wrist, or elbow. Symptoms may include:  Severe pain.  Warmth.  Swelling.  Stiffness.  Tenderness. The affected joint may be very painful to touch.  Shiny, red, or purple skin.  Chills and fever.  Chronic gout may cause symptoms more frequently. More joints may be involved. You may also have white or yellow lumps (tophi) on your hands or feet or in other areas near your joints. How is this diagnosed? This condition is diagnosed based on your symptoms, medical history, and physical exam. You may have tests, such as:  Blood tests to measure uric acid levels.  Removal of joint fluid with a needle (aspiration) to look for uric acid crystals.  X-rays to look for joint damage.  How is this treated? Treatment for this condition has two phases: treating an acute attack and preventing future attacks. Acute gout treatment may include medicines to reduce pain and swelling, including:  NSAIDs.  Steroids. These are strong anti-inflammatory medicines that can be taken by mouth (orally) or injected into a joint.  Colchicine. This medicine relieves pain and swelling when it is  taken soon  after an attack. It can be given orally or through an IV tube.  Preventive treatment may include:  Daily use of smaller doses of NSAIDs or colchicine.  Use of a medicine that reduces uric acid levels in your blood.  Changes to your diet. You may need to see a specialist about healthy eating (dietitian).  Follow these instructions at home: During a Gout Attack  If directed, apply ice to the affected area: ? Put ice in a plastic bag. ? Place a towel between your skin and the bag. ? Leave the ice on for 20 minutes, 2-3 times a day.  Rest the joint as much as possible. If the affected joint is in your leg, you may be given crutches to use.  Raise (elevate) the affected joint above the level of your heart as often as possible.  Drink enough fluids to keep your urine clear or pale yellow.  Take over-the-counter and prescription medicines only as told by your health care provider.  Do not drive or operate heavy machinery while taking prescription pain medicine.  Follow instructions from your health care provider about eating or drinking restrictions.  Return to your normal activities as told by your health care provider. Ask your health care provider what activities are safe for you. Avoiding Future Gout Attacks  Follow a low-purine diet as told by your dietitian or health care provider. Avoid foods and drinks that are high in purines, including liver, kidney, anchovies, asparagus, herring, mushrooms, mussels, and beer.  Limit alcohol intake to no more than 1 drink a day for nonpregnant women and 2 drinks a day for men. One drink equals 12 oz of beer, 5 oz of wine, or 1 oz of hard liquor.  Maintain a healthy weight or lose weight if you are overweight. If you want to lose weight, talk with your health care provider. It is important that you do not lose weight too quickly.  Start or maintain an exercise program as told by your health care provider.  Drink enough fluids to keep your  urine clear or pale yellow.  Take over-the-counter and prescription medicines only as told by your health care provider.  Keep all follow-up visits as told by your health care provider. This is important. Contact a health care provider if:  You have another gout attack.  You continue to have symptoms of a gout attack after10 days of treatment.  You have side effects from your medicines.  You have chills or a fever.  You have burning pain when you urinate.  You have pain in your lower back or belly. Get help right away if:  You have severe or uncontrolled pain.  You cannot urinate. This information is not intended to replace advice given to you by your health care provider. Make sure you discuss any questions you have with your health care provider. Document Released: 08/08/2000 Document Revised: 01/17/2016 Document Reviewed: 05/24/2015 Elsevier Interactive Patient Education  [employer].   If you have lab work done today you will be contacted with your lab results within the next 2 weeks.  If you have not heard from [place] then please contact us. The fastest way to get your results is to register for My Chart.   IF you received an x-ray today, you will receive an invoice from [facility]. Please contact [facility] at [phone] with questions or concerns regarding your invoice.   IF you received labwork today, you will receive an invoice from  [facility]. Please contact [facility] at [phone] with questions or concerns regarding your invoice.   Our billing staff will not be able to assist you with questions regarding bills from these companies.  You will be contacted with the lab results as soon as they are available. The fastest way to get your results is to activate your My Chart account. Instructions are located on the last page of this paperwork. If you have not heard from [place] regarding the results in 2 weeks, please contact this office.      I  personally performed the services described in this documentation, which was scribed in my presence. The recorded information has been reviewed and considered for accuracy and completeness, addended by me as needed, and agree with information above.  Signed,   Merri Ray, MD [facility] [place].  06/15/18 11:50 AM

## 2018-06-14 NOTE — Telephone Encounter (Signed)
Please review for refill for indocin 50 mg cap.   Was previously prescribed on 05/17/18 for 5 days.  Pt has an appointment schedule for today with Dr. Carlota Raspberry.  Attempted to call patient, no answer today.  Please review with her.

## 2018-07-14 DIAGNOSIS — G4733 Obstructive sleep apnea (adult) (pediatric): Secondary | ICD-10-CM | POA: Diagnosis not present

## 2018-09-29 ENCOUNTER — Ambulatory Visit (INDEPENDENT_AMBULATORY_CARE_PROVIDER_SITE_OTHER): Payer: Medicare HMO

## 2018-09-29 ENCOUNTER — Encounter: Payer: Self-pay | Admitting: Physician Assistant

## 2018-09-29 ENCOUNTER — Ambulatory Visit (INDEPENDENT_AMBULATORY_CARE_PROVIDER_SITE_OTHER): Payer: Medicare HMO | Admitting: Physician Assistant

## 2018-09-29 ENCOUNTER — Other Ambulatory Visit: Payer: Self-pay

## 2018-09-29 VITALS — BP 147/83 | HR 72 | Temp 99.0°F | Resp 20 | Ht 61.1 in | Wt 239.4 lb

## 2018-09-29 DIAGNOSIS — J45909 Unspecified asthma, uncomplicated: Secondary | ICD-10-CM | POA: Diagnosis not present

## 2018-09-29 DIAGNOSIS — R059 Cough, unspecified: Secondary | ICD-10-CM

## 2018-09-29 DIAGNOSIS — Z6841 Body Mass Index (BMI) 40.0 and over, adult: Secondary | ICD-10-CM | POA: Diagnosis not present

## 2018-09-29 DIAGNOSIS — R05 Cough: Secondary | ICD-10-CM

## 2018-09-29 MED ORDER — PREDNISONE 20 MG PO TABS: 40.0000 mg | ORAL_TABLET | Freq: Every day | ORAL | 0 refills | Status: AC

## 2018-09-29 MED ORDER — ALBUTEROL SULFATE HFA 108 (90 BASE) MCG/ACT IN AERS: 2.0000 | INHALATION_SPRAY | RESPIRATORY_TRACT | 1 refills | Status: DC | PRN

## 2018-09-29 MED ORDER — BENZONATATE 100 MG PO CAPS: 100.0000 mg | ORAL_CAPSULE | Freq: Three times a day (TID) | ORAL | 0 refills | Status: DC | PRN

## 2018-09-29 NOTE — Progress Notes (Signed)
Miranda [name]  MRN: [id number] DOB: 07-07-1946  PCP: Horald Pollen, MD  Subjective:  Pt is a 73 year old female who presents to clinic for cough x 2-3 weeks. Cough has been on and off.  Denies fever, sinus pain, sore throat, ear pain, dizziness, chronic headache, blurred vision, chest pain, shortness of breath, heart racing, palpitations, nausea, vomiting, abdominal pain, hematuria, lower leg swelling.   Pt  has a past medical history of Allergy, Arthritis, and Hypertension.  Review of Systems  Constitutional: Negative for chills, diaphoresis, fatigue and fever.  HENT: Negative for congestion, postnasal drip and rhinorrhea.   Respiratory: Positive for cough. Negative for chest tightness, shortness of breath and wheezing.   Cardiovascular: Negative for chest pain and palpitations.    Patient Active Problem List   Diagnosis Date Noted  . Environmental allergies 03/27/2018  . Cough 03/27/2018  . Acute gout involving toe of right foot 12/07/2017  . Sinus congestion 06/03/2017  . Sinus pressure 06/03/2017  . Acute non-recurrent maxillary sinusitis 06/03/2017  . Sinus headache 06/03/2017  . BMI 45.0-49.9, adult ([place]) 03/14/2016  . Pre-diabetes 02/28/2015  . Allergic rhinitis 12/16/2014  . Snoring 12/16/2014  . HTN (hypertension) 01/17/2012    Current Outpatient Medications on File Prior to Visit  Medication Sig Dispense Refill  . atenolol-chlorthalidone (TENORETIC) 50-25 MG tablet Take 1 tablet by mouth daily. 90 tablet 3  . lisinopril (PRINIVIL,ZESTRIL) 40 MG tablet One daily 90 tablet 3  . albuterol (PROVENTIL HFA;VENTOLIN HFA) 108 (90 Base) MCG/ACT inhaler Inhale 2 puffs into the lungs every 4 (four) hours as needed for wheezing or shortness of breath (cough, shortness of breath or wheezing.). (Patient not taking: Reported on 09/29/2018) 1 Inhaler 1  . indomethacin (INDOCIN) 50 MG capsule Take 1 capsule (50 mg total) by mouth 3 (three) times daily as needed (with food.).  (Patient not taking: Reported on 09/29/2018) 30 capsule 0   No current facility-administered medications on file prior to visit.     No Known Allergies   Objective:  BP (!) 147/83   Pulse 72   Temp 99 F (37.2 C) (Oral)   Resp 20   Ht 5' 1.1" (1.552 m)   Wt 239 lb 6.4 oz (108.6 kg)   SpO2 98%   BMI 45.08 kg/m   Physical Exam Vitals signs reviewed.  Constitutional:      General: She is not in acute distress. Cardiovascular:     Rate and Rhythm: Normal rate and regular rhythm.     Heart sounds: Normal heart sounds.  Pulmonary:     Effort: Pulmonary effort is normal.     Breath sounds: Normal breath sounds. No wheezing, rhonchi or rales.  Skin:    General: Skin is warm and dry.  Neurological:     Mental Status: She is alert and oriented to person, place, and time.  Psychiatric:        Judgment: Judgment normal.    Dg Chest 2 View  Result Date: 09/29/2018 CLINICAL DATA:  Cough for 3 weeks EXAM: CHEST - 2 VIEW COMPARISON:  None. FINDINGS: The heart size and mediastinal contours are within normal limits. Both lungs are clear. The visualized skeletal structures are unremarkable. IMPRESSION: No active cardiopulmonary disease. Electronically Signed   By: Dorise Bullion III M.D   On: 09/29/2018 10:10   Assessment and Plan :  1. Cough - Pt c/o cough 2-3 weeks. Vitals and x-ray are reassuring. NAD. Plan to treat supportively. RTC in 5-7 days  if no improvement.  - DG Chest 2 View; Future - albuterol (PROVENTIL HFA;VENTOLIN HFA) 108 (90 Base) MCG/ACT inhaler; Inhale 2 puffs into the lungs every 4 (four) hours as needed for wheezing or shortness of breath (cough, shortness of breath or wheezing.).  Dispense: 1 Inhaler; Refill: 1 - predniSONE (DELTASONE) 20 MG tablet; Take 2 tablets (40 mg total) by mouth daily with breakfast for 5 days.  Dispense: 10 tablet; Refill: 0 - benzonatate (TESSALON) 100 MG capsule; Take 1-2 capsules (100-200 mg total) by mouth 3 (three) times daily as needed  for cough.  Dispense: 40 capsule; Refill: 0  2. Mild reactive airways disease - albuterol (PROVENTIL HFA;VENTOLIN HFA) 108 (90 Base) MCG/ACT inhaler; Inhale 2 puffs into the lungs every 4 (four) hours as needed for wheezing or shortness of breath (cough, shortness of breath or wheezing.).  Dispense: 1 Inhaler; Refill: 1  Whitney Haroon Shatto, PA-C  [facility] [place] 09/29/2018 9:32 AM  Please note: Portions of this report may have been transcribed using dragon voice recognition software. Every effort was made to ensure accuracy; however, inadvertent computerized transcription errors may be present.

## 2018-09-29 NOTE — Patient Instructions (Signed)
  Tessalon is for cough during the day. This should not make you drowsy.  Prednisone. Start a 5-day course of Prednisone if you are still feeling bad while taking Tessalon.    Stay well hydrated. Get lost of rest. Wash your hands often.    try using a honey-based tea. Use 3 teaspoons of honey with juice squeezed from half lemon. Place shaved pieces of ginger into 1/2-1 cup of water and warm over stove top. Then mix the ingredients and repeat every 4 hours as needed.  Cough Syrup Recipe: Sweet Lemon & Honey Thyme  Ingredients . a handful of fresh thyme sprigs   . 1 pint of water (2 cups)  . 1/2 cup honey (raw is best, but regular will do)  . 1/2 lemon chopped Instructions 1. Place the lemon in the pint jar and cover with the honey. The honey will macerate the lemons and draw out liquids which taste so delicious! 2. Meanwhile, toss the thyme leaves into a saucepan and cover them with the water. 3. Bring the water to a gentle simmer and reduce it to half, about a cup of tea. 4. When the tea is reduced and cooled a bit, strain the sprigs & leaves, add it into the pint jar and stir it well. 5. Give it a shake and use a spoonful as needed. 6. Store your homemade cough syrup in the refrigerator for about a month.  Is there anything I can do on my own to get rid of my cough?  Yes. To help get rid of your cough, you can: ?Use a humidifier in your bedroom ?Use an over-the-counter cough medicine, or suck on cough drops or hard candy ?Stop smoking, if you smoke ?If you have allergies, avoid the things you are allergic to (like pollen, dust, animals, or mold) If you have acid reflux, your doctor or nurse will tell you which lifestyle changes can help reduce symptoms.

## 2018-11-17 DIAGNOSIS — G4733 Obstructive sleep apnea (adult) (pediatric): Secondary | ICD-10-CM | POA: Diagnosis not present

## 2019-01-11 ENCOUNTER — Telehealth: Payer: Self-pay | Admitting: *Deleted

## 2019-01-11 ENCOUNTER — Telehealth (INDEPENDENT_AMBULATORY_CARE_PROVIDER_SITE_OTHER): Payer: Medicare HMO | Admitting: Emergency Medicine

## 2019-01-11 ENCOUNTER — Encounter: Payer: Self-pay | Admitting: Emergency Medicine

## 2019-01-11 DIAGNOSIS — M109 Gout, unspecified: Secondary | ICD-10-CM

## 2019-01-11 DIAGNOSIS — M79675 Pain in left toe(s): Secondary | ICD-10-CM

## 2019-01-11 MED ORDER — PREDNISONE 20 MG PO TABS: 40.0000 mg | ORAL_TABLET | Freq: Every day | ORAL | 0 refills | Status: AC

## 2019-01-11 MED ORDER — TRAMADOL HCL 50 MG PO TABS: 50.0000 mg | ORAL_TABLET | Freq: Three times a day (TID) | ORAL | 0 refills | Status: AC | PRN

## 2019-01-11 NOTE — Telephone Encounter (Signed)
Called patient for second time to triage for appointment. Left message in voice mail.

## 2019-01-11 NOTE — Telephone Encounter (Signed)
Called patient to triage for appointment.  Left message in mobile voice mail to call back. 

## 2019-01-11 NOTE — Progress Notes (Signed)
Telemedicine Encounter- SOAP NOTE Established Patient  This telephone encounter was conducted with the patient's (or proxy's) verbal consent via audio telecommunications: yes/no: Yes Patient was instructed to have this encounter in a suitably private space; and to only have persons present to whom they give permission to participate. In addition, patient identity was confirmed by use of name plus two identifiers (DOB and address).  I discussed the limitations, risks, security and privacy concerns of performing an evaluation and management service by telephone and the availability of in person appointments. I also discussed with the patient that there may be a patient responsible charge related to this service. The patient expressed understanding and agreed to proceed.  I spent a total of TIME; 0 MIN TO 60 MIN: 15 minutes talking with the patient or their proxy.  No chief complaint on file. Pain to left big toe  Subjective   Miranda [name] is a 73 y.o. female established patient. Telephone visit today complaining of pain to her left great toe that started 2 to 3 days ago.  Denies injury.  Has a history of gout and feels typical of a gout flare.  No other complaints or medical concerns today.  HPI   Patient Active Problem List   Diagnosis Date Noted  . Environmental allergies 03/27/2018  . Acute gout involving toe of right foot 12/07/2017  . BMI 45.0-49.9, adult ([place]) 03/14/2016  . Pre-diabetes 02/28/2015  . HTN (hypertension) 01/17/2012    Past Medical History:  Diagnosis Date  . Allergy   . Arthritis   . Hypertension     Current Outpatient Medications  Medication Sig Dispense Refill  . albuterol (PROVENTIL HFA;VENTOLIN HFA) 108 (90 Base) MCG/ACT inhaler Inhale 2 puffs into the lungs every 4 (four) hours as needed for wheezing or shortness of breath (cough, shortness of breath or wheezing.). 1 Inhaler 1  . atenolol-chlorthalidone (TENORETIC) 50-25 MG tablet Take 1 tablet by  mouth daily. 90 tablet 3  . benzonatate (TESSALON) 100 MG capsule Take 1-2 capsules (100-200 mg total) by mouth 3 (three) times daily as needed for cough. 40 capsule 0  . lisinopril (PRINIVIL,ZESTRIL) 40 MG tablet One daily 90 tablet 3  . predniSONE (DELTASONE) 20 MG tablet Take 2 tablets (40 mg total) by mouth daily with breakfast for 5 days. 10 tablet 0  . traMADol (ULTRAM) 50 MG tablet Take 1 tablet (50 mg total) by mouth every 8 (eight) hours as needed for up to 5 days. 15 tablet 0   No current facility-administered medications for this visit.     No Known Allergies  Social History   Socioeconomic History  . Marital status: Married    Spouse name: Sheppard Coil  . Number of children: 2  . Years of education: HS  . Highest education level: Not on file  Occupational History  . Occupation: Deli Counter    Comment: [employer] [employer]  Social Needs  . Financial resource strain: Not on file  . Food insecurity:    Worry: Not on file    Inability: Not on file  . Transportation needs:    Medical: Not on file    Non-medical: Not on file  Tobacco Use  . Smoking status: Never Smoker  . Smokeless tobacco: Never Used  Substance and Sexual Activity  . Alcohol use: Yes    Alcohol/week: 0.0 standard drinks    Comment: very seldom  . Drug use: No  . Sexual activity: Yes  Lifestyle  . Physical activity:  Days per week: Not on file    Minutes per session: Not on file  . Stress: Not on file  Relationships  . Social connections:    Talks on phone: Not on file    Gets together: Not on file    Attends religious service: Not on file    Active member of club or organization: Not on file    Attends meetings of clubs or organizations: Not on file    Relationship status: Not on file  . Intimate partner violence:    Fear of current or ex partner: Not on file    Emotionally abused: Not on file    Physically abused: Not on file    Forced sexual activity: Not on file  Other Topics Concern  . Not  on file  Social History Narrative   1-2 sodas a day, daily consumption of tea   Lives with her husband and and their youngest daughter.    Review of Systems  Constitutional: Negative.  Negative for chills and fever.  HENT: Negative.  Negative for congestion and sore throat.   Eyes: Negative.  Negative for blurred vision and double vision.  Respiratory: Negative.  Negative for cough and shortness of breath.   Cardiovascular: Negative.  Negative for chest pain and palpitations.  Gastrointestinal: Negative.  Negative for abdominal pain, diarrhea, nausea and vomiting.  Genitourinary: Negative.  Negative for dysuria.  Musculoskeletal: Negative for myalgias.  Skin: Negative.   Neurological: Negative for dizziness and headaches.  All other systems reviewed and are negative.   Objective   Vitals as reported by the patient:None available There were no vitals filed for this visit. Awake and oriented x3 in no apparent respiratory distress Diagnoses and all orders for this visit:  Acute gouty arthritis -     predniSONE (DELTASONE) 20 MG tablet; Take 2 tablets (40 mg total) by mouth daily with breakfast for 5 days. -     traMADol (ULTRAM) 50 MG tablet; Take 1 tablet (50 mg total) by mouth every 8 (eight) hours as needed for up to 5 days.  Great toe pain, left -     predniSONE (DELTASONE) 20 MG tablet; Take 2 tablets (40 mg total) by mouth daily with breakfast for 5 days. -     traMADol (ULTRAM) 50 MG tablet; Take 1 tablet (50 mg total) by mouth every 8 (eight) hours as needed for up to 5 days.     I discussed the assessment and treatment plan with the patient. The patient was provided an opportunity to ask questions and all were answered. The patient agreed with the plan and demonstrated an understanding of the instructions.   The patient was advised to call back or seek an in-person evaluation if the symptoms worsen or if the condition fails to improve as anticipated.  I provided 15  minutes of non-face-to-face time during this encounter.  Horald Pollen, MD  Primary Care at [facility]

## 2019-03-14 ENCOUNTER — Ambulatory Visit: Payer: Self-pay | Admitting: Emergency Medicine

## 2019-03-14 NOTE — Telephone Encounter (Signed)
Pt called in co right foot being slightly swollen and painful above her big toe.   No injuries.    See triage notes below.  I forwarded a note to Primary Care at [facility] for Dr Mitchel Honour so someone could contact her for scheduling once the office opens this morning.   She was agreeable to this plan.  Notes sent to the office.  Reason for Disposition . Pain in the big toe joint    Pain above big toe on right foot.   Has gout but this does not feel like her gout.  Answer Assessment - Initial Assessment Questions 1. ONSET: "When did the pain start?"      Right foot a little swollen and painful 2. LOCATION: "Where is the pain located?"      Right foot pain above my big toe.    I have gout but it's not that. 3. PAIN: "How bad is the pain?"    (Scale 1-10; or mild, moderate, severe)   -  MILD (1-3): doesn't interfere with normal activities    -  MODERATE (4-7): interferes with normal activities (e.g., work or school) or awakens from sleep, limping    -  SEVERE (8-10): excruciating pain, unable to do any normal activities, unable to walk     Friday night it started.    I took 3 Advil and the pain when away. 4. WORK OR EXERCISE: "Has there been any recent work or exercise that involved this part of the body?"      I got bit by something at work on my wrist.   I don't know what it is.   It swelled up and itching.   I put antiseptic on it and it helped a lot. 5. CAUSE: "What do you think is causing the foot pain?"     I have no idea. 6. OTHER SYMPTOMS: "Do you have any other symptoms?" (e.g., leg pain, rash, fever, numbness)     No numbness or tingling.   It just hurts above my big toe.   I can walk.   7. PREGNANCY: "Is there any chance you are pregnant?" "When was your last menstrual period?"     Not asked due to age.  Protocols used: FOOT PAIN-A-AH

## 2019-04-01 DIAGNOSIS — G4733 Obstructive sleep apnea (adult) (pediatric): Secondary | ICD-10-CM | POA: Diagnosis not present

## 2019-04-22 ENCOUNTER — Other Ambulatory Visit: Payer: Self-pay | Admitting: Emergency Medicine

## 2019-04-22 DIAGNOSIS — M79675 Pain in left toe(s): Secondary | ICD-10-CM

## 2019-04-22 DIAGNOSIS — M109 Gout, unspecified: Secondary | ICD-10-CM

## 2019-04-22 NOTE — Telephone Encounter (Signed)
Medication not on current medication list.   Future visit scheduled?  No  Notes to clinic   Requested Prescriptions  Pending Prescriptions Disp Refills   traMADol (ULTRAM) 50 MG tablet [Pharmacy Med Name: TRAMADOL 50MG  TABLETS] 15 tablet     Sig: TAKE 1 TABLET(50 MG) BY MOUTH EVERY 8 HOURS FOR UP TO 5 DAYS AS NEEDED     Not Delegated - Analgesics:  Opioid Agonists Failed - 04/22/2019 11:59 AM      Failed - This refill cannot be delegated      Failed - Urine Drug Screen completed in last 360 days.      Failed - Valid encounter within last 6 months    Recent Outpatient Visits          6 months ago Cough   Primary Care at [facility], Gelene Mink, PA-C   10 months ago Acute gout of left foot, unspecified cause   Primary Care at [place], [place]   11 months ago Pain of left great toe   Primary Care at [facility], Ines Bloomer, MD   1 year ago Cough   Primary Care at [place], [place]   1 year ago Acute gout involving toe of right foot, unspecified cause   Primary Care at [facility], Gelene Mink, [place]

## 2019-04-25 ENCOUNTER — Encounter: Payer: Self-pay | Admitting: Emergency Medicine

## 2019-04-25 ENCOUNTER — Telehealth (INDEPENDENT_AMBULATORY_CARE_PROVIDER_SITE_OTHER): Payer: Medicare HMO | Admitting: Emergency Medicine

## 2019-04-25 DIAGNOSIS — I10 Essential (primary) hypertension: Secondary | ICD-10-CM

## 2019-04-25 MED ORDER — ATENOLOL-CHLORTHALIDONE 50-25 MG PO TABS: 1.0000 | ORAL_TABLET | Freq: Every day | ORAL | 3 refills | Status: DC

## 2019-04-25 MED ORDER — LISINOPRIL 40 MG PO TABS: ORAL_TABLET | ORAL | 3 refills | Status: DC

## 2019-04-25 NOTE — Progress Notes (Signed)
Called patient to triage for appointment. Patient states she need her medication refilled. Patient states no other problems.

## 2019-04-25 NOTE — Progress Notes (Signed)
Telemedicine Encounter- SOAP NOTE Established Patient  This telephone encounter was conducted with the patient's (or proxy's) verbal consent via audio telecommunications: yes/no: Yes Patient was instructed to have this encounter in a suitably private space; and to only have persons present to whom they give permission to participate. In addition, patient identity was confirmed by use of name plus two identifiers (DOB and address).  I discussed the limitations, risks, security and privacy concerns of performing an evaluation and management service by telephone and the availability of in person appointments. I also discussed with the patient that there may be a patient responsible charge related to this service. The patient expressed understanding and agreed to proceed.  I spent a total of TIME; 0 MIN TO 60 MIN: 15 minutes talking with the patient or their proxy.  No chief complaint on file.  Hypertension follow-up and medication refills Subjective   Miranda [name] is a 73 y.o. female established patient. Telephone visit today for follow-up on hypertension and medication refills.  Doing well.  Run out of medication 2 days ago but states blood pressure is still within normal limits.  Asymptomatic.  Has no complaints or medical concerns today.  HPI   Patient Active Problem List   Diagnosis Date Noted  . Environmental allergies 03/27/2018  . BMI 45.0-49.9, adult ([place]) 03/14/2016  . Pre-diabetes 02/28/2015  . HTN (hypertension) 01/17/2012    Past Medical History:  Diagnosis Date  . Allergy   . Arthritis   . Hypertension     Current Outpatient Medications  Medication Sig Dispense Refill  . albuterol (PROVENTIL HFA;VENTOLIN HFA) 108 (90 Base) MCG/ACT inhaler Inhale 2 puffs into the lungs every 4 (four) hours as needed for wheezing or shortness of breath (cough, shortness of breath or wheezing.). 1 Inhaler 1  . atenolol-chlorthalidone (TENORETIC) 50-25 MG tablet Take 1 tablet by mouth  daily. 90 tablet 3  . lisinopril (PRINIVIL,ZESTRIL) 40 MG tablet One daily 90 tablet 3   No current facility-administered medications for this visit.     No Known Allergies  Social History   Socioeconomic History  . Marital status: Married    Spouse name: Sheppard Coil  . Number of children: 2  . Years of education: HS  . Highest education level: Not on file  Occupational History  . Occupation: Deli Counter    Comment: [employer]  Social Needs  . Financial resource strain: Not on file  . Food insecurity    Worry: Not on file    Inability: Not on file  . Transportation needs    Medical: Not on file    Non-medical: Not on file  Tobacco Use  . Smoking status: Never Smoker  . Smokeless tobacco: Never Used  Substance and Sexual Activity  . Alcohol use: Yes    Alcohol/week: 0.0 standard drinks    Comment: very seldom  . Drug use: No  . Sexual activity: Yes  Lifestyle  . Physical activity    Days per week: Not on file    Minutes per session: Not on file  . Stress: Not on file  Relationships  . Social Herbalist on phone: Not on file    Gets together: Not on file    Attends religious service: Not on file    Active member of club or organization: Not on file    Attends meetings of clubs or organizations: Not on file    Relationship status: Not on file  . Intimate partner violence  Fear of current or ex partner: Not on file    Emotionally abused: Not on file    Physically abused: Not on file    Forced sexual activity: Not on file  Other Topics Concern  . Not on file  Social History Narrative   1-2 sodas a day, daily consumption of tea   Lives with her husband and and their youngest daughter.    Review of Systems  Constitutional: Negative.  Negative for chills and fever.  HENT: Negative.  Negative for congestion and sore throat.   Eyes: Negative for blurred vision and double vision.  Respiratory: Negative.  Negative for cough and shortness of breath.    Cardiovascular: Negative.  Negative for chest pain and palpitations.  Gastrointestinal: Negative for abdominal pain, diarrhea, nausea and vomiting.  Musculoskeletal: Negative for myalgias.  Skin: Negative.  Negative for rash.  Neurological: Negative.  Negative for dizziness and headaches.  All other systems reviewed and are negative.   Objective  Alert and oriented x3 in no apparent respiratory distress Vitals as reported by the patient: Today's Vitals   04/25/19 1638  Weight: 235 lb (106.6 kg)  Height: 5' 0.75" (1.543 m)    Diagnoses and all orders for this visit:  Essential hypertension -     atenolol-chlorthalidone (TENORETIC) 50-25 MG tablet; Take 1 tablet by mouth daily. -     lisinopril (ZESTRIL) 40 MG tablet; One daily    Clinically stable.  No medical concerns identified during this visit. Continue present medications.  No changes. Office visit in 3 months.  I discussed the assessment and treatment plan with the patient. The patient was provided an opportunity to ask questions and all were answered. The patient agreed with the plan and demonstrated an understanding of the instructions.   The patient was advised to call back or seek an in-person evaluation if the symptoms worsen or if the condition fails to improve as anticipated.  I provided 15 minutes of non-face-to-face time during this encounter.  Horald Pollen, MD  Primary Care at [facility]

## 2019-05-26 DIAGNOSIS — Z1231 Encounter for screening mammogram for malignant neoplasm of breast: Secondary | ICD-10-CM | POA: Diagnosis not present

## 2019-05-26 LAB — HM MAMMOGRAPHY

## 2019-06-01 ENCOUNTER — Encounter: Payer: Self-pay | Admitting: *Deleted

## 2019-06-27 ENCOUNTER — Encounter: Payer: Self-pay | Admitting: Emergency Medicine

## 2019-06-27 ENCOUNTER — Telehealth (INDEPENDENT_AMBULATORY_CARE_PROVIDER_SITE_OTHER): Payer: Medicare HMO | Admitting: Emergency Medicine

## 2019-06-27 ENCOUNTER — Other Ambulatory Visit: Payer: Self-pay

## 2019-06-27 VITALS — Ht 61.0 in | Wt 142.0 lb

## 2019-06-27 DIAGNOSIS — Z13820 Encounter for screening for osteoporosis: Secondary | ICD-10-CM | POA: Diagnosis not present

## 2019-06-27 DIAGNOSIS — J4520 Mild intermittent asthma, uncomplicated: Secondary | ICD-10-CM | POA: Diagnosis not present

## 2019-06-27 DIAGNOSIS — R42 Dizziness and giddiness: Secondary | ICD-10-CM

## 2019-06-27 MED ORDER — ALBUTEROL SULFATE HFA 108 (90 BASE) MCG/ACT IN AERS: 2.0000 | INHALATION_SPRAY | RESPIRATORY_TRACT | 3 refills | Status: DC | PRN

## 2019-06-27 MED ORDER — MECLIZINE HCL 25 MG PO TABS: 25.0000 mg | ORAL_TABLET | Freq: Three times a day (TID) | ORAL | 0 refills | Status: DC | PRN

## 2019-06-27 NOTE — Progress Notes (Signed)
Called patient to triage for appointment. Patient states yesterday morning when she got up the bed felt like it was spinning. Patient denies nausea or vomiting. Patient's blood pressure this morning was 127/88.

## 2019-06-27 NOTE — Progress Notes (Signed)
Telemedicine Encounter- SOAP NOTE Established Patient  This telephone encounter was conducted with the patient's (or proxy's) verbal consent via audio telecommunications: yes/no: Yes Patient was instructed to have this encounter in a suitably private space; and to only have persons present to whom they give permission to participate. In addition, patient identity was confirmed by use of name plus two identifiers (DOB and address).  I discussed the limitations, risks, security and privacy concerns of performing an evaluation and management service by telephone and the availability of in person appointments. I also discussed with the patient that there may be a patient responsible charge related to this service. The patient expressed understanding and agreed to proceed.  I spent a total of TIME; 0 MIN TO 60 MIN: 15 minutes talking with the patient or their proxy.  No chief complaint on file.   Subjective   Miranda [name] is a 73 y.o. female established patient. Telephone visit today complaining of vertigo that started yesterday morning.  Patient has a history of vertigo and this episode is behaving similarly.  No new atypical features.  States "vertigo is acting up".  Woke up with symptoms yesterday.  Worse when turning head or looking down.  Denies nausea or vomiting.  Denies visual problems.  Denies slurred speech or facial droop.  Denies localized extremity weakness.  Able to walk okay.  Denies fever or chills or flulike symptoms.  Denies any other associated symptoms.  HPI   Patient Active Problem List   Diagnosis Date Noted  . Environmental allergies 03/27/2018  . BMI 45.0-49.9, adult ([place]) 03/14/2016  . Pre-diabetes 02/28/2015  . HTN (hypertension) 01/17/2012    Past Medical History:  Diagnosis Date  . Allergy   . Arthritis   . Hypertension     Current Outpatient Medications  Medication Sig Dispense Refill  . albuterol (VENTOLIN HFA) 108 (90 Base) MCG/ACT inhaler Inhale 2  puffs into the lungs every 4 (four) hours as needed for wheezing or shortness of breath (cough, shortness of breath or wheezing.). 18 g 3  . atenolol-chlorthalidone (TENORETIC) 50-25 MG tablet Take 1 tablet by mouth daily. 90 tablet 3  . lisinopril (ZESTRIL) 40 MG tablet One daily 90 tablet 3  . meclizine (ANTIVERT) 25 MG tablet Take 1 tablet (25 mg total) by mouth 3 (three) times daily as needed for dizziness. 30 tablet 0   No current facility-administered medications for this visit.     No Known Allergies  Social History   Socioeconomic History  . Marital status: Married    Spouse name: Sheppard Coil  . Number of children: 2  . Years of education: HS  . Highest education level: Not on file  Occupational History  . Occupation: Deli Counter    Comment: [employer]  Social Needs  . Financial resource strain: Not on file  . Food insecurity    Worry: Not on file    Inability: Not on file  . Transportation needs    Medical: Not on file    Non-medical: Not on file  Tobacco Use  . Smoking status: Never Smoker  . Smokeless tobacco: Never Used  Substance and Sexual Activity  . Alcohol use: Yes    Alcohol/week: 0.0 standard drinks    Comment: very seldom  . Drug use: No  . Sexual activity: Yes  Lifestyle  . Physical activity    Days per week: Not on file    Minutes per session: Not on file  . Stress: Not on file  Relationships  . Social Herbalist on phone: Not on file    Gets together: Not on file    Attends religious service: Not on file    Active member of club or organization: Not on file    Attends meetings of clubs or organizations: Not on file    Relationship status: Not on file  . Intimate partner violence    Fear of current or ex partner: Not on file    Emotionally abused: Not on file    Physically abused: Not on file    Forced sexual activity: Not on file  Other Topics Concern  . Not on file  Social History Narrative   1-2 sodas a day, daily  consumption of tea   Lives with her husband and and their youngest daughter.    Review of Systems  Constitutional: Negative.  Negative for chills and fever.  HENT: Negative.  Negative for congestion and sore throat.   Eyes: Negative.  Negative for blurred vision and double vision.  Respiratory: Negative.  Negative for cough and shortness of breath.   Cardiovascular: Negative.  Negative for chest pain and palpitations.  Gastrointestinal: Negative.  Negative for abdominal pain, blood in stool, melena, nausea and vomiting.  Genitourinary: Negative.  Negative for dysuria and hematuria.  Musculoskeletal: Negative.  Negative for back pain, myalgias and neck pain.  Skin: Negative.  Negative for rash.  Neurological: Negative.  Negative for dizziness and headaches.  All other systems reviewed and are negative.   Objective  Alert and oriented x3 in no respiratory distress. Vitals as reported by the patient: Today's Vitals   06/27/19 0931  Weight: 142 lb (64.4 kg)  Height: 5\' 1"  (1.549 m)    Diagnoses and all orders for this visit:  Vertigo -     meclizine (ANTIVERT) 25 MG tablet; Take 1 tablet (25 mg total) by mouth 3 (three) times daily as needed for dizziness.  Mild reactive airways disease -     albuterol (VENTOLIN HFA) 108 (90 Base) MCG/ACT inhaler; Inhale 2 puffs into the lungs every 4 (four) hours as needed for wheezing or shortness of breath (cough, shortness of breath or wheezing.).  Osteoporosis screening  Clinically stable.  No red flag signs or symptoms.  States she has taken meclizine before with good results.  ED stroke precautions given.  Advised to contact the office if no better in the next 48 hours.   I discussed the assessment and treatment plan with the patient. The patient was provided an opportunity to ask questions and all were answered. The patient agreed with the plan and demonstrated an understanding of the instructions.   The patient was advised to call back  or seek an in-person evaluation if the symptoms worsen or if the condition fails to improve as anticipated.  I provided 15 minutes of non-face-to-face time during this encounter.  Horald Pollen, MD  Primary Care at [facility]

## 2019-07-18 ENCOUNTER — Telehealth: Payer: Self-pay | Admitting: *Deleted

## 2019-07-18 NOTE — Telephone Encounter (Signed)
Schedule AWV.  

## 2019-07-28 ENCOUNTER — Telehealth: Payer: Self-pay | Admitting: *Deleted

## 2019-07-28 NOTE — Telephone Encounter (Signed)
AWV schedule

## 2019-11-30 DIAGNOSIS — G4733 Obstructive sleep apnea (adult) (pediatric): Secondary | ICD-10-CM | POA: Diagnosis not present

## 2019-12-20 ENCOUNTER — Telehealth: Payer: Self-pay | Admitting: *Deleted

## 2020-01-09 ENCOUNTER — Other Ambulatory Visit: Payer: Self-pay

## 2020-01-09 ENCOUNTER — Encounter: Payer: Self-pay | Admitting: Emergency Medicine

## 2020-01-09 ENCOUNTER — Ambulatory Visit (INDEPENDENT_AMBULATORY_CARE_PROVIDER_SITE_OTHER): Payer: Medicare HMO | Admitting: Emergency Medicine

## 2020-01-09 VITALS — BP 143/82 | HR 59 | Temp 97.8°F | Resp 16 | Ht 61.0 in | Wt 243.0 lb

## 2020-01-09 DIAGNOSIS — I10 Essential (primary) hypertension: Secondary | ICD-10-CM

## 2020-01-09 DIAGNOSIS — Z8739 Personal history of other diseases of the musculoskeletal system and connective tissue: Secondary | ICD-10-CM | POA: Diagnosis not present

## 2020-01-09 DIAGNOSIS — M25471 Effusion, right ankle: Secondary | ICD-10-CM

## 2020-01-09 DIAGNOSIS — R42 Dizziness and giddiness: Secondary | ICD-10-CM

## 2020-01-09 DIAGNOSIS — J4520 Mild intermittent asthma, uncomplicated: Secondary | ICD-10-CM | POA: Diagnosis not present

## 2020-01-09 MED ORDER — MECLIZINE HCL 25 MG PO TABS: 25.0000 mg | ORAL_TABLET | Freq: Three times a day (TID) | ORAL | 0 refills | Status: DC | PRN

## 2020-01-09 MED ORDER — ALBUTEROL SULFATE HFA 108 (90 BASE) MCG/ACT IN AERS: 2.0000 | INHALATION_SPRAY | RESPIRATORY_TRACT | 3 refills | Status: DC | PRN

## 2020-01-09 NOTE — Patient Instructions (Addendum)
If you have lab work done today you will be contacted with your lab results within the next 2 weeks.  If you have not heard from [place] then please contact us. The fastest way to get your results is to register for My Chart.   IF you received an x-ray today, you will receive an invoice from [facility]. Please contact [facility] at [phone] with questions or concerns regarding your invoice.   IF you received labwork today, you will receive an invoice from [place]. Please contact [facility] at [phone] with questions or concerns regarding your invoice.   Our billing staff will not be able to assist you with questions regarding bills from these companies.  You will be contacted with the lab results as soon as they are available. The fastest way to get your results is to activate your My Chart account. Instructions are located on the last page of this paperwork. If you have not heard from [place] regarding the results in 2 weeks, please contact this office.        If you have lab work done today you will be contacted with your lab results within the next 2 weeks.  If you have not heard from [place] then please contact us. The fastest way to get your results is to register for My Chart.   IF you received an x-ray today, you will receive an invoice from [facility]. Please contact [facility] at [phone] with questions or concerns regarding your invoice.   IF you received labwork today, you will receive an invoice from [place]. Please contact [facility] at [phone] with questions or concerns regarding your invoice.   Our billing staff will not be able to assist you with questions regarding bills from these companies.  You will be contacted with the lab results as soon as they are available. The fastest way to get your results is to activate your My Chart account. Instructions are located on the last page of this paperwork. If you have not heard from [place]  regarding the results in 2 weeks, please contact this office.     Health Maintenance After Age 77 After age 4, you are at a higher risk for certain long-term diseases and infections as well as injuries from falls. Falls are a major cause of broken bones and head injuries in people who are older than age 79. Getting regular preventive care can help to keep you healthy and well. Preventive care includes getting regular testing and making lifestyle changes as recommended by your health care provider. Talk with your health care provider about:  Which screenings and tests you should have. A screening is a test that checks for a disease when you have no symptoms.  A diet and exercise plan that is right for you. What should I know about screenings and tests to prevent falls? Screening and testing are the best ways to find a health problem early. Early diagnosis and treatment give you the best chance of managing medical conditions that are common after age 39. Certain conditions and lifestyle choices may make you more likely to have a fall. Your health care provider may recommend:  Regular vision checks. Poor vision and conditions such as cataracts can make you more likely to have a fall. If you wear glasses, make sure to get your prescription updated if your vision changes.  Medicine review. Work with your health care provider to regularly review all of the medicines you are taking, including over-the-counter medicines.  Ask your health care provider about any side effects that may make you more likely to have a fall. Tell your health care provider if any medicines that you take make you feel dizzy or sleepy.  Osteoporosis screening. Osteoporosis is a condition that causes the bones to get weaker. This can make the bones weak and cause them to break more easily.  Blood pressure screening. Blood pressure changes and medicines to control blood pressure can make you feel dizzy.  Strength and balance  checks. Your health care provider may recommend certain tests to check your strength and balance while standing, walking, or changing positions.  Foot health exam. Foot pain and numbness, as well as not wearing proper footwear, can make you more likely to have a fall.  Depression screening. You may be more likely to have a fall if you have a fear of falling, feel emotionally low, or feel unable to do activities that you used to do.  Alcohol use screening. Using too much alcohol can affect your balance and may make you more likely to have a fall. What actions can I take to lower my risk of falls? General instructions  Talk with your health care provider about your risks for falling. Tell your health care provider if: ? You fall. Be sure to tell your health care provider about all falls, even ones that seem minor. ? You feel dizzy, sleepy, or off-balance.  Take over-the-counter and prescription medicines only as told by your health care provider. These include any supplements.  Eat a healthy diet and maintain a healthy weight. A healthy diet includes low-fat dairy products, low-fat (lean) meats, and fiber from whole grains, beans, and lots of fruits and vegetables. Home safety  Remove any tripping hazards, such as rugs, cords, and clutter.  Install safety equipment such as grab bars in bathrooms and safety rails on stairs.  Keep rooms and walkways well-lit. Activity   Follow a regular exercise program to stay fit. This will help you maintain your balance. Ask your health care provider what types of exercise are appropriate for you.  If you need a cane or walker, use it as recommended by your health care provider.  Wear supportive shoes that have nonskid soles. Lifestyle  Do not drink alcohol if your health care provider tells you not to drink.  If you drink alcohol, limit how much you have: ? 0-1 drink a day for women. ? 0-2 drinks a day for men.  Be aware of how much alcohol is  in your drink. In the [place] one drink equals one typical bottle of beer (12 oz), one-half glass of wine (5 oz), or one shot of hard liquor (1 oz).  Do not use any products that contain nicotine or tobacco, such as cigarettes and e-cigarettes. If you need help quitting, ask your health care provider. Summary  Having a healthy lifestyle and getting preventive care can help to protect your health and wellness after age 71.  Screening and testing are the best way to find a health problem early and help you avoid having a fall. Early diagnosis and treatment give you the best chance for managing medical conditions that are more common for people who are older than age 55.  Falls are a major cause of broken bones and head injuries in people who are older than age 58. Take precautions to prevent a fall at home.  Work with your health care provider to learn what changes you can make to improve your  health and wellness and to prevent falls. This information is not intended to replace advice given to you by your health care provider. Make sure you discuss any questions you have with your health care provider. Document Revised: 12/02/2018 Document Reviewed: 06/24/2017 Elsevier Patient Education  2020 [employer].

## 2020-01-09 NOTE — Progress Notes (Signed)
Miranda [name] 74 y.o.   Chief Complaint  Patient presents with  . Foot Swelling    RIGHT with pain started 3 days (Friday)    HISTORY OF PRESENT ILLNESS: This is a 74 y.o. female complaining of pain redness and swelling of right ankle started 3 days ago and is now much better.  Denies injury.  Has a history of gout.  Also hypertensive on lisinopril and Tenoretic.  Requesting medication refill.  No other complaints or medical concerns today.  HPI   Prior to Admission medications   Medication Sig Start Date End Date Taking? Authorizing Provider  albuterol (VENTOLIN HFA) 108 (90 Base) MCG/ACT inhaler Inhale 2 puffs into the lungs every 4 (four) hours as needed for wheezing or shortness of breath (cough, shortness of breath or wheezing.). 06/27/19  Yes Miranda [name], Miranda Bloomer, MD  atenolol-chlorthalidone (TENORETIC) 50-25 MG tablet Take 1 tablet by mouth daily. 04/25/19  Yes Miranda Pollen, MD  lisinopril (ZESTRIL) 40 MG tablet One daily 04/25/19  Yes Miranda [name], Miranda Bloomer, MD  meclizine (ANTIVERT) 25 MG tablet Take 1 tablet (25 mg total) by mouth 3 (three) times daily as needed for dizziness. 06/27/19  Yes Miranda Bloomer, MD    No Known Allergies  Patient Active Problem List   Diagnosis Date Noted  . Environmental allergies 03/27/2018  . BMI 45.0-49.9, adult ([place]) 03/14/2016  . Pre-diabetes 02/28/2015  . HTN (hypertension) 01/17/2012    Past Medical History:  Diagnosis Date  . Allergy   . Arthritis   . Hypertension     Past Surgical History:  Procedure Laterality Date  . ABDOMINAL HYSTERECTOMY    . breast biopsy Left    benign  . CHOLECYSTECTOMY      Social History   Socioeconomic History  . Marital status: Married    Spouse name: Miranda [name]  . Number of children: 2  . Years of education: HS  . Highest education level: Not on file  Occupational History  . Occupation: Deli Counter    Comment: Miranda [name]  Tobacco Use  . Smoking status: Never Smoker  .  Smokeless tobacco: Never Used  Substance and Sexual Activity  . Alcohol use: Yes    Alcohol/week: 0.0 standard drinks    Comment: very seldom  . Drug use: No  . Sexual activity: Yes  Other Topics Concern  . Not on file  Social History Narrative   1-2 sodas a day, daily consumption of tea   Lives with her husband and and their youngest daughter.   Social Determinants of Health   Financial Resource Strain:   . Difficulty of Paying Living Expenses:   Food Insecurity:   . Worried About Charity fundraiser in the Last Year:   . Arboriculturist in the Last Year:   Transportation Needs:   . Film/video editor (Medical):   Miranda Kitchen Lack of Transportation (Non-Medical):   Physical Activity:   . Days of Exercise per Week:   . Minutes of Exercise per Session:   Stress:   . Feeling of Stress :   Social Connections:   . Frequency of Communication with Friends and Family:   . Frequency of Social Gatherings with Friends and Family:   . Attends Religious Services:   . Active Member of Clubs or Organizations:   . Attends Archivist Meetings:   Miranda Kitchen Marital Status:   Intimate Partner Violence:   . Fear of Current or Ex-Partner:   . Emotionally Abused:   .  Physically Abused:   . Sexually Abused:     Family History  Problem Relation Age of Onset  . Stroke Brother   . Alzheimer's disease Brother   . Asthma Daughter   . Diabetes Sister   . Heart disease Sister   . Mental illness Sister        depression, anxiety, resolved with treatment  . Graves' disease Daughter   . Colon cancer Neg Hx   . Esophageal cancer Neg Hx   . Rectal cancer Neg Hx   . Stomach cancer Neg Hx      Review of Systems  Constitutional: Negative.  Negative for chills and fever.  HENT: Negative.  Negative for congestion and sore throat.   Respiratory: Negative.  Negative for shortness of breath.   Cardiovascular: Negative.  Negative for chest pain and palpitations.  Gastrointestinal: Negative.  Negative  for abdominal pain, diarrhea, nausea and vomiting.  Genitourinary: Negative.  Negative for dysuria and hematuria.  Musculoskeletal: Negative for back pain, myalgias and neck pain.  Skin: Negative.  Negative for rash.  Neurological: Negative for dizziness and headaches.  Endo/Heme/Allergies: Negative.   All other systems reviewed and are negative.  Today's Vitals   01/09/20 1428  BP: (!) 143/82  Pulse: (!) 59  Resp: 16  Temp: 97.8 F (36.6 C)  TempSrc: Temporal  SpO2: 99%  Weight: 243 lb (110.2 kg)  Height: 5\' 1"  (1.549 m)   Body mass index is 45.91 kg/m.   Physical Exam Vitals reviewed.  Constitutional:      Appearance: Normal appearance.  HENT:     Head: Normocephalic.  Eyes:     Extraocular Movements: Extraocular movements intact.     Conjunctiva/sclera: Conjunctivae normal.     Pupils: Pupils are equal, round, and reactive to light.  Cardiovascular:     Rate and Rhythm: Normal rate and regular rhythm.  Pulmonary:     Effort: Pulmonary effort is normal.  Musculoskeletal:     Cervical back: Normal range of motion and neck supple.     Comments: Right ankle and foot: Warm to touch, no erythema or ecchymosis, mild lateral swelling, no tenderness and excellent capillary refill.  Neurovascularly intact with full range of motion.  Skin:    General: Skin is warm and dry.     Capillary Refill: Capillary refill takes less than 2 seconds.  Neurological:     General: No focal deficit present.     Mental Status: She is alert and oriented to person, place, and time.  Psychiatric:        Mood and Affect: Mood normal.        Behavior: Behavior normal.      ASSESSMENT & PLAN: Clinically stable.  No red flag signs or symptoms.  Possible improved gout attack.  Continue present medications.  No changes.  Follow-up in 6 months.  [name] was seen today for foot swelling.  Diagnoses and all orders for this visit:  Right ankle swelling  Mild intermittent asthma without  complication -     albuterol (VENTOLIN HFA) 108 (90 Base) MCG/ACT inhaler; Inhale 2 puffs into the lungs every 4 (four) hours as needed for wheezing or shortness of breath (cough, shortness of breath or wheezing.).  Vertigo -     meclizine (ANTIVERT) 25 MG tablet; Take 1 tablet (25 mg total) by mouth 3 (three) times daily as needed for dizziness.  Essential hypertension  History of gout    Patient Instructions       If  you have lab work done today you will be contacted with your lab results within the next 2 weeks.  If you have not heard from [place] then please contact us. The fastest way to get your results is to register for My Chart.   IF you received an x-ray today, you will receive an invoice from [facility]. Please contact [facility] Radiology at [phone] with questions or concerns regarding your invoice.   IF you received labwork today, you will receive an invoice from [place]. Please contact [facility] at [phone] with questions or concerns regarding your invoice.   Our billing staff will not be able to assist you with questions regarding bills from these companies.  You will be contacted with the lab results as soon as they are available. The fastest way to get your results is to activate your My Chart account. Instructions are located on the last page of this paperwork. If you have not heard from [place] regarding the results in 2 weeks, please contact this office.        If you have lab work done today you will be contacted with your lab results within the next 2 weeks.  If you have not heard from [place] then please contact us. The fastest way to get your results is to register for My Chart.   IF you received an x-ray today, you will receive an invoice from [facility]. Please contact [facility]iology at [phone] with questions or concerns regarding your invoice.   IF you received labwork today, you will receive an invoice from [place]. Please  contact [facility] at [phone] with questions or concerns regarding your invoice.   Our billing staff will not be able to assist you with questions regarding bills from these companies.  You will be contacted with the lab results as soon as they are available. The fastest way to get your results is to activate your My Chart account. Instructions are located on the last page of this paperwork. If you have not heard from [place] regarding the results in 2 weeks, please contact this office.     Health Maintenance After Age 56 After age 46, you are at a higher risk for certain long-term diseases and infections as well as injuries from falls. Falls are a major cause of broken bones and head injuries in people who are older than age 95. Getting regular preventive care can help to keep you healthy and well. Preventive care includes getting regular testing and making lifestyle changes as recommended by your health care provider. Talk with your health care provider about:  Which screenings and tests you should have. A screening is a test that checks for a disease when you have no symptoms.  A diet and exercise plan that is right for you. What should I know about screenings and tests to prevent falls? Screening and testing are the best ways to find a health problem early. Early diagnosis and treatment give you the best chance of managing medical conditions that are common after age 43. Certain conditions and lifestyle choices may make you more likely to have a fall. Your health care provider may recommend:  Regular vision checks. Poor vision and conditions such as cataracts can make you more likely to have a fall. If you wear glasses, make sure to get your prescription updated if your vision changes.  Medicine review. Work with your health care provider to regularly review all of the medicines you are taking, including over-the-counter medicines. Ask your health care  provider about any side effects that may  make you more likely to have a fall. Tell your health care provider if any medicines that you take make you feel dizzy or sleepy.  Osteoporosis screening. Osteoporosis is a condition that causes the bones to get weaker. This can make the bones weak and cause them to break more easily.  Blood pressure screening. Blood pressure changes and medicines to control blood pressure can make you feel dizzy.  Strength and balance checks. Your health care provider may recommend certain tests to check your strength and balance while standing, walking, or changing positions.  Foot health exam. Foot pain and numbness, as well as not wearing proper footwear, can make you more likely to have a fall.  Depression screening. You may be more likely to have a fall if you have a fear of falling, feel emotionally low, or feel unable to do activities that you used to do.  Alcohol use screening. Using too much alcohol can affect your balance and may make you more likely to have a fall. What actions can I take to lower my risk of falls? General instructions  Talk with your health care provider about your risks for falling. Tell your health care provider if: ? You fall. Be sure to tell your health care provider about all falls, even ones that seem minor. ? You feel dizzy, sleepy, or off-balance.  Take over-the-counter and prescription medicines only as told by your health care provider. These include any supplements.  Eat a healthy diet and maintain a healthy weight. A healthy diet includes low-fat dairy products, low-fat (lean) meats, and fiber from whole grains, beans, and lots of fruits and vegetables. Home safety  Remove any tripping hazards, such as rugs, cords, and clutter.  Install safety equipment such as grab bars in bathrooms and safety rails on stairs.  Keep rooms and walkways well-lit. Activity   Follow a regular exercise program to stay fit. This will help you maintain your balance. Ask your health  care provider what types of exercise are appropriate for you.  If you need a cane or walker, use it as recommended by your health care provider.  Wear supportive shoes that have nonskid soles. Lifestyle  Do not drink alcohol if your health care provider tells you not to drink.  If you drink alcohol, limit how much you have: ? 0-1 drink a day for women. ? 0-2 drinks a day for men.  Be aware of how much alcohol is in your drink. In the [place] one drink equals one typical bottle of beer (12 oz), one-half glass of wine (5 oz), or one shot of hard liquor (1 oz).  Do not use any products that contain nicotine or tobacco, such as cigarettes and e-cigarettes. If you need help quitting, ask your health care provider. Summary  Having a healthy lifestyle and getting preventive care can help to protect your health and wellness after age 2.  Screening and testing are the best way to find a health problem early and help you avoid having a fall. Early diagnosis and treatment give you the best chance for managing medical conditions that are more common for people who are older than age 65.  Falls are a major cause of broken bones and head injuries in people who are older than age 16. Take precautions to prevent a fall at home.  Work with your health care provider to learn what changes you can make to improve your health and wellness and  to prevent falls. This information is not intended to replace advice given to you by your health care provider. Make sure you discuss any questions you have with your health care provider. Document Revised: 12/02/2018 Document Reviewed: 06/24/2017 Elsevier Patient Education  2020 [employer]      Agustina Caroli, MD [facility]

## 2020-01-11 NOTE — Telephone Encounter (Signed)
Duplicate

## 2020-02-08 ENCOUNTER — Ambulatory Visit: Payer: Medicare HMO | Admitting: Registered Nurse

## 2020-02-17 ENCOUNTER — Other Ambulatory Visit: Payer: Self-pay | Admitting: Emergency Medicine

## 2020-02-17 DIAGNOSIS — I10 Essential (primary) hypertension: Secondary | ICD-10-CM

## 2020-02-17 NOTE — Telephone Encounter (Signed)
Requested medication (s) are due for refill today - no filled 01/23/20 #90  Requested medication (s) are on the active medication list -yes  Future visit scheduled -yes  Last refill: 01/23/20  Notes to clinic: Patient fails lab protocol for RF- sent for review   Requested Prescriptions  Pending Prescriptions Disp Refills   atenolol-chlorthalidone (TENORETIC) 50-25 MG tablet [Pharmacy Med Name: ATENOLOL/CHLORTHALIDONE 50/25 TABS] 90 tablet 3    Sig: Take 1 tablet by mouth daily.      Cardiovascular: Beta Blocker + Diuretic Combos Failed - 02/17/2020  3:02 PM      Failed - K in normal range and within 180 days    Potassium  Date Value Ref Range Status  03/27/2018 3.7 3.5 - 5.2 mmol/L Final          Failed - Na in normal range and within 180 days    Sodium  Date Value Ref Range Status  03/27/2018 144 134 - 144 mmol/L Final          Failed - Cr in normal range and within 180 days    Creat  Date Value Ref Range Status  03/14/2016 1.18 (H) 0.50 - 0.99 mg/dL Final    Comment:      For patients > or = 72 years of age: The upper reference limit for Creatinine is approximately 13% higher for people identified as African-American.      Creatinine, Ser  Date Value Ref Range Status  03/27/2018 1.24 (H) 0.57 - 1.00 mg/dL Final          Failed - Ca in normal range and within 180 days    Calcium  Date Value Ref Range Status  03/27/2018 9.6 8.7 - 10.3 mg/dL Final          Failed - Last BP in normal range    BP Readings from Last 1 Encounters:  01/09/20 (!) 143/82          Passed - Patient is not pregnant      Passed - Last Heart Rate in normal range    Pulse Readings from Last 1 Encounters:  01/09/20 (!) 59          Passed - Valid encounter within last 6 months    Recent Outpatient Visits           1 month ago Right ankle swelling   Primary Care at [place], [place], MD   7 months ago Vertigo   Primary Care at [place], Ines Bloomer, MD   9  months ago Essential hypertension   Primary Care at [place], Ines Bloomer, MD   1 year ago Acute gouty arthritis   Primary Care at [facility], Ines Bloomer, MD   1 year ago Cough   Primary Care at [facility], Gelene Mink, PA-C       Future Appointments             In 4 months Sagardia, Ines Bloomer, MD Primary Care at [place], [facility]                Requested Prescriptions  Pending Prescriptions Disp Refills   atenolol-chlorthalidone ([place]) 50-25 MG tablet [Pharmacy Med Name: ATENOLOL/CHLORTHALIDONE 50/25 TABS] 90 tablet 3    Sig: Take 1 tablet by mouth daily.      Cardiovascular: Beta Blocker + Diuretic Combos Failed - 02/17/2020  3:02 PM      Failed - K in normal range and within 180 days  Potassium  Date Value Ref Range Status  03/27/2018 3.7 3.5 - 5.2 mmol/L Final          Failed - Na in normal range and within 180 days    Sodium  Date Value Ref Range Status  03/27/2018 144 134 - 144 mmol/L Final          Failed - Cr in normal range and within 180 days    Creat  Date Value Ref Range Status  03/14/2016 1.18 (H) 0.50 - 0.99 mg/dL Final    Comment:      For patients > or = 72 years of age: The upper reference limit for Creatinine is approximately 13% higher for people identified as African-American.      Creatinine, Ser  Date Value Ref Range Status  03/27/2018 1.24 (H) 0.57 - 1.00 mg/dL Final          Failed - Ca in normal range and within 180 days    Calcium  Date Value Ref Range Status  03/27/2018 9.6 8.7 - 10.3 mg/dL Final          Failed - Last BP in normal range    BP Readings from Last 1 Encounters:  01/09/20 (!) 143/82          Passed - Patient is not pregnant      Passed - Last Heart Rate in normal range    Pulse Readings from Last 1 Encounters:  01/09/20 (!) 59          Passed - Valid encounter within last 6 months    Recent Outpatient Visits           1 month ago Right ankle swelling   Primary Care at  [place], [place], MD   7 months ago Vertigo   Primary Care at [place], Ines Bloomer, MD   9 months ago Essential hypertension   Primary Care at [place], Ines Bloomer, MD   1 year ago Acute gouty arthritis   Primary Care at [facility], Ines Bloomer, MD   1 year ago Cough   Primary Care at [facility], Gelene Mink, PA-C       Future Appointments             In 4 months Sagardia, Ines Bloomer, MD Primary Care at [place], [facility]

## 2020-03-14 ENCOUNTER — Encounter: Payer: Self-pay | Admitting: Emergency Medicine

## 2020-03-14 ENCOUNTER — Ambulatory Visit (INDEPENDENT_AMBULATORY_CARE_PROVIDER_SITE_OTHER): Payer: Medicare HMO | Admitting: Emergency Medicine

## 2020-03-14 ENCOUNTER — Other Ambulatory Visit: Payer: Self-pay

## 2020-03-14 VITALS — BP 150/82 | HR 75 | Temp 97.9°F | Ht 60.0 in | Wt 242.6 lb

## 2020-03-14 DIAGNOSIS — M79675 Pain in left toe(s): Secondary | ICD-10-CM | POA: Diagnosis not present

## 2020-03-14 DIAGNOSIS — M109 Gout, unspecified: Secondary | ICD-10-CM | POA: Diagnosis not present

## 2020-03-14 MED ORDER — PREDNISONE 20 MG PO TABS: 40.0000 mg | ORAL_TABLET | Freq: Every day | ORAL | 0 refills | Status: AC

## 2020-03-14 MED ORDER — TRAMADOL HCL 50 MG PO TABS: 50.0000 mg | ORAL_TABLET | Freq: Three times a day (TID) | ORAL | 0 refills | Status: AC | PRN

## 2020-03-14 NOTE — Progress Notes (Signed)
Miranda [name] 74 y.o.   Chief Complaint  Patient presents with  . gout flair    left big toe. going on since sat     HISTORY OF PRESENT ILLNESS:  This is a 74 y.o. female with history of gout complaining of a typical gout flareup on left big toe that started 4 days ago. No injuries or any other associated symptoms.  HPI   Prior to Admission medications   Medication Sig Start Date End Date Taking? Authorizing Provider  albuterol (VENTOLIN HFA) 108 (90 Base) MCG/ACT inhaler Inhale 2 puffs into the lungs every 4 (four) hours as needed for wheezing or shortness of breath (cough, shortness of breath or wheezing.). 01/09/20  Yes Laylamarie Meuser, Ines Bloomer, MD  atenolol-chlorthalidone (TENORETIC) 50-25 MG tablet TAKE 1 TABLET BY MOUTH DAILY 02/17/20  Yes Horald Pollen, MD  lisinopril (ZESTRIL) 40 MG tablet One daily 04/25/19  Yes Alvah Gilder, Ines Bloomer, MD  meclizine (ANTIVERT) 25 MG tablet Take 1 tablet (25 mg total) by mouth 3 (three) times daily as needed for dizziness. 01/09/20  Yes SagardiaInes Bloomer, MD    No Known Allergies  Patient Active Problem List   Diagnosis Date Noted  . Environmental allergies 03/27/2018  . BMI 45.0-49.9, adult ([place]) 03/14/2016  . Pre-diabetes 02/28/2015  . HTN (hypertension) 01/17/2012    Past Medical History:  Diagnosis Date  . Allergy   . Arthritis   . Hypertension     Past Surgical History:  Procedure Laterality Date  . ABDOMINAL HYSTERECTOMY    . breast biopsy Left    benign  . CHOLECYSTECTOMY      Social History   Socioeconomic History  . Marital status: Married    Spouse name: Sheppard Coil  . Number of children: 2  . Years of education: HS  . Highest education level: Not on file  Occupational History  . Occupation: Deli Counter    Comment: [employer]  Tobacco Use  . Smoking status: Never Smoker  . Smokeless tobacco: Never Used  Vaping Use  . Vaping Use: Never used  Substance and Sexual Activity  . Alcohol use: Yes     Alcohol/week: 0.0 standard drinks    Comment: very seldom  . Drug use: No  . Sexual activity: Yes  Other Topics Concern  . Not on file  Social History Narrative   1-2 sodas a day, daily consumption of tea   Lives with her husband and and their youngest daughter.   Social Determinants of Health   Financial Resource Strain:   . Difficulty of Paying Living Expenses:   Food Insecurity:   . Worried About Charity fundraiser in the Last Year:   . Arboriculturist in the Last Year:   Transportation Needs:   . Film/video editor (Medical):   Marland Kitchen Lack of Transportation (Non-Medical):   Physical Activity:   . Days of Exercise per Week:   . Minutes of Exercise per Session:   Stress:   . Feeling of Stress :   Social Connections:   . Frequency of Communication with Friends and Family:   . Frequency of Social Gatherings with Friends and Family:   . Attends Religious Services:   . Active Member of Clubs or Organizations:   . Attends Archivist Meetings:   Marland Kitchen Marital Status:   Intimate Partner Violence:   . Fear of Current or Ex-Partner:   . Emotionally Abused:   Marland Kitchen Physically Abused:   . Sexually Abused:  Family History  Problem Relation Age of Onset  . Stroke Brother   . Alzheimer's disease Brother   . Asthma Daughter   . Diabetes Sister   . Heart disease Sister   . Mental illness Sister        depression, anxiety, resolved with treatment  . Graves' disease Daughter   . Colon cancer Neg Hx   . Esophageal cancer Neg Hx   . Rectal cancer Neg Hx   . Stomach cancer Neg Hx      Review of Systems  Constitutional: Negative.  Negative for chills and fever.  HENT: Negative.  Negative for congestion and sore throat.   Respiratory: Negative.  Negative for shortness of breath.   Cardiovascular: Negative.  Negative for chest pain and palpitations.  Gastrointestinal: Negative for abdominal pain, diarrhea, nausea and vomiting.  Genitourinary: Negative.   Skin: Negative.   Negative for rash.  Neurological: Negative.  Negative for dizziness and headaches.  All other systems reviewed and are negative.  Today's Vitals   03/14/20 0847  BP: (!) 150/82  Pulse: 75  Temp: 97.9 F (36.6 C)  TempSrc: Temporal  SpO2: 95%  Weight: 242 lb 9.6 oz (110 kg)  Height: 5' (1.524 m)   Body mass index is 47.38 kg/m.   Physical Exam Vitals reviewed.  Constitutional:      Appearance: She is obese.  HENT:     Head: Normocephalic.  Eyes:     Extraocular Movements: Extraocular movements intact.     Pupils: Pupils are equal, round, and reactive to light.  Cardiovascular:     Rate and Rhythm: Normal rate.  Pulmonary:     Effort: Pulmonary effort is normal.  Musculoskeletal:     Cervical back: Normal range of motion.     Comments: Left foot: Positive redness, tenderness, and swelling to first MTP joint.  Painful range of motion.  Skin:    General: Skin is warm.  Neurological:     General: No focal deficit present.     Mental Status: She is alert and oriented to person, place, and time.  Psychiatric:        Mood and Affect: Mood normal.        Behavior: Behavior normal.      ASSESSMENT & PLAN: Nikitia was seen today for gout flair.  Diagnoses and all orders for this visit:  Acute gouty arthritis -     predniSONE (DELTASONE) 20 MG tablet; Take 2 tablets (40 mg total) by mouth daily with breakfast for 5 days.  Pain of toe of left foot -     traMADol (ULTRAM) 50 MG tablet; Take 1 tablet (50 mg total) by mouth every 8 (eight) hours as needed for up to 5 days.    Patient Instructions       If you have lab work done today you will be contacted with your lab results within the next 2 weeks.  If you have not heard from [place] then please contact us. The fastest way to get your results is to register for My Chart.   IF you received an x-ray today, you will receive an invoice from [facility]. Please contact [facility] at [phone] with  questions or concerns regarding your invoice.   IF you received labwork today, you will receive an invoice from [facility]. Please contact [facility] at [phone] with questions or concerns regarding your invoice.   Our billing staff will not be able to assist you with questions regarding bills from these  companies.  You will be contacted with the lab results as soon as they are available. The fastest way to get your results is to activate your My Chart account. Instructions are located on the last page of this paperwork. If you have not heard from [place] regarding the results in 2 weeks, please contact this office.      Gout  Gout is painful swelling of your joints. Gout is a type of arthritis. It is caused by having too much uric acid in your body. Uric acid is a chemical that is made when your body breaks down substances called purines. If your body has too much uric acid, sharp crystals can form and build up in your joints. This causes pain and swelling. Gout attacks can happen quickly and be very painful (acute gout). Over time, the attacks can affect more joints and happen more often (chronic gout). What are the causes?  Too much uric acid in your blood. This can happen because: ? Your kidneys do not remove enough uric acid from your blood. ? Your body makes too much uric acid. ? You eat too many foods that are high in purines. These foods include organ meats, some seafood, and beer.  Trauma or stress. What increases the risk?  Having a family history of gout.  Being female and middle-aged.  Being female and having gone through menopause.  Being very overweight (obese).  Drinking alcohol, especially beer.  Not having enough water in the body (being dehydrated).  Losing weight too quickly.  Having an organ transplant.  Having lead poisoning.  Taking certain medicines.  Having kidney disease.  Having a skin condition called psoriasis. What are the signs or symptoms? An  attack of acute gout usually happens in just one joint. The most common place is the big toe. Attacks often start at night. Other joints that may be affected include joints of the feet, ankle, knee, fingers, wrist, or elbow. Symptoms of an attack may include:  Very bad pain.  Warmth.  Swelling.  Stiffness.  Shiny, red, or purple skin.  Tenderness. The affected joint may be very painful to touch.  Chills and fever. Chronic gout may cause symptoms more often. More joints may be involved. You may also have white or yellow lumps (tophi) on your hands or feet or in other areas near your joints. How is this treated?  Treatment for this condition has two phases: treating an acute attack and preventing future attacks.  Acute gout treatment may include: ? NSAIDs. ? Steroids. These are taken by mouth or injected into a joint. ? Colchicine. This medicine relieves pain and swelling. It can be given by mouth or through an IV tube.  Preventive treatment may include: ? Taking small doses of NSAIDs or colchicine daily. ? Using a medicine that reduces uric acid levels in your blood. ? Making changes to your diet. You may need to see a food expert (dietitian) about what to eat and drink to prevent gout. Follow these instructions at home: During a gout attack   If told, put ice on the painful area: ? Put ice in a plastic bag. ? Place a towel between your skin and the bag. ? Leave the ice on for 20 minutes, 2-3 times a day.  Raise (elevate) the painful joint above the level of your heart as often as you can.  Rest the joint as much as possible. If the joint is in your leg, you may be given crutches.  Follow  instructions from your doctor about what you cannot eat or drink. Avoiding future gout attacks  Eat a low-purine diet. Avoid foods and drinks such as: ? Liver. ? Kidney. ? Anchovies. ? Asparagus. ? Herring. ? Mushrooms. ? Mussels. ? Beer.  Stay at a healthy weight. If you want  to lose weight, talk with your doctor. Do not lose weight too fast.  Start or continue an exercise plan as told by your doctor. Eating and drinking  Drink enough fluids to keep your pee (urine) pale yellow.  If you drink alcohol: ? Limit how much you use to:  0-1 drink a day for women.  0-2 drinks a day for men. ? Be aware of how much alcohol is in your drink. In the [place] one drink equals one 12 oz bottle of beer (355 mL), one 5 oz glass of wine (148 mL), or one 1 oz glass of hard liquor (44 mL). General instructions  Take over-the-counter and prescription medicines only as told by your doctor.  Do not drive or use heavy machinery while taking prescription pain medicine.  Return to your normal activities as told by your doctor. Ask your doctor what activities are safe for you.  Keep all follow-up visits as told by your doctor. This is important. Contact a doctor if:  You have another gout attack.  You still have symptoms of a gout attack after 10 days of treatment.  You have problems (side effects) because of your medicines.  You have chills or a fever.  You have burning pain when you pee (urinate).  You have pain in your lower back or belly. Get help right away if:  You have very bad pain.  Your pain cannot be controlled.  You cannot pee. Summary  Gout is painful swelling of the joints.  The most common site of pain is the big toe, but it can affect other joints.  Medicines and avoiding some foods can help to prevent and treat gout attacks. This information is not intended to replace advice given to you by your health care provider. Make sure you discuss any questions you have with your health care provider. Document Revised: 03/03/2018 Document Reviewed: 03/03/2018 Elsevier Patient Education  2020 Elsevier Inc.      Agustina Caroli, MD [facility]

## 2020-03-14 NOTE — Patient Instructions (Addendum)
If you have lab work done today you will be contacted with your lab results within the next 2 weeks.  If you have not heard from [place] then please contact us. The fastest way to get your results is to register for My Chart.   IF you received an x-ray today, you will receive an invoice from [facility]. Please contact [facility] at [phone] with questions or concerns regarding your invoice.   IF you received labwork today, you will receive an invoice from [facility]. Please contact [facility] at [phone] with questions or concerns regarding your invoice.   Our billing staff will not be able to assist you with questions regarding bills from these companies.  You will be contacted with the lab results as soon as they are available. The fastest way to get your results is to activate your My Chart account. Instructions are located on the last page of this paperwork. If you have not heard from [place] regarding the results in 2 weeks, please contact this office.      Gout  Gout is painful swelling of your joints. Gout is a type of arthritis. It is caused by having too much uric acid in your body. Uric acid is a chemical that is made when your body breaks down substances called purines. If your body has too much uric acid, sharp crystals can form and build up in your joints. This causes pain and swelling. Gout attacks can happen quickly and be very painful (acute gout). Over time, the attacks can affect more joints and happen more often (chronic gout). What are the causes?  Too much uric acid in your blood. This can happen because: ? Your kidneys do not remove enough uric acid from your blood. ? Your body makes too much uric acid. ? You eat too many foods that are high in purines. These foods include organ meats, some seafood, and beer.  Trauma or stress. What increases the risk?  Having a family history of gout.  Being female and middle-aged.  Being female and having  gone through menopause.  Being very overweight (obese).  Drinking alcohol, especially beer.  Not having enough water in the body (being dehydrated).  Losing weight too quickly.  Having an organ transplant.  Having lead poisoning.  Taking certain medicines.  Having kidney disease.  Having a skin condition called psoriasis. What are the signs or symptoms? An attack of acute gout usually happens in just one joint. The most common place is the big toe. Attacks often start at night. Other joints that may be affected include joints of the feet, ankle, knee, fingers, wrist, or elbow. Symptoms of an attack may include:  Very bad pain.  Warmth.  Swelling.  Stiffness.  Shiny, red, or purple skin.  Tenderness. The affected joint may be very painful to touch.  Chills and fever. Chronic gout may cause symptoms more often. More joints may be involved. You may also have white or yellow lumps (tophi) on your hands or feet or in other areas near your joints. How is this treated?  Treatment for this condition has two phases: treating an acute attack and preventing future attacks.  Acute gout treatment may include: ? NSAIDs. ? Steroids. These are taken by mouth or injected into a joint. ? Colchicine. This medicine relieves pain and swelling. It can be given by mouth or through an IV tube.  Preventive treatment may include: ? Taking small doses of NSAIDs or colchicine daily. ? Using  medicine that reduces uric acid levels in your blood. ? Making changes to your diet. You may need to see a food expert (dietitian) about what to eat and drink to prevent gout. Follow these instructions at home: During a gout attack   If told, put ice on the painful area: ? Put ice in a plastic bag. ? Place a towel between your skin and the bag. ? Leave the ice on for 20 minutes, 2-3 times a day.  Raise (elevate) the painful joint above the level of your heart as often as you can.  Rest the joint as  much as possible. If the joint is in your leg, you may be given crutches.  Follow instructions from your doctor about what you cannot eat or drink. Avoiding future gout attacks  Eat a low-purine diet. Avoid foods and drinks such as: ? Liver. ? Kidney. ? Anchovies. ? Asparagus. ? Herring. ? Mushrooms. ? Mussels. ? Beer.  Stay at a healthy weight. If you want to lose weight, talk with your doctor. Do not lose weight too fast.  Start or continue an exercise plan as told by your doctor. Eating and drinking  Drink enough fluids to keep your pee (urine) pale yellow.  If you drink alcohol: ? Limit how much you use to:  0-1 drink a day for women.  0-2 drinks a day for men. ? Be aware of how much alcohol is in your drink. In the [place] one drink equals one 12 oz bottle of beer (355 mL), one 5 oz glass of wine (148 mL), or one 1 oz glass of hard liquor (44 mL). General instructions  Take over-the-counter and prescription medicines only as told by your doctor.  Do not drive or use heavy machinery while taking prescription pain medicine.  Return to your normal activities as told by your doctor. Ask your doctor what activities are safe for you.  Keep all follow-up visits as told by your doctor. This is important. Contact a doctor if:  You have another gout attack.  You still have symptoms of a gout attack after 10 days of treatment.  You have problems (side effects) because of your medicines.  You have chills or a fever.  You have burning pain when you pee (urinate).  You have pain in your lower back or belly. Get help right away if:  You have very bad pain.  Your pain cannot be controlled.  You cannot pee. Summary  Gout is painful swelling of the joints.  The most common site of pain is the big toe, but it can affect other joints.  Medicines and avoiding some foods can help to prevent and treat gout attacks. This information is not intended to replace advice given  to you by your health care provider. Make sure you discuss any questions you have with your health care provider. Document Revised: 03/03/2018 Document Reviewed: 03/03/2018 Elsevier Patient Education  2020 Elsevier Inc.  

## 2020-03-15 ENCOUNTER — Telehealth: Payer: Self-pay | Admitting: *Deleted

## 2020-03-15 NOTE — Telephone Encounter (Signed)
Schedule AWV.  

## 2020-04-13 ENCOUNTER — Other Ambulatory Visit: Payer: Self-pay | Admitting: Emergency Medicine

## 2020-04-13 DIAGNOSIS — I10 Essential (primary) hypertension: Secondary | ICD-10-CM

## 2020-04-27 DIAGNOSIS — H40033 Anatomical narrow angle, bilateral: Secondary | ICD-10-CM | POA: Diagnosis not present

## 2020-04-27 DIAGNOSIS — H2513 Age-related nuclear cataract, bilateral: Secondary | ICD-10-CM | POA: Diagnosis not present

## 2020-05-06 ENCOUNTER — Other Ambulatory Visit: Payer: Self-pay | Admitting: Emergency Medicine

## 2020-05-06 DIAGNOSIS — I10 Essential (primary) hypertension: Secondary | ICD-10-CM

## 2020-05-06 NOTE — Telephone Encounter (Signed)
.   Requested Prescriptions  Pending Prescriptions Disp Refills  . lisinopril (ZESTRIL) 40 MG tablet [Pharmacy Med Name: LISINOPRIL 40MG  TABLETS] 90 tablet 1    Sig: TAKE 1 TABLET BY MOUTH DAILY     Cardiovascular:  ACE Inhibitors Failed - 05/06/2020 11:51 AM      Failed - Cr in normal range and within 180 days    Creat  Date Value Ref Range Status  03/14/2016 1.18 (H) 0.50 - 0.99 mg/dL Final    Comment:      For patients > or = 72 years of age: The upper reference limit for Creatinine is approximately 13% higher for people identified as African-American.      Creatinine, Ser  Date Value Ref Range Status  03/27/2018 1.24 (H) 0.57 - 1.00 mg/dL Final         Failed - K in normal range and within 180 days    Potassium  Date Value Ref Range Status  03/27/2018 3.7 3.5 - 5.2 mmol/L Final         Failed - Last BP in normal range    BP Readings from Last 1 Encounters:  03/14/20 (!) 150/82         Passed - Patient is not pregnant      Passed - Valid encounter within last 6 months    Recent Outpatient Visits          1 month ago Acute gouty arthritis   Primary Care at [place], [place], MD   3 months ago Right ankle swelling   Primary Care at [facility], Ines Bloomer, MD   10 months ago Vertigo   Primary Care at [place], Ines Bloomer, MD   1 year ago Essential hypertension   Primary Care at [place], Ines Bloomer, MD   1 year ago Acute gouty arthritis   Primary Care at [facility], Ines Bloomer, MD      Future Appointments            In 2 months Sagardia, Ines Bloomer, MD Primary Care at [place], [facility]

## 2020-05-29 ENCOUNTER — Telehealth: Payer: Self-pay | Admitting: *Deleted

## 2020-05-29 NOTE — Telephone Encounter (Signed)
Schedule AWV.  

## 2020-06-15 ENCOUNTER — Encounter: Payer: Self-pay | Admitting: Family Medicine

## 2020-06-15 ENCOUNTER — Other Ambulatory Visit: Payer: Self-pay

## 2020-06-15 ENCOUNTER — Ambulatory Visit (INDEPENDENT_AMBULATORY_CARE_PROVIDER_SITE_OTHER): Payer: Medicare Other | Admitting: Family Medicine

## 2020-06-15 VITALS — BP 138/84 | HR 60 | Temp 97.7°F | Ht 60.0 in | Wt 240.8 lb

## 2020-06-15 DIAGNOSIS — Z23 Encounter for immunization: Secondary | ICD-10-CM | POA: Diagnosis not present

## 2020-06-15 DIAGNOSIS — I10 Essential (primary) hypertension: Secondary | ICD-10-CM

## 2020-06-15 DIAGNOSIS — R7303 Prediabetes: Secondary | ICD-10-CM | POA: Diagnosis not present

## 2020-06-15 DIAGNOSIS — M109 Gout, unspecified: Secondary | ICD-10-CM | POA: Diagnosis not present

## 2020-06-15 MED ORDER — PREDNISONE 20 MG PO TABS: ORAL_TABLET | ORAL | 0 refills | Status: DC

## 2020-06-15 NOTE — Progress Notes (Signed)
Duplicate below

## 2020-06-15 NOTE — Progress Notes (Signed)
Patient ID: Miranda [name], female    DOB: 09/12/1945  Age: 74 y.o. MRN: [id number]  Chief Complaint  Patient presents with  . Gout    flare in the left big toe, asking for refill on prednisone and tramadol.   . Immunizations    wants prevnar 13 and high dose flu    Subjective:   Patient is here because she has a flareup of gout.  She has had a couple of episodes in the past, and cannot remember when the last one was.  Has not taken any medication for this.  Is been bothering her for few days.  She does not know of any dietary changes that might of triggered it.  No medication changes.  She wants her flu shot and pneumonia vaccine today.  Current allergies, medications, problem list, past/family and social histories reviewed.  Objective:  BP 138/84   Pulse 60   Temp 97.7 F (36.5 C)   Ht 5' (1.524 m)   Wt 240 lb 12.8 oz (109.2 kg)   SpO2 97%   BMI 47.03 kg/m   No acute distress.  No swelling or warmth of the foot.  However she is very tender in the left first MTP joint.  Assessment & Plan:   Assessment: 1. Acute gouty arthritis   2. Pre-diabetes   3. Need for vaccination   4. Essential hypertension, benign       Plan: See instructions  Orders Placed This Encounter  Procedures  . Flu Vaccine QUAD High Dose(Fluad)  . Pneumococcal conjugate vaccine 13-valent IM  . Uric acid  . Hemoglobin A1c  . Comprehensive metabolic panel    Meds ordered this encounter  Medications  . predniSONE (DELTASONE) 20 MG tablet    Sig: Take 3 pills daily for 2 days after breakfast, then 2 daily for 2 days, then 1 daily for 2 days.  Or gallop.    Dispense:  12 tablet    Refill:  0         Patient Instructions    Take prednisone 20 mg 3 daily for 2 days, then 2 daily for 2 days, then 1 daily for 2 days.  It might be best if you can wait until tomorrow morning to start taking it because if you take it night it tends to make your sleep very restless, but if you are having too much  pain go ahead and take the first 3 tonight.  Drink plenty of fluids to keep her kidneys flushed through well  Labs are pending.  Return if worse.  If you keep having recurrent episodes of gout you might need to be placed on a chronic medication for it.  If you have lab work done today you will be contacted with your lab results within the next 2 weeks.  If you have not heard from [place] then please contact us. The fastest way to get your results is to register for My Chart.   IF you received an x-ray today, you will receive an invoice from [facility]. Please contact [facility] at [phone] with questions or concerns regarding your invoice.   IF you received labwork today, you will receive an invoice from [place]. Please contact [facility] at [phone] with questions or concerns regarding your invoice.   Our billing staff will not be able to assist you with questions regarding bills from these companies.  You will be contacted with the lab results as soon as they are available. The fastest way to  get your results is to activate your My Chart account. Instructions are located on the last page of this paperwork. If you have not heard from [place] regarding the results in 2 weeks, please contact this office.        Return if symptoms worsen or fail to improve.   Ruben Reason, MD 06/15/2020

## 2020-06-15 NOTE — Patient Instructions (Addendum)
  Take prednisone 20 mg 3 daily for 2 days, then 2 daily for 2 days, then 1 daily for 2 days.  It might be best if you can wait until tomorrow morning to start taking it because if you take it night it tends to make your sleep very restless, but if you are having too much pain go ahead and take the first 3 tonight.  Drink plenty of fluids to keep her kidneys flushed through well  Labs are pending.  Return if worse.  If you keep having recurrent episodes of gout you might need to be placed on a chronic medication for it.  If you have lab work done today you will be contacted with your lab results within the next 2 weeks.  If you have not heard from [place] then please contact us. The fastest way to get your results is to register for My Chart.   IF you received an x-ray today, you will receive an invoice from [facility]. Please contact [facility] at [phone] with questions or concerns regarding your invoice.   IF you received labwork today, you will receive an invoice from [place]. Please contact [facility] at [phone] with questions or concerns regarding your invoice.   Our billing staff will not be able to assist you with questions regarding bills from these companies.  You will be contacted with the lab results as soon as they are available. The fastest way to get your results is to activate your My Chart account. Instructions are located on the last page of this paperwork. If you have not heard from [place] regarding the results in 2 weeks, please contact this office.

## 2020-06-16 LAB — COMPREHENSIVE METABOLIC PANEL
ALT: 11 IU/L (ref 0–32)
AST: 14 IU/L (ref 0–40)
Albumin/Globulin Ratio: 1.4 (ref 1.2–2.2)
Albumin: 4.2 g/dL (ref 3.7–4.7)
Alkaline Phosphatase: 127 IU/L — ABNORMAL HIGH (ref 44–121)
BUN/Creatinine Ratio: 21 (ref 12–28)
BUN: 30 mg/dL — ABNORMAL HIGH (ref 8–27)
Bilirubin Total: 0.3 mg/dL (ref 0.0–1.2)
CO2: 26 mmol/L (ref 20–29)
Calcium: 9.4 mg/dL (ref 8.7–10.3)
Chloride: 102 mmol/L (ref 96–106)
Creatinine, Ser: 1.42 mg/dL — ABNORMAL HIGH (ref 0.57–1.00)
GFR calc Af Amer: 43 mL/min/{1.73_m2} — ABNORMAL LOW (ref >59)
GFR calc non Af Amer: 37 mL/min/{1.73_m2} — ABNORMAL LOW (ref >59)
Globulin, Total: 3 g/dL (ref 1.5–4.5)
Glucose: 107 mg/dL — ABNORMAL HIGH (ref 65–99)
Potassium: 3.9 mmol/L (ref 3.5–5.2)
Sodium: 141 mmol/L (ref 134–144)
Total Protein: 7.2 g/dL (ref 6.0–8.5)

## 2020-06-16 LAB — URIC ACID: Uric Acid: 9 mg/dL — ABNORMAL HIGH (ref 3.1–7.9)

## 2020-06-16 LAB — HEMOGLOBIN A1C
Est. average glucose Bld gHb Est-mCnc: 126 mg/dL
Hgb A1c MFr Bld: 6 % — ABNORMAL HIGH (ref 4.8–5.6)

## 2020-06-22 ENCOUNTER — Telehealth: Payer: Self-pay | Admitting: Family Medicine

## 2020-06-22 NOTE — Telephone Encounter (Signed)
Pt would like a cb concerning  her most recent lab results. Please advise at [phone].

## 2020-06-25 NOTE — Telephone Encounter (Signed)
LVM for pt to return call to inform her in detail of her lab results and recommendations.

## 2020-07-03 ENCOUNTER — Encounter: Payer: Self-pay | Admitting: Registered Nurse

## 2020-07-03 ENCOUNTER — Other Ambulatory Visit: Payer: Self-pay

## 2020-07-03 ENCOUNTER — Ambulatory Visit (INDEPENDENT_AMBULATORY_CARE_PROVIDER_SITE_OTHER): Payer: Medicare Other | Admitting: Registered Nurse

## 2020-07-03 ENCOUNTER — Ambulatory Visit (INDEPENDENT_AMBULATORY_CARE_PROVIDER_SITE_OTHER): Payer: Medicare Other

## 2020-07-03 VITALS — BP 150/80 | HR 60 | Temp 97.7°F | Resp 15 | Ht 60.0 in | Wt 241.4 lb

## 2020-07-03 DIAGNOSIS — M109 Gout, unspecified: Secondary | ICD-10-CM | POA: Diagnosis not present

## 2020-07-03 DIAGNOSIS — M7989 Other specified soft tissue disorders: Secondary | ICD-10-CM | POA: Diagnosis not present

## 2020-07-03 DIAGNOSIS — R600 Localized edema: Secondary | ICD-10-CM | POA: Diagnosis not present

## 2020-07-03 DIAGNOSIS — M79675 Pain in left toe(s): Secondary | ICD-10-CM

## 2020-07-03 MED ORDER — ALLOPURINOL 100 MG PO TABS: 50.0000 mg | ORAL_TABLET | Freq: Every day | ORAL | 1 refills | Status: DC

## 2020-07-03 MED ORDER — TRAMADOL HCL 50 MG PO TABS: 50.0000 mg | ORAL_TABLET | Freq: Two times a day (BID) | ORAL | 0 refills | Status: AC | PRN

## 2020-07-03 NOTE — Progress Notes (Signed)
Acute Office Visit  Subjective:    Patient ID: Miranda [name], female    DOB: [birth year], 74 y.o.   MRN: [id number]  Chief Complaint  Patient presents with  . Foot Pain    Patient states she has been having some left foot pain since thursday night. Per patient this pain is different from from gout. Sh states its constant pain and starting to swell.    HPI Patient is in today for acute pain  L great toe. Since Thursday. Feels swollen and warm Does not feel like past gout flares - this feels more like a "pulling" pain.  Does not recall any acute injury or precipitating event Has taken advil which has relieved some pain, though it usually doesn't for her gout. Worse as the day goes on, worst at night. No acute pain first step in the morning. No heel pain Full ROM with some pain in joint of L great toe  Past Medical History:  Diagnosis Date  . Allergy   . Arthritis   . Hypertension     Past Surgical History:  Procedure Laterality Date  . ABDOMINAL HYSTERECTOMY    . breast biopsy Left    benign  . CHOLECYSTECTOMY      Family History  Problem Relation Age of Onset  . Stroke Brother   . Alzheimer's disease Brother   . Asthma Daughter   . Diabetes Sister   . Heart disease Sister   . Mental illness Sister        depression, anxiety, resolved with treatment  . Graves' disease Daughter   . Colon cancer Neg Hx   . Esophageal cancer Neg Hx   . Rectal cancer Neg Hx   . Stomach cancer Neg Hx     Social History   Socioeconomic History  . Marital status: Married    Spouse name: Sheppard Coil  . Number of children: 2  . Years of education: HS  . Highest education level: Not on file  Occupational History  . Occupation: Deli Counter    Comment: [employer]  Tobacco Use  . Smoking status: Never Smoker  . Smokeless tobacco: Never Used  Vaping Use  . Vaping Use: Never used  Substance and Sexual Activity  . Alcohol use: Yes    Alcohol/week: 0.0 standard drinks    Comment:  very seldom  . Drug use: No  . Sexual activity: Yes  Other Topics Concern  . Not on file  Social History Narrative   1-2 sodas a day, daily consumption of tea   Lives with her husband and and their youngest daughter.   Social Determinants of Health   Financial Resource Strain:   . Difficulty of Paying Living Expenses: Not on file  Food Insecurity:   . Worried About Charity fundraiser in the Last Year: Not on file  . Ran Out of Food in the Last Year: Not on file  Transportation Needs:   . Lack of Transportation (Medical): Not on file  . Lack of Transportation (Non-Medical): Not on file  Physical Activity:   . Days of Exercise per Week: Not on file  . Minutes of Exercise per Session: Not on file  Stress:   . Feeling of Stress : Not on file  Social Connections:   . Frequency of Communication with Friends and Family: Not on file  . Frequency of Social Gatherings with Friends and Family: Not on file  . Attends Religious Services: Not on file  . Active Member  of Clubs or Organizations: Not on file  . Attends Archivist Meetings: Not on file  . Marital Status: Not on file  Intimate Partner Violence:   . Fear of Current or Ex-Partner: Not on file  . Emotionally Abused: Not on file  . Physically Abused: Not on file  . Sexually Abused: Not on file    Outpatient Medications Prior to Visit  Medication Sig Dispense Refill  . albuterol (VENTOLIN HFA) 108 (90 Base) MCG/ACT inhaler Inhale 2 puffs into the lungs every 4 (four) hours as needed for wheezing or shortness of breath (cough, shortness of breath or wheezing.). 18 g 3  . atenolol-chlorthalidone (TENORETIC) 50-25 MG tablet TAKE 1 TABLET BY MOUTH DAILY 90 tablet 3  . lisinopril (ZESTRIL) 40 MG tablet TAKE 1 TABLET BY MOUTH DAILY 90 tablet 1  . meclizine (ANTIVERT) 25 MG tablet Take 1 tablet (25 mg total) by mouth 3 (three) times daily as needed for dizziness. 30 tablet 0  . predniSONE (DELTASONE) 20 MG tablet Take 3 pills  daily for 2 days after breakfast, then 2 daily for 2 days, then 1 daily for 2 days.  Or gallop. (Patient not taking: Reported on 07/03/2020) 12 tablet 0   No facility-administered medications prior to visit.    No Known Allergies  Review of Systems  Constitutional: Negative.   HENT: Negative.   Eyes: Negative.   Respiratory: Negative.   Cardiovascular: Negative.   Gastrointestinal: Negative.   Genitourinary: Negative.   Musculoskeletal: Negative.   Skin: Negative.   Neurological: Negative.   Psychiatric/Behavioral: Negative.        Objective:    Physical Exam Vitals and nursing note reviewed.  Constitutional:      Appearance: Normal appearance. She is obese.  Cardiovascular:     Rate and Rhythm: Normal rate and regular rhythm.  Pulmonary:     Effort: Pulmonary effort is normal. No respiratory distress.  Musculoskeletal:        General: Swelling and tenderness present. Normal range of motion.     Comments: L great toe  Skin:    General: Skin is warm and dry.     Capillary Refill: Capillary refill takes less than 2 seconds.     Findings: Erythema (joint of L great toe) present.  Neurological:     General: No focal deficit present.     Mental Status: She is alert and oriented to person, place, and time. Mental status is at baseline.  Psychiatric:        Mood and Affect: Mood normal.        Behavior: Behavior normal.        Thought Content: Thought content normal.        Judgment: Judgment normal.     BP (!) 150/80   Pulse 60   Temp 97.7 F (36.5 C) (Temporal)   Resp 15   Ht 5' (1.524 m)   Wt 241 lb 6.4 oz (109.5 kg)   SpO2 97%   BMI 47.15 kg/m  Wt Readings from Last 3 Encounters:  07/03/20 241 lb 6.4 oz (109.5 kg)  06/15/20 240 lb 12.8 oz (109.2 kg)  03/14/20 242 lb 9.6 oz (110 kg)    There are no preventive care reminders to display for this patient.  There are no preventive care reminders to display for this patient.   Lab Results  Component Value  Date   TSH 3.03 03/14/2016   Lab Results  Component Value Date   WBC 7.0 03/16/2017  HGB 11.7 03/16/2017   HCT 34.7 03/16/2017   MCV 73 (L) 03/16/2017   PLT 248 03/16/2017   Lab Results  Component Value Date   NA 141 06/15/2020   K 3.9 06/15/2020   CO2 26 06/15/2020   GLUCOSE 107 (H) 06/15/2020   BUN 30 (H) 06/15/2020   CREATININE 1.42 (H) 06/15/2020   BILITOT 0.3 06/15/2020   ALKPHOS 127 (H) 06/15/2020   AST 14 06/15/2020   ALT 11 06/15/2020   PROT 7.2 06/15/2020   ALBUMIN 4.2 06/15/2020   CALCIUM 9.4 06/15/2020   Lab Results  Component Value Date   CHOL 188 03/16/2017   Lab Results  Component Value Date   HDL 77 03/16/2017   Lab Results  Component Value Date   LDLCALC 98 03/16/2017   Lab Results  Component Value Date   TRIG 64 03/16/2017   Lab Results  Component Value Date   CHOLHDL 2.4 03/16/2017   Lab Results  Component Value Date   HGBA1C 6.0 (H) 06/15/2020       Assessment & Plan:   Problem List Items Addressed This Visit    None    Visit Diagnoses    Pain of left great toe    -  Primary   Relevant Medications   traMADol (ULTRAM) 50 MG tablet   Other Relevant Orders   DG Toe Great Left (Completed)   Ambulatory referral to Podiatry   Gouty arthritis of great toe       Relevant Medications   allopurinol (ZYLOPRIM) 100 MG tablet   traMADol (ULTRAM) 50 MG tablet   Other Relevant Orders   Ambulatory referral to Podiatry       Meds ordered this encounter  Medications  . allopurinol (ZYLOPRIM) 100 MG tablet    Sig: Take 0.5 tablets (50 mg total) by mouth daily.    Dispense:  90 tablet    Refill:  1    Order Specific Question:   Supervising Provider    Answer:   Carlota Raspberry, JEFFREY R [[id number]]  . traMADol (ULTRAM) 50 MG tablet    Sig: Take 1 tablet (50 mg total) by mouth 2 (two) times daily as needed for up to 8 days.    Dispense:  15 tablet    Refill:  0    Order Specific Question:   Supervising Provider    Answer:   Carlota Raspberry, JEFFREY R  [[id number]]   PLAN  Xray shows soft tissue swelling  Suspect ongoing gout flare.  Given renal function will not repeat steroids  Will start 50mg  allopurinol daily  Tramadol 1-2 times daily as needed for pain  Refer to podiatry for further work up given her uncertainty that this is gout  Patient encouraged to call clinic with any questions, comments, or concerns.   Maximiano Coss, NP

## 2020-07-03 NOTE — Patient Instructions (Addendum)
Miranda [name] -   I am still suspicious that your pain is being caused by gout. We were seeing on your last labs that your Uric Acid is high. I think our best bet at this time is to start a daily medication:  Allopurinol 100mg  by mouth once daily. This will lower uric acid levels and prevent this pain from coming back For short term relief, I have sent over: Tramadol 50mg  to take 1-2 times daily as needed for pain This medication may be mildly sedative - I'd prefer if you take it mostly during evenings. In addition: I have sent a referral to podiatry. I want to make sure there are no other aspects contributing to pain. They will call you in the coming 1-2 weeks.  Thank you  Kathrin Ruddy, NP    If you have lab work done today you will be contacted with your lab results within the next 2 weeks.  If you have not heard from [place] then please contact us. The fastest way to get your results is to register for My Chart.   IF you received an x-ray today, you will receive an invoice from [facility]. Please contact [facility] at [phone] with questions or concerns regarding your invoice.   IF you received labwork today, you will receive an invoice from [place]. Please contact [facility] at [phone] with questions or concerns regarding your invoice.   Our billing staff will not be able to assist you with questions regarding bills from these companies.  You will be contacted with the lab results as soon as they are available. The fastest way to get your results is to activate your My Chart account. Instructions are located on the last page of this paperwork. If you have not heard from [place] regarding the results in 2 weeks, please contact this office.

## 2020-07-03 NOTE — Progress Notes (Signed)
left

## 2020-07-09 ENCOUNTER — Other Ambulatory Visit: Payer: Self-pay

## 2020-07-09 ENCOUNTER — Encounter: Payer: Self-pay | Admitting: Emergency Medicine

## 2020-07-09 ENCOUNTER — Ambulatory Visit (INDEPENDENT_AMBULATORY_CARE_PROVIDER_SITE_OTHER): Payer: Medicare Other | Admitting: Emergency Medicine

## 2020-07-09 VITALS — BP 160/90 | HR 68 | Temp 97.8°F | Ht 60.0 in | Wt 241.4 lb

## 2020-07-09 DIAGNOSIS — N1832 Chronic kidney disease, stage 3b: Secondary | ICD-10-CM

## 2020-07-09 DIAGNOSIS — R7303 Prediabetes: Secondary | ICD-10-CM | POA: Diagnosis not present

## 2020-07-09 DIAGNOSIS — M109 Gout, unspecified: Secondary | ICD-10-CM

## 2020-07-09 DIAGNOSIS — Z6841 Body Mass Index (BMI) 40.0 and over, adult: Secondary | ICD-10-CM

## 2020-07-09 DIAGNOSIS — I10 Essential (primary) hypertension: Secondary | ICD-10-CM

## 2020-07-09 NOTE — Patient Instructions (Addendum)
If you have lab work done today you will be contacted with your lab results within the next 2 weeks.  If you have not heard from [place] then please contact us. The fastest way to get your results is to register for My Chart.   IF you received an x-ray today, you will receive an invoice from [facility]. Please contact [facility] at [phone] with questions or concerns regarding your invoice.   IF you received labwork today, you will receive an invoice from [place] [facility]. Please contact [facility] at [phone] with questions or concerns regarding your invoice.   Our billing staff will not be able to assist you with questions regarding bills from these companies.  You will be contacted with the lab results as soon as they are available. The fastest way to get your results is to activate your My Chart account. Instructions are located on the last page of this paperwork. If you have not heard from [place] regarding the results in 2 weeks, please contact this office.     Gout  Gout is painful swelling of your joints. Gout is a type of arthritis. It is caused by having too much uric acid in your body. Uric acid is a chemical that is made when your body breaks down substances called purines. If your body has too much uric acid, sharp crystals can form and build up in your joints. This causes pain and swelling. Gout attacks can happen quickly and be very painful (acute gout). Over time, the attacks can affect more joints and happen more often (chronic gout). What are the causes?  Too much uric acid in your blood. This can happen because: ? Your kidneys do not remove enough uric acid from your blood. ? Your body makes too much uric acid. ? You eat too many foods that are high in purines. These foods include organ meats, some seafood, and beer.  Trauma or stress. What increases the risk?  Having a family history of gout.  Being female and middle-aged.  Being female and having gone  through menopause.  Being very overweight (obese).  Drinking alcohol, especially beer.  Not having enough water in the body (being dehydrated).  Losing weight too quickly.  Having an organ transplant.  Having lead poisoning.  Taking certain medicines.  Having kidney disease.  Having a skin condition called psoriasis. What are the signs or symptoms? An attack of acute gout usually happens in just one joint. The most common place is the big toe. Attacks often start at night. Other joints that may be affected include joints of the feet, ankle, knee, fingers, wrist, or elbow. Symptoms of an attack may include:  Very bad pain.  Warmth.  Swelling.  Stiffness.  Shiny, red, or purple skin.  Tenderness. The affected joint may be very painful to touch.  Chills and fever. Chronic gout may cause symptoms more often. More joints may be involved. You may also have white or yellow lumps (tophi) on your hands or feet or in other areas near your joints. How is this treated?  Treatment for this condition has two phases: treating an acute attack and preventing future attacks.  Acute gout treatment may include: ? NSAIDs. ? Steroids. These are taken by mouth or injected into a joint. ? Colchicine. This medicine relieves pain and swelling. It can be given by mouth or through an IV tube.  Preventive treatment may include: ? Taking small doses of NSAIDs or colchicine daily. ? Using a  medicine that reduces uric acid levels in your blood. ? Making changes to your diet. You may need to see a food expert (dietitian) about what to eat and drink to prevent gout. Follow these instructions at home: During a gout attack   If told, put ice on the painful area: ? Put ice in a plastic bag. ? Place a towel between your skin and the bag. ? Leave the ice on for 20 minutes, 2-3 times a day.  Raise (elevate) the painful joint above the level of your heart as often as you can.  Rest the joint as  much as possible. If the joint is in your leg, you may be given crutches.  Follow instructions from your doctor about what you cannot eat or drink. Avoiding future gout attacks  Eat a low-purine diet. Avoid foods and drinks such as: ? Liver. ? Kidney. ? Anchovies. ? Asparagus. ? Herring. ? Mushrooms. ? Mussels. ? Beer.  Stay at a healthy weight. If you want to lose weight, talk with your doctor. Do not lose weight too fast.  Start or continue an exercise plan as told by your doctor. Eating and drinking  Drink enough fluids to keep your pee (urine) pale yellow.  If you drink alcohol: ? Limit how much you use to:  0-1 drink a day for women.  0-2 drinks a day for men. ? Be aware of how much alcohol is in your drink. In the [place] one drink equals one 12 oz bottle of beer (355 mL), one 5 oz glass of wine (148 mL), or one 1 oz glass of hard liquor (44 mL). General instructions  Take over-the-counter and prescription medicines only as told by your doctor.  Do not drive or use heavy machinery while taking prescription pain medicine.  Return to your normal activities as told by your doctor. Ask your doctor what activities are safe for you.  Keep all follow-up visits as told by your doctor. This is important. Contact a doctor if:  You have another gout attack.  You still have symptoms of a gout attack after 10 days of treatment.  You have problems (side effects) because of your medicines.  You have chills or a fever.  You have burning pain when you pee (urinate).  You have pain in your lower back or belly. Get help right away if:  You have very bad pain.  Your pain cannot be controlled.  You cannot pee. Summary  Gout is painful swelling of the joints.  The most common site of pain is the big toe, but it can affect other joints.  Medicines and avoiding some foods can help to prevent and treat gout attacks. This information is not intended to replace advice given  to you by your health care provider. Make sure you discuss any questions you have with your health care provider. Document Revised: 03/03/2018 Document Reviewed: 03/03/2018 Elsevier Patient Education  [place].  Hypertension, Adult High blood pressure (hypertension) is when the force of blood pumping through the arteries is too strong. The arteries are the blood vessels that carry blood from the heart throughout the body. Hypertension forces the heart to work harder to pump blood and may cause arteries to become narrow or stiff. Untreated or uncontrolled hypertension can cause a heart attack, heart failure, a stroke, kidney disease, and other problems. A blood pressure reading consists of a higher number over a lower number. Ideally, your blood pressure should be below 120/80. The first ("top")  number is called the systolic pressure. It is a measure of the pressure in your arteries as your heart beats. The second ("bottom") number is called the diastolic pressure. It is a measure of the pressure in your arteries as the heart relaxes. What are the causes? The exact cause of this condition is not known. There are some conditions that result in or are related to high blood pressure. What increases the risk? Some risk factors for high blood pressure are under your control. The following factors may make you more likely to develop this condition:  Smoking.  Having type 2 diabetes mellitus, high cholesterol, or both.  Not getting enough exercise or physical activity.  Being overweight.  Having too much fat, sugar, calories, or salt (sodium) in your diet.  Drinking too much alcohol. Some risk factors for high blood pressure may be difficult or impossible to change. Some of these factors include:  Having chronic kidney disease.  Having a family history of high blood pressure.  Age. Risk increases with age.  Race. You may be at higher risk if you are African American.  Gender. Men  are at higher risk than women before age 75. After age 66, women are at higher risk than men.  Having obstructive sleep apnea.  Stress. What are the signs or symptoms? High blood pressure may not cause symptoms. Very high blood pressure (hypertensive crisis) may cause:  Headache.  Anxiety.  Shortness of breath.  Nosebleed.  Nausea and vomiting.  Vision changes.  Severe chest pain.  Seizures. How is this diagnosed? This condition is diagnosed by measuring your blood pressure while you are seated, with your arm resting on a flat surface, your legs uncrossed, and your feet flat on the floor. The cuff of the blood pressure monitor will be placed directly against the skin of your upper arm at the level of your heart. It should be measured at least twice using the same arm. Certain conditions can cause a difference in blood pressure between your right and left arms. Certain factors can cause blood pressure readings to be lower or higher than normal for a short period of time:  When your blood pressure is higher when you are in a health care provider's office than when you are at home, this is called white coat hypertension. Most people with this condition do not need medicines.  When your blood pressure is higher at home than when you are in a health care provider's office, this is called masked hypertension. Most people with this condition may need medicines to control blood pressure. If you have a high blood pressure reading during one visit or you have normal blood pressure with other risk factors, you may be asked to:  Return on a different day to have your blood pressure checked again.  Monitor your blood pressure at home for 1 week or longer. If you are diagnosed with hypertension, you may have other blood or imaging tests to help your health care provider understand your overall risk for other conditions. How is this treated? This condition is treated by making healthy lifestyle  changes, such as eating healthy foods, exercising more, and reducing your alcohol intake. Your health care provider may prescribe medicine if lifestyle changes are not enough to get your blood pressure under control, and if:  Your systolic blood pressure is above 130.  Your diastolic blood pressure is above 80. Your personal target blood pressure may vary depending on your medical conditions, your age, and other  factors. Follow these instructions at home: Eating and drinking   Eat a diet that is high in fiber and potassium, and low in sodium, added sugar, and fat. An example eating plan is called the DASH (Dietary Approaches to Stop Hypertension) diet. To eat this way: ? Eat plenty of fresh fruits and vegetables. Try to fill one half of your plate at each meal with fruits and vegetables. ? Eat whole grains, such as whole-wheat pasta, brown rice, or whole-grain bread. Fill about one fourth of your plate with whole grains. ? Eat or drink low-fat dairy products, such as skim milk or low-fat yogurt. ? Avoid fatty cuts of meat, processed or cured meats, and poultry with skin. Fill about one fourth of your plate with lean proteins, such as fish, chicken without skin, beans, eggs, or tofu. ? Avoid pre-made and processed foods. These tend to be higher in sodium, added sugar, and fat.  Reduce your daily sodium intake. Most people with hypertension should eat less than 1,500 mg of sodium a day.  Do not drink alcohol if: ? Your health care provider tells you not to drink. ? You are pregnant, may be pregnant, or are planning to become pregnant.  If you drink alcohol: ? Limit how much you use to:  0-1 drink a day for women.  0-2 drinks a day for men. ? Be aware of how much alcohol is in your drink. In the [place] one drink equals one 12 oz bottle of beer (355 mL), one 5 oz glass of wine (148 mL), or one 1 oz glass of hard liquor (44 mL). Lifestyle   Work with your health care provider to maintain  a healthy body weight or to lose weight. Ask what an ideal weight is for you.  Get at least 30 minutes of exercise most days of the week. Activities may include walking, swimming, or biking.  Include exercise to strengthen your muscles (resistance exercise), such as Pilates or lifting weights, as part of your weekly exercise routine. Try to do these types of exercises for 30 minutes at least 3 days a week.  Do not use any products that contain nicotine or tobacco, such as cigarettes, e-cigarettes, and chewing tobacco. If you need help quitting, ask your health care provider.  Monitor your blood pressure at home as told by your health care provider.  Keep all follow-up visits as told by your health care provider. This is important. Medicines  Take over-the-counter and prescription medicines only as told by your health care provider. Follow directions carefully. Blood pressure medicines must be taken as prescribed.  Do not skip doses of blood pressure medicine. Doing this puts you at risk for problems and can make the medicine less effective.  Ask your health care provider about side effects or reactions to medicines that you should watch for. Contact a health care provider if you:  Think you are having a reaction to a medicine you are taking.  Have headaches that keep coming back (recurring).  Feel dizzy.  Have swelling in your ankles.  Have trouble with your vision. Get help right away if you:  Develop a severe headache or confusion.  Have unusual weakness or numbness.  Feel faint.  Have severe pain in your chest or abdomen.  Vomit repeatedly.  Have trouble breathing. Summary  Hypertension is when the force of blood pumping through your arteries is too strong. If this condition is not controlled, it may put you at risk for serious complications.  Your personal target blood pressure may vary depending on your medical conditions, your age, and other factors. For most  people, a normal blood pressure is less than 120/80.  Hypertension is treated with lifestyle changes, medicines, or a combination of both. Lifestyle changes include losing weight, eating a healthy, low-sodium diet, exercising more, and limiting alcohol. This information is not intended to replace advice given to you by your health care provider. Make sure you discuss any questions you have with your health care provider. Document Revised: 04/21/2018 Document Reviewed: 04/21/2018 Elsevier Patient Education  2020 [employer].

## 2020-07-09 NOTE — Progress Notes (Signed)
Miranda [name] 74 y.o.   Chief Complaint  Patient presents with  . Gout    flare in left foot great toe  . Hypertension    did not take meds last night    HISTORY OF PRESENT ILLNESS: This is a 74 y.o. female with history of gout and recent flareup here for follow-up.  Doing much better. Was seen by my colleagues Dr. Linna Darner and Maximiano Coss, NP.  Was given prednisone and tramadol.  Was also started on allopurinol 50 mg daily. Lab Results  Component Value Date   CREATININE 1.42 (H) 06/15/2020   BUN 30 (H) 06/15/2020   NA 141 06/15/2020   K 3.9 06/15/2020   CL 102 06/15/2020   CO2 26 06/15/2020  History of hypertension.  Did not take blood pressure medication last night as usual. Has no other complaints or medical concerns today. Fully vaccinated against Covid.   HPI   Prior to Admission medications   Medication Sig Start Date End Date Taking? Authorizing Provider  albuterol (VENTOLIN HFA) 108 (90 Base) MCG/ACT inhaler Inhale 2 puffs into the lungs every 4 (four) hours as needed for wheezing or shortness of breath (cough, shortness of breath or wheezing.). 01/09/20  Yes Rhiannon Sassaman, Ines Bloomer, MD  allopurinol (ZYLOPRIM) 100 MG tablet Take 0.5 tablets (50 mg total) by mouth daily. 07/03/20  Yes Maximiano Coss, NP  atenolol-chlorthalidone (TENORETIC) 50-25 MG tablet TAKE 1 TABLET BY MOUTH DAILY 02/17/20  Yes Horald Pollen, MD  lisinopril (ZESTRIL) 40 MG tablet TAKE 1 TABLET BY MOUTH DAILY 05/06/20  Yes Delvin Hedeen, Ines Bloomer, MD  meclizine (ANTIVERT) 25 MG tablet Take 1 tablet (25 mg total) by mouth 3 (three) times daily as needed for dizziness. 01/09/20  Yes Nashla Althoff, Ines Bloomer, MD  traMADol (ULTRAM) 50 MG tablet Take 1 tablet (50 mg total) by mouth 2 (two) times daily as needed for up to 8 days. 07/03/20 07/11/20 Yes Maximiano Coss, NP    No Known Allergies  Patient Active Problem List   Diagnosis Date Noted  . Morbid obesity ([place]) 07/09/2020  . Environmental allergies  03/27/2018  . BMI 45.0-49.9, adult ([place]) 03/14/2016  . Pre-diabetes 02/28/2015  . HTN (hypertension) 01/17/2012    Past Medical History:  Diagnosis Date  . Allergy   . Arthritis   . Hypertension     Past Surgical History:  Procedure Laterality Date  . ABDOMINAL HYSTERECTOMY    . breast biopsy Left    benign  . CHOLECYSTECTOMY      Social History   Socioeconomic History  . Marital status: Married    Spouse name: Sheppard Coil  . Number of children: 2  . Years of education: HS  . Highest education level: Not on file  Occupational History  . Occupation: Deli Counter    Comment: [employer]  Tobacco Use  . Smoking status: Never Smoker  . Smokeless tobacco: Never Used  Vaping Use  . Vaping Use: Never used  Substance and Sexual Activity  . Alcohol use: Yes    Alcohol/week: 0.0 standard drinks    Comment: very seldom  . Drug use: No  . Sexual activity: Yes  Other Topics Concern  . Not on file  Social History Narrative   1-2 sodas a day, daily consumption of tea   Lives with her husband and and their youngest daughter.   Social Determinants of Health   Financial Resource Strain:   . Difficulty of Paying Living Expenses: Not on file  Food Insecurity:   .  Worried About Charity fundraiser in the Last Year: Not on file  . Ran Out of Food in the Last Year: Not on file  Transportation Needs:   . Lack of Transportation (Medical): Not on file  . Lack of Transportation (Non-Medical): Not on file  Physical Activity:   . Days of Exercise per Week: Not on file  . Minutes of Exercise per Session: Not on file  Stress:   . Feeling of Stress : Not on file  Social Connections:   . Frequency of Communication with Friends and Family: Not on file  . Frequency of Social Gatherings with Friends and Family: Not on file  . Attends Religious Services: Not on file  . Active Member of Clubs or Organizations: Not on file  . Attends Archivist Meetings: Not on file  . Marital  Status: Not on file  Intimate Partner Violence:   . Fear of Current or Ex-Partner: Not on file  . Emotionally Abused: Not on file  . Physically Abused: Not on file  . Sexually Abused: Not on file    Family History  Problem Relation Age of Onset  . Stroke Brother   . Alzheimer's disease Brother   . Asthma Daughter   . Diabetes Sister   . Heart disease Sister   . Mental illness Sister        depression, anxiety, resolved with treatment  . Graves' disease Daughter   . Colon cancer Neg Hx   . Esophageal cancer Neg Hx   . Rectal cancer Neg Hx   . Stomach cancer Neg Hx      Review of Systems  Constitutional: Negative.  Negative for chills and fever.  HENT: Negative.  Negative for congestion and sore throat.   Respiratory: Negative.  Negative for cough and shortness of breath.   Cardiovascular: Negative.  Negative for chest pain and palpitations.  Gastrointestinal: Negative.  Negative for abdominal pain, diarrhea, nausea and vomiting.  Genitourinary: Negative.  Negative for dysuria and hematuria.  Musculoskeletal: Negative.   Skin: Negative.  Negative for rash.  Neurological: Negative.  Negative for dizziness and headaches.  All other systems reviewed and are negative.   Today's Vitals   07/09/20 0856  BP: (!) 160/90  Pulse: 68  Temp: 97.8 F (36.6 C)  SpO2: 98%  Weight: 241 lb 6.4 oz (109.5 kg)  Height: 5' (1.524 m)   Body mass index is 47.15 kg/m. BP Readings from Last 3 Encounters:  07/09/20 (!) 160/90  07/03/20 (!) 150/80  06/15/20 138/84    Physical Exam Vitals reviewed.  Constitutional:      Appearance: Normal appearance. She is obese.  HENT:     Head: Normocephalic.  Eyes:     Extraocular Movements: Extraocular movements intact.     Pupils: Pupils are equal, round, and reactive to light.  Cardiovascular:     Rate and Rhythm: Normal rate and regular rhythm.     Pulses: Normal pulses.     Heart sounds: Normal heart sounds.  Pulmonary:     Effort:  Pulmonary effort is normal.     Breath sounds: Normal breath sounds.  Musculoskeletal:     Cervical back: Normal range of motion and neck supple.     Comments: Left great toe: No erythema, swelling, or significant tenderness.  Full range of motion.  Neurovascularly intact.  Skin:    General: Skin is warm and dry.     Capillary Refill: Capillary refill takes less than 2 seconds.  Neurological:     General: No focal deficit present.     Mental Status: She is alert and oriented to person, place, and time.  Psychiatric:        Mood and Affect: Mood normal.        Behavior: Behavior normal.    A total of 30 minutes was spent with the patient, greater than 50% of which was in counseling/coordination of care regarding hypertension and cardiovascular risks associated with this condition, gout and treatment, review of all medications, review of most recent office visit notes, review of most recent blood work results that show chronic kidney disease stage IIIb, education on nutrition, health maintenance items, documentation, prognosis, and need for follow-up.   ASSESSMENT & PLAN: Gout much improved. Elevated blood pressure reading due to not taking medication as scheduled last night or today. Continue present medications.  No changes. Diet and nutrition discussed. Follow-up in 6 months.  Letishia was seen today for gout and hypertension.  Diagnoses and all orders for this visit:  Gouty arthritis of great toe  Pre-diabetes  Essential hypertension, benign  Morbid obesity ([place])  Stage 3b chronic kidney disease ([place])    Patient Instructions       If you have lab work done today you will be contacted with your lab results within the next 2 weeks.  If you have not heard from [place] then please contact us. The fastest way to get your results is to register for My Chart.   IF you received an x-ray today, you will receive an invoice from [facility]. Please contact [facility]  Radiology at [phone] with questions or concerns regarding your invoice.   IF you received labwork today, you will receive an invoice from [place]. Please contact [facility] at [phone] with questions or concerns regarding your invoice.   Our billing staff will not be able to assist you with questions regarding bills from these companies.  You will be contacted with the lab results as soon as they are available. The fastest way to get your results is to activate your My Chart account. Instructions are located on the last page of this paperwork. If you have not heard from [place] regarding the results in 2 weeks, please contact this office.     Gout  Gout is painful swelling of your joints. Gout is a type of arthritis. It is caused by having too much uric acid in your body. Uric acid is a chemical that is made when your body breaks down substances called purines. If your body has too much uric acid, sharp crystals can form and build up in your joints. This causes pain and swelling. Gout attacks can happen quickly and be very painful (acute gout). Over time, the attacks can affect more joints and happen more often (chronic gout). What are the causes?  Too much uric acid in your blood. This can happen because: ? Your kidneys do not remove enough uric acid from your blood. ? Your body makes too much uric acid. ? You eat too many foods that are high in purines. These foods include organ meats, some seafood, and beer.  Trauma or stress. What increases the risk?  Having a family history of gout.  Being female and middle-aged.  Being female and having gone through menopause.  Being very overweight (obese).  Drinking alcohol, especially beer.  Not having enough water in the body (being dehydrated).  Losing weight too quickly.  Having an organ transplant.  Having lead poisoning.  Taking certain medicines.  Having kidney disease.  Having a skin condition called psoriasis. What are  the signs or symptoms? An attack of acute gout usually happens in just one joint. The most common place is the big toe. Attacks often start at night. Other joints that may be affected include joints of the feet, ankle, knee, fingers, wrist, or elbow. Symptoms of an attack may include:  Very bad pain.  Warmth.  Swelling.  Stiffness.  Shiny, red, or purple skin.  Tenderness. The affected joint may be very painful to touch.  Chills and fever. Chronic gout may cause symptoms more often. More joints may be involved. You may also have white or yellow lumps (tophi) on your hands or feet or in other areas near your joints. How is this treated?  Treatment for this condition has two phases: treating an acute attack and preventing future attacks.  Acute gout treatment may include: ? NSAIDs. ? Steroids. These are taken by mouth or injected into a joint. ? Colchicine. This medicine relieves pain and swelling. It can be given by mouth or through an IV tube.  Preventive treatment may include: ? Taking small doses of NSAIDs or colchicine daily. ? Using a medicine that reduces uric acid levels in your blood. ? Making changes to your diet. You may need to see a food expert (dietitian) about what to eat and drink to prevent gout. Follow these instructions at home: During a gout attack   If told, put ice on the painful area: ? Put ice in a plastic bag. ? Place a towel between your skin and the bag. ? Leave the ice on for 20 minutes, 2-3 times a day.  Raise (elevate) the painful joint above the level of your heart as often as you can.  Rest the joint as much as possible. If the joint is in your leg, you may be given crutches.  Follow instructions from your doctor about what you cannot eat or drink. Avoiding future gout attacks  Eat a low-purine diet. Avoid foods and drinks such as: ? Liver. ? Kidney. ? Anchovies. ? Asparagus. ? Herring. ? Mushrooms. ? Mussels. ? Beer.  Stay at a  healthy weight. If you want to lose weight, talk with your doctor. Do not lose weight too fast.  Start or continue an exercise plan as told by your doctor. Eating and drinking  Drink enough fluids to keep your pee (urine) pale yellow.  If you drink alcohol: ? Limit how much you use to:  0-1 drink a day for women.  0-2 drinks a day for men. ? Be aware of how much alcohol is in your drink. In the [place] one drink equals one 12 oz bottle of beer (355 mL), one 5 oz glass of wine (148 mL), or one 1 oz glass of hard liquor (44 mL). General instructions  Take over-the-counter and prescription medicines only as told by your doctor.  Do not drive or use heavy machinery while taking prescription pain medicine.  Return to your normal activities as told by your doctor. Ask your doctor what activities are safe for you.  Keep all follow-up visits as told by your doctor. This is important. Contact a doctor if:  You have another gout attack.  You still have symptoms of a gout attack after 10 days of treatment.  You have problems (side effects) because of your medicines.  You have chills or a fever.  You have burning pain when you pee (urinate).  You  have pain in your lower back or belly. Get help right away if:  You have very bad pain.  Your pain cannot be controlled.  You cannot pee. Summary  Gout is painful swelling of the joints.  The most common site of pain is the big toe, but it can affect other joints.  Medicines and avoiding some foods can help to prevent and treat gout attacks. This information is not intended to replace advice given to you by your health care provider. Make sure you discuss any questions you have with your health care provider. Document Revised: 03/03/2018 Document Reviewed: 03/03/2018 Elsevier Patient Education  [place].  Hypertension, Adult High blood pressure (hypertension) is when the force of blood pumping through the arteries is too  strong. The arteries are the blood vessels that carry blood from the heart throughout the body. Hypertension forces the heart to work harder to pump blood and may cause arteries to become narrow or stiff. Untreated or uncontrolled hypertension can cause a heart attack, heart failure, a stroke, kidney disease, and other problems. A blood pressure reading consists of a higher number over a lower number. Ideally, your blood pressure should be below 120/80. The first ("top") number is called the systolic pressure. It is a measure of the pressure in your arteries as your heart beats. The second ("bottom") number is called the diastolic pressure. It is a measure of the pressure in your arteries as the heart relaxes. What are the causes? The exact cause of this condition is not known. There are some conditions that result in or are related to high blood pressure. What increases the risk? Some risk factors for high blood pressure are under your control. The following factors may make you more likely to develop this condition:  Smoking.  Having type 2 diabetes mellitus, high cholesterol, or both.  Not getting enough exercise or physical activity.  Being overweight.  Having too much fat, sugar, calories, or salt (sodium) in your diet.  Drinking too much alcohol. Some risk factors for high blood pressure may be difficult or impossible to change. Some of these factors include:  Having chronic kidney disease.  Having a family history of high blood pressure.  Age. Risk increases with age.  Race. You may be at higher risk if you are African American.  Gender. Men are at higher risk than women before age 14. After age 29, women are at higher risk than men.  Having obstructive sleep apnea.  Stress. What are the signs or symptoms? High blood pressure may not cause symptoms. Very high blood pressure (hypertensive crisis) may cause:  Headache.  Anxiety.  Shortness of  breath.  Nosebleed.  Nausea and vomiting.  Vision changes.  Severe chest pain.  Seizures. How is this diagnosed? This condition is diagnosed by measuring your blood pressure while you are seated, with your arm resting on a flat surface, your legs uncrossed, and your feet flat on the floor. The cuff of the blood pressure monitor will be placed directly against the skin of your upper arm at the level of your heart. It should be measured at least twice using the same arm. Certain conditions can cause a difference in blood pressure between your right and left arms. Certain factors can cause blood pressure readings to be lower or higher than normal for a short period of time:  When your blood pressure is higher when you are in a health care provider's office than when you are at home, this  is called white coat hypertension. Most people with this condition do not need medicines.  When your blood pressure is higher at home than when you are in a health care provider's office, this is called masked hypertension. Most people with this condition may need medicines to control blood pressure. If you have a high blood pressure reading during one visit or you have normal blood pressure with other risk factors, you may be asked to:  Return on a different day to have your blood pressure checked again.  Monitor your blood pressure at home for 1 week or longer. If you are diagnosed with hypertension, you may have other blood or imaging tests to help your health care provider understand your overall risk for other conditions. How is this treated? This condition is treated by making healthy lifestyle changes, such as eating healthy foods, exercising more, and reducing your alcohol intake. Your health care provider may prescribe medicine if lifestyle changes are not enough to get your blood pressure under control, and if:  Your systolic blood pressure is above 130.  Your diastolic blood pressure is above  80. Your personal target blood pressure may vary depending on your medical conditions, your age, and other factors. Follow these instructions at home: Eating and drinking   Eat a diet that is high in fiber and potassium, and low in sodium, added sugar, and fat. An example eating plan is called the DASH (Dietary Approaches to Stop Hypertension) diet. To eat this way: ? Eat plenty of fresh fruits and vegetables. Try to fill one half of your plate at each meal with fruits and vegetables. ? Eat whole grains, such as whole-wheat pasta, brown rice, or whole-grain bread. Fill about one fourth of your plate with whole grains. ? Eat or drink low-fat dairy products, such as skim milk or low-fat yogurt. ? Avoid fatty cuts of meat, processed or cured meats, and poultry with skin. Fill about one fourth of your plate with lean proteins, such as fish, chicken without skin, beans, eggs, or tofu. ? Avoid pre-made and processed foods. These tend to be higher in sodium, added sugar, and fat.  Reduce your daily sodium intake. Most people with hypertension should eat less than 1,500 mg of sodium a day.  Do not drink alcohol if: ? Your health care provider tells you not to drink. ? You are pregnant, may be pregnant, or are planning to become pregnant.  If you drink alcohol: ? Limit how much you use to:  0-1 drink a day for women.  0-2 drinks a day for men. ? Be aware of how much alcohol is in your drink. In the [place] one drink equals one 12 oz bottle of beer (355 mL), one 5 oz glass of wine (148 mL), or one 1 oz glass of hard liquor (44 mL). Lifestyle   Work with your health care provider to maintain a healthy body weight or to lose weight. Ask what an ideal weight is for you.  Get at least 30 minutes of exercise most days of the week. Activities may include walking, swimming, or biking.  Include exercise to strengthen your muscles (resistance exercise), such as Pilates or lifting weights, as part of  your weekly exercise routine. Try to do these types of exercises for 30 minutes at least 3 days a week.  Do not use any products that contain nicotine or tobacco, such as cigarettes, e-cigarettes, and chewing tobacco. If you need help quitting, ask your health care provider.  Monitor  your blood pressure at home as told by your health care provider.  Keep all follow-up visits as told by your health care provider. This is important. Medicines  Take over-the-counter and prescription medicines only as told by your health care provider. Follow directions carefully. Blood pressure medicines must be taken as prescribed.  Do not skip doses of blood pressure medicine. Doing this puts you at risk for problems and can make the medicine less effective.  Ask your health care provider about side effects or reactions to medicines that you should watch for. Contact a health care provider if you:  Think you are having a reaction to a medicine you are taking.  Have headaches that keep coming back (recurring).  Feel dizzy.  Have swelling in your ankles.  Have trouble with your vision. Get help right away if you:  Develop a severe headache or confusion.  Have unusual weakness or numbness.  Feel faint.  Have severe pain in your chest or abdomen.  Vomit repeatedly.  Have trouble breathing. Summary  Hypertension is when the force of blood pumping through your arteries is too strong. If this condition is not controlled, it may put you at risk for serious complications.  Your personal target blood pressure may vary depending on your medical conditions, your age, and other factors. For most people, a normal blood pressure is less than 120/80.  Hypertension is treated with lifestyle changes, medicines, or a combination of both. Lifestyle changes include losing weight, eating a healthy, low-sodium diet, exercising more, and limiting alcohol. This information is not intended to replace advice given  to you by your health care provider. Make sure you discuss any questions you have with your health care provider. Document Revised: 04/21/2018 Document Reviewed: 04/21/2018 Elsevier Patient Education  2020 Elsevier Inc.      Agustina Caroli, MD [facility]

## 2020-07-31 DIAGNOSIS — G4733 Obstructive sleep apnea (adult) (pediatric): Secondary | ICD-10-CM | POA: Diagnosis not present

## 2020-10-30 ENCOUNTER — Other Ambulatory Visit: Payer: Self-pay | Admitting: Emergency Medicine

## 2020-10-30 DIAGNOSIS — I10 Essential (primary) hypertension: Secondary | ICD-10-CM

## 2020-12-15 IMAGING — DX DG TOE GREAT 2+V*L*
3 series · 3 of 3 positions shown · non-contrast
Comparison: None.

CLINICAL DATA: Pain and swelling great toe

EXAM:
LEFT GREAT TOE

[toe ap]
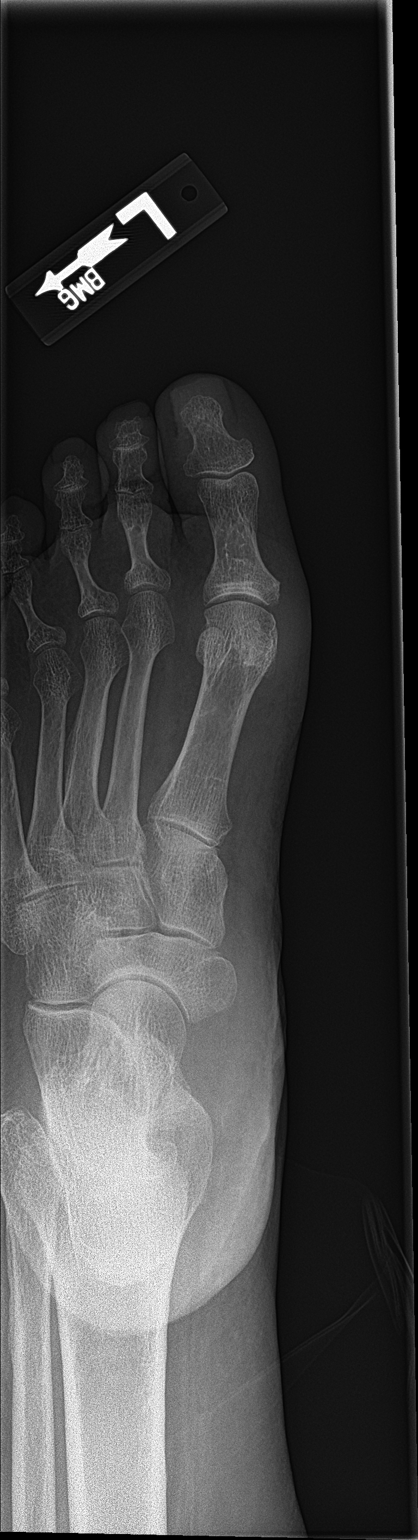

[toe obl]
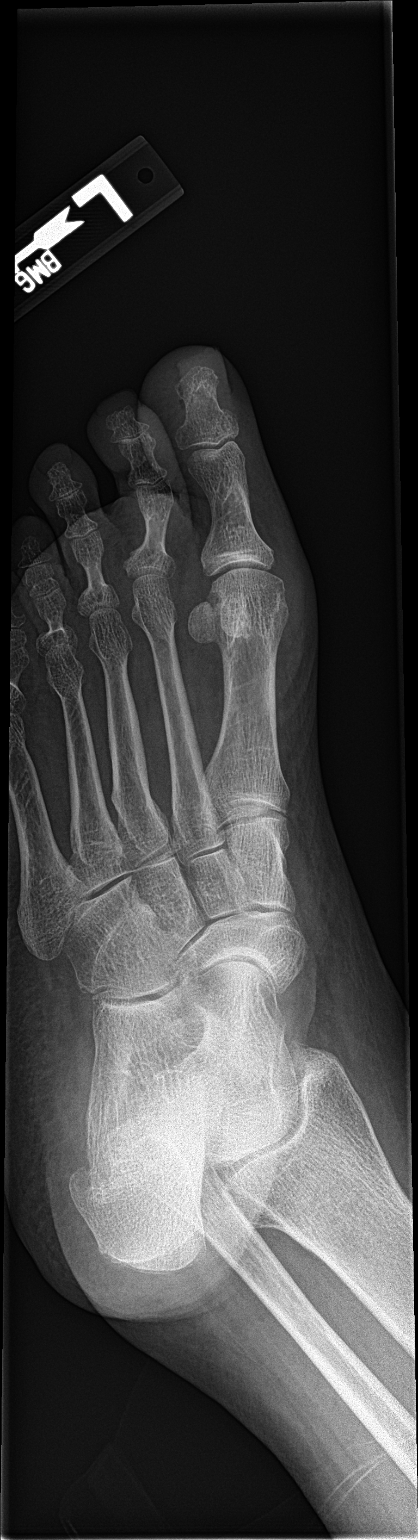

[toe lat]
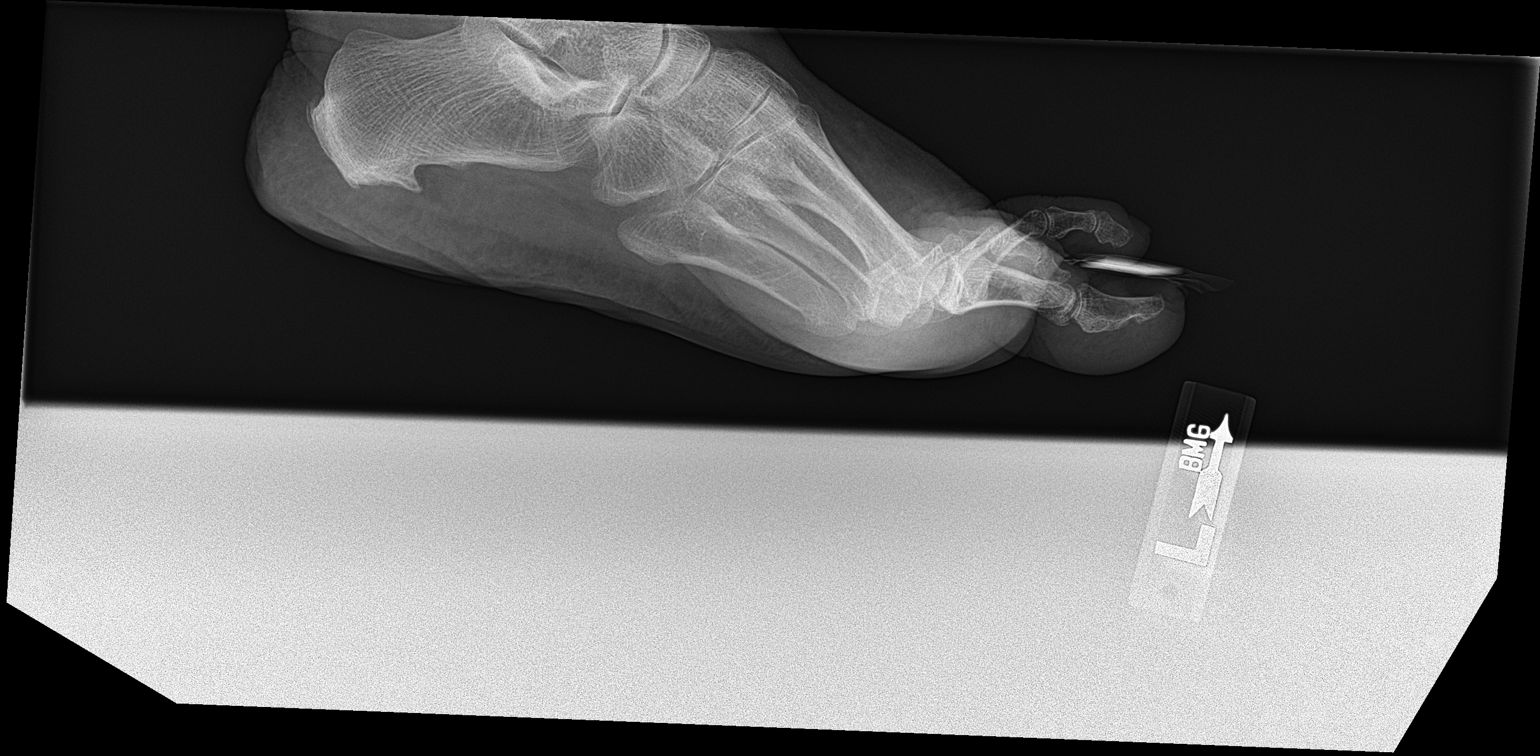

[3 of 3 positions shown; findings below may reference images not displayed]

FINDINGS: Normal alignment no fracture. No significant arthropathy. Mild soft
tissue edema medial to the first metatarsal phalangeal joint.
IMPRESSION: Mild soft tissue swelling medial to the first metatarsal phalangeal
joint.

## 2021-01-02 ENCOUNTER — Encounter: Payer: Self-pay | Admitting: Emergency Medicine

## 2021-01-09 ENCOUNTER — Ambulatory Visit: Payer: Self-pay | Admitting: Emergency Medicine

## 2021-01-22 ENCOUNTER — Other Ambulatory Visit: Payer: Self-pay | Admitting: Emergency Medicine

## 2021-01-22 DIAGNOSIS — R42 Dizziness and giddiness: Secondary | ICD-10-CM

## 2021-01-28 ENCOUNTER — Other Ambulatory Visit: Payer: Self-pay | Admitting: Emergency Medicine

## 2021-01-28 DIAGNOSIS — I10 Essential (primary) hypertension: Secondary | ICD-10-CM

## 2021-04-28 ENCOUNTER — Other Ambulatory Visit: Payer: Self-pay | Admitting: Emergency Medicine

## 2021-04-28 DIAGNOSIS — I10 Essential (primary) hypertension: Secondary | ICD-10-CM

## 2021-07-02 ENCOUNTER — Other Ambulatory Visit: Payer: Self-pay | Admitting: Registered Nurse

## 2021-07-02 DIAGNOSIS — M109 Gout, unspecified: Secondary | ICD-10-CM

## 2021-10-14 ENCOUNTER — Encounter: Payer: Self-pay | Admitting: Emergency Medicine

## 2021-10-14 ENCOUNTER — Ambulatory Visit: Payer: Medicare PPO | Admitting: Emergency Medicine

## 2021-10-14 ENCOUNTER — Other Ambulatory Visit: Payer: Self-pay

## 2021-10-14 VITALS — BP 132/82 | HR 53 | Temp 98.2°F | Ht 60.0 in | Wt 238.0 lb

## 2021-10-14 DIAGNOSIS — J4520 Mild intermittent asthma, uncomplicated: Secondary | ICD-10-CM | POA: Diagnosis not present

## 2021-10-14 DIAGNOSIS — I10 Essential (primary) hypertension: Secondary | ICD-10-CM

## 2021-10-14 DIAGNOSIS — R7303 Prediabetes: Secondary | ICD-10-CM

## 2021-10-14 DIAGNOSIS — Z23 Encounter for immunization: Secondary | ICD-10-CM | POA: Diagnosis not present

## 2021-10-14 DIAGNOSIS — N1832 Chronic kidney disease, stage 3b: Secondary | ICD-10-CM | POA: Diagnosis not present

## 2021-10-14 DIAGNOSIS — Z8739 Personal history of other diseases of the musculoskeletal system and connective tissue: Secondary | ICD-10-CM

## 2021-10-14 LAB — COMPREHENSIVE METABOLIC PANEL
ALT: 9 U/L (ref 0–35)
AST: 16 U/L (ref 0–37)
Albumin: 3.9 g/dL (ref 3.5–5.2)
Alkaline Phosphatase: 111 U/L (ref 39–117)
BUN: 23 mg/dL (ref 6–23)
CO2: 34 mEq/L — ABNORMAL HIGH (ref 19–32)
Calcium: 9.9 mg/dL (ref 8.4–10.5)
Chloride: 102 mEq/L (ref 96–112)
Creatinine, Ser: 1.34 mg/dL — ABNORMAL HIGH (ref 0.40–1.20)
GFR: 39.28 mL/min — ABNORMAL LOW (ref >60.00)
Glucose, Bld: 106 mg/dL — ABNORMAL HIGH (ref 70–99)
Potassium: 3.9 mEq/L (ref 3.5–5.1)
Sodium: 140 mEq/L (ref 135–145)
Total Bilirubin: 0.3 mg/dL (ref 0.2–1.2)
Total Protein: 7.6 g/dL (ref 6.0–8.3)

## 2021-10-14 LAB — CBC WITH DIFFERENTIAL/PLATELET
Basophils Absolute: 0 10*3/uL (ref 0.0–0.1)
Basophils Relative: 0.4 % (ref 0.0–3.0)
Eosinophils Absolute: 0.1 10*3/uL (ref 0.0–0.7)
Eosinophils Relative: 1.1 % (ref 0.0–5.0)
HCT: 35.8 % — ABNORMAL LOW (ref 36.0–46.0)
Hemoglobin: 11.4 g/dL — ABNORMAL LOW (ref 12.0–15.0)
Lymphocytes Relative: 26.6 % (ref 12.0–46.0)
Lymphs Abs: 2.1 10*3/uL (ref 0.7–4.0)
MCHC: 31.8 g/dL (ref 30.0–36.0)
MCV: 77.5 fl — ABNORMAL LOW (ref 78.0–100.0)
Monocytes Absolute: 0.5 10*3/uL (ref 0.1–1.0)
Monocytes Relative: 6.8 % (ref 3.0–12.0)
Neutro Abs: 5.1 10*3/uL (ref 1.4–7.7)
Neutrophils Relative %: 65.1 % (ref 43.0–77.0)
Platelets: 255 10*3/uL (ref 150.0–400.0)
RBC: 4.62 Mil/uL (ref 3.87–5.11)
RDW: 16.4 % — ABNORMAL HIGH (ref 11.5–15.5)
WBC: 7.9 10*3/uL (ref 4.0–10.5)

## 2021-10-14 LAB — HEMOGLOBIN A1C: Hgb A1c MFr Bld: 6.4 % (ref 4.6–6.5)

## 2021-10-14 LAB — LIPID PANEL
Cholesterol: 194 mg/dL (ref 0–200)
HDL: 74 mg/dL (ref >39.00)
LDL Cholesterol: 103 mg/dL — ABNORMAL HIGH (ref 0–99)
NonHDL: 120.34
Total CHOL/HDL Ratio: 3
Triglycerides: 85 mg/dL (ref 0.0–149.0)
VLDL: 17 mg/dL (ref 0.0–40.0)

## 2021-10-14 LAB — URIC ACID: Uric Acid, Serum: 7.1 mg/dL — ABNORMAL HIGH (ref 2.4–7.0)

## 2021-10-14 MED ORDER — ATENOLOL-CHLORTHALIDONE 50-25 MG PO TABS: 1.0000 | ORAL_TABLET | Freq: Every day | ORAL | 3 refills | Status: DC

## 2021-10-14 MED ORDER — ALLOPURINOL 100 MG PO TABS: 50.0000 mg | ORAL_TABLET | Freq: Every day | ORAL | 3 refills | Status: DC

## 2021-10-14 MED ORDER — LISINOPRIL 40 MG PO TABS: 40.0000 mg | ORAL_TABLET | Freq: Every day | ORAL | 3 refills | Status: DC

## 2021-10-14 MED ORDER — ALBUTEROL SULFATE HFA 108 (90 BASE) MCG/ACT IN AERS: 2.0000 | INHALATION_SPRAY | RESPIRATORY_TRACT | 3 refills | Status: AC | PRN

## 2021-10-14 NOTE — Patient Instructions (Signed)

## 2021-10-14 NOTE — Assessment & Plan Note (Signed)
Diet and nutrition discussed.  Advised to stay well-hydrated and avoid NSAIDs.

## 2021-10-14 NOTE — Assessment & Plan Note (Signed)
Well-controlled hypertension. BP Readings from Last 3 Encounters:  10/14/21 132/82  07/09/20 (!) 160/90  07/03/20 (!) 150/80  Continue lisinopril 40 mg daily and Tenoretic 50-25 mg daily. Dietary approaches to stop hypertension discussed.

## 2021-10-14 NOTE — Assessment & Plan Note (Signed)
Diet and nutrition discussed.  Advised to decrease amount of daily carbohydrate intake. 

## 2021-10-14 NOTE — Progress Notes (Signed)
Miranda [name] 77 y.o.   Chief Complaint  Patient presents with   Hypertension    F/u, med refill all medications    HISTORY OF PRESENT ILLNESS: This is a 76 y.o. female with history of hypertension here for follow-up and medication refill. Presently on lisinopril 40 mg and atenolol-chlorthalidone. Has history of gout on allopurinol 50 mg daily. Has history of chronic kidney disease. History of prediabetes. Has no complaints or medical concerns today.  Hypertension Pertinent negatives include no chest pain, headaches, palpitations or shortness of breath.    Prior to Admission medications   Medication Sig Start Date End Date Taking? Authorizing Provider  meclizine (ANTIVERT) 25 MG tablet TAKE 1 TABLET(25 MG) BY MOUTH THREE TIMES DAILY AS NEEDED FOR DIZZINESS 01/22/21  Yes Colbie Sliker, Ines Bloomer, MD  albuterol (VENTOLIN HFA) 108 (90 Base) MCG/ACT inhaler Inhale 2 puffs into the lungs every 4 (four) hours as needed for wheezing or shortness of breath (cough, shortness of breath or wheezing.). 10/14/21   Horald Pollen, MD  allopurinol (ZYLOPRIM) 100 MG tablet Take 0.5 tablets (50 mg total) by mouth daily. 10/14/21 01/12/22  Horald Pollen, MD  atenolol-chlorthalidone (TENORETIC) 50-25 MG tablet Take 1 tablet by mouth daily. 10/14/21 01/12/22  Horald Pollen, MD  lisinopril (ZESTRIL) 40 MG tablet Take 1 tablet (40 mg total) by mouth daily. 10/14/21 01/12/22  Horald Pollen, MD    No Known Allergies  Patient Active Problem List   Diagnosis Date Noted   Morbid obesity ([place]) 07/09/2020   Stage 3b chronic kidney disease ([place]) 07/09/2020   Environmental allergies 03/27/2018   BMI 45.0-49.9, adult ([place]) 03/14/2016   Pre-diabetes 02/28/2015   HTN (hypertension) 01/17/2012    Past Medical History:  Diagnosis Date   Allergy    Arthritis    Hypertension     Past Surgical History:  Procedure Laterality Date   ABDOMINAL HYSTERECTOMY     breast biopsy Left     benign   CHOLECYSTECTOMY      Social History   Socioeconomic History   Marital status: Married    Spouse name: Sheppard Coil   Number of children: 2   Years of education: HS   Highest education level: Not on file  Occupational History   Occupation: Deli Counter    Comment: [employer]  Tobacco Use   Smoking status: Never   Smokeless tobacco: Never  Vaping Use   Vaping Use: Never used  Substance and Sexual Activity   Alcohol use: Yes    Alcohol/week: 0.0 standard drinks    Comment: very seldom   Drug use: No   Sexual activity: Yes  Other Topics Concern   Not on file  Social History Narrative   1-2 sodas a day, daily consumption of tea   Lives with her husband and and their youngest daughter.   Social Determinants of Health   Financial Resource Strain: Not on file  Food Insecurity: Not on file  Transportation Needs: Not on file  Physical Activity: Not on file  Stress: Not on file  Social Connections: Not on file  Intimate Partner Violence: Not on file    Family History  Problem Relation Age of Onset   Stroke Brother    Alzheimer's disease Brother    Asthma Daughter    Diabetes Sister    Heart disease Sister    Mental illness Sister        depression, anxiety, resolved with treatment   Graves' disease Daughter  Colon cancer Neg Hx    Esophageal cancer Neg Hx    Rectal cancer Neg Hx    Stomach cancer Neg Hx      Review of Systems  Constitutional: Negative.  Negative for chills and fever.  HENT: Negative.  Negative for congestion and sore throat.   Respiratory: Negative.  Negative for cough and shortness of breath.   Cardiovascular: Negative.  Negative for chest pain and palpitations.  Gastrointestinal: Negative.  Negative for abdominal pain, diarrhea, nausea and vomiting.  Genitourinary: Negative.  Negative for dysuria and hematuria.  Skin: Negative.  Negative for rash.  Neurological: Negative.  Negative for dizziness and headaches.  All other systems  reviewed and are negative.  Today's Vitals   10/14/21 1454  BP: 132/82  Pulse: (!) 53  Temp: 98.2 F (36.8 C)  TempSrc: Oral  SpO2: 99%  Weight: 238 lb (108 kg)  Height: 5' (1.524 m)   Body mass index is 46.48 kg/m.  Physical Exam Vitals reviewed.  Constitutional:      Appearance: Normal appearance. She is obese.  HENT:     Head: Normocephalic.     Right Ear: Tympanic membrane, ear canal and external ear normal.     Left Ear: Tympanic membrane, ear canal and external ear normal.     Mouth/Throat:     Mouth: Mucous membranes are moist.     Pharynx: Oropharynx is clear.  Eyes:     Extraocular Movements: Extraocular movements intact.     Conjunctiva/sclera: Conjunctivae normal.     Pupils: Pupils are equal, round, and reactive to light.  Cardiovascular:     Rate and Rhythm: Normal rate and regular rhythm.     Pulses: Normal pulses.     Heart sounds: Normal heart sounds.  Pulmonary:     Effort: Pulmonary effort is normal.     Breath sounds: Normal breath sounds.  Abdominal:     Palpations: Abdomen is soft.     Tenderness: There is no abdominal tenderness.  Musculoskeletal:     Cervical back: No tenderness.  Lymphadenopathy:     Cervical: No cervical adenopathy.  Skin:    General: Skin is warm and dry.     Capillary Refill: Capillary refill takes less than 2 seconds.  Neurological:     General: No focal deficit present.     Mental Status: She is alert and oriented to person, place, and time.  Psychiatric:        Mood and Affect: Mood normal.        Behavior: Behavior normal.     ASSESSMENT & PLAN: A total of 45 minutes was spent with the patient and counseling/coordination of care regarding preparing for this visit, review of most recent office visit notes, review of most recent blood work results, review of multiple chronic medical conditions under management, review of all medications, education on nutrition, prognosis, documentation, and need for  follow-up.  Problem List Items Addressed This Visit       Cardiovascular and Mediastinum   HTN (hypertension) - Primary    Well-controlled hypertension. BP Readings from Last 3 Encounters:  10/14/21 132/82  07/09/20 (!) 160/90  07/03/20 (!) 150/80  Continue lisinopril 40 mg daily and Tenoretic 50-25 mg daily. Dietary approaches to stop hypertension discussed.       Relevant Medications   atenolol-chlorthalidone (TENORETIC) 50-25 MG tablet   lisinopril (ZESTRIL) 40 MG tablet     Genitourinary   Stage 3b chronic kidney disease ([place])    Diet  and nutrition discussed.  Advised to stay well-hydrated and avoid NSAIDs.      Relevant Orders   CBC with Differential/Platelet     Other   Pre-diabetes    Diet and nutrition discussed.  Advised to decrease amount of daily carbohydrate intake.      Relevant Orders   Hemoglobin A1c   Morbid obesity (HCC)    Diet and nutrition discussed.      History of gout    No recent gout flareups.  No tophi on physical examination.  Uric acid levels checked today. Okay to continue reduced dose of daily allopurinol 50 mg.      Relevant Medications   allopurinol (ZYLOPRIM) 100 MG tablet   Other Relevant Orders   CBC with Differential/Platelet   Uric acid   Other Visit Diagnoses     Need for immunization against influenza       Relevant Orders   Flu Vaccine QUAD High Dose(Fluad)   Essential hypertension       Relevant Medications   atenolol-chlorthalidone (TENORETIC) 50-25 MG tablet   lisinopril (ZESTRIL) 40 MG tablet   Other Relevant Orders   Comprehensive metabolic panel   CBC with Differential/Platelet   Lipid panel   Mild intermittent asthma without complication       Relevant Medications   albuterol (VENTOLIN HFA) 108 (90 Base) MCG/ACT inhaler      Patient Instructions  Hypertension, Adult High blood pressure (hypertension) is when the force of blood pumping through the arteries is too strong. The arteries are the blood  vessels that carry blood from the heart throughout the body. Hypertension forces the heart to work harder to pump blood and may cause arteries to become narrow or stiff. Untreated or uncontrolled hypertension can cause a heart attack, heart failure, a stroke, kidney disease, and other problems. A blood pressure reading consists of a higher number over a lower number. Ideally, your blood pressure should be below 120/80. The first ("top") number is called the systolic pressure. It is a measure of the pressure in your arteries as your heart beats. The second ("bottom") number is called the diastolic pressure. It is a measure of the pressure in your arteries as the heart relaxes. What are the causes? The exact cause of this condition is not known. There are some conditions that result in or are related to high blood pressure. What increases the risk? Some risk factors for high blood pressure are under your control. The following factors may make you more likely to develop this condition: Smoking. Having type 2 diabetes mellitus, high cholesterol, or both. Not getting enough exercise or physical activity. Being overweight. Having too much fat, sugar, calories, or salt (sodium) in your diet. Drinking too much alcohol. Some risk factors for high blood pressure may be difficult or impossible to change. Some of these factors include: Having chronic kidney disease. Having a family history of high blood pressure. Age. Risk increases with age. Race. You may be at higher risk if you are African American. Gender. Men are at higher risk than women before age 58. After age 71, women are at higher risk than men. Having obstructive sleep apnea. Stress. What are the signs or symptoms? High blood pressure may not cause symptoms. Very high blood pressure (hypertensive crisis) may cause: Headache. Anxiety. Shortness of breath. Nosebleed. Nausea and vomiting. Vision changes. Severe chest pain. Seizures. How  is this diagnosed? This condition is diagnosed by measuring your blood pressure while you are seated, with your  arm resting on a flat surface, your legs uncrossed, and your feet flat on the floor. The cuff of the blood pressure monitor will be placed directly against the skin of your upper arm at the level of your heart. It should be measured at least twice using the same arm. Certain conditions can cause a difference in blood pressure between your right and left arms. Certain factors can cause blood pressure readings to be lower or higher than normal for a short period of time: When your blood pressure is higher when you are in a health care provider's office than when you are at home, this is called white coat hypertension. Most people with this condition do not need medicines. When your blood pressure is higher at home than when you are in a health care provider's office, this is called masked hypertension. Most people with this condition may need medicines to control blood pressure. If you have a high blood pressure reading during one visit or you have normal blood pressure with other risk factors, you may be asked to: Return on a different day to have your blood pressure checked again. Monitor your blood pressure at home for 1 week or longer. If you are diagnosed with hypertension, you may have other blood or imaging tests to help your health care provider understand your overall risk for other conditions. How is this treated? This condition is treated by making healthy lifestyle changes, such as eating healthy foods, exercising more, and reducing your alcohol intake. Your health care provider may prescribe medicine if lifestyle changes are not enough to get your blood pressure under control, and if: Your systolic blood pressure is above 130. Your diastolic blood pressure is above 80. Your personal target blood pressure may vary depending on your medical conditions, your age, and other  factors. Follow these instructions at home: Eating and drinking  Eat a diet that is high in fiber and potassium, and low in sodium, added sugar, and fat. An example eating plan is called the DASH (Dietary Approaches to Stop Hypertension) diet. To eat this way: Eat plenty of fresh fruits and vegetables. Try to fill one half of your plate at each meal with fruits and vegetables. Eat whole grains, such as whole-wheat pasta, brown rice, or whole-grain bread. Fill about one fourth of your plate with whole grains. Eat or drink low-fat dairy products, such as skim milk or low-fat yogurt. Avoid fatty cuts of meat, processed or cured meats, and poultry with skin. Fill about one fourth of your plate with lean proteins, such as fish, chicken without skin, beans, eggs, or tofu. Avoid pre-made and processed foods. These tend to be higher in sodium, added sugar, and fat. Reduce your daily sodium intake. Most people with hypertension should eat less than 1,500 mg of sodium a day. Do not drink alcohol if: Your health care provider tells you not to drink. You are pregnant, may be pregnant, or are planning to become pregnant. If you drink alcohol: Limit how much you use to: 0-1 drink a day for women. 0-2 drinks a day for men. Be aware of how much alcohol is in your drink. In the [place] one drink equals one 12 oz bottle of beer (355 mL), one 5 oz glass of wine (148 mL), or one 1 oz glass of hard liquor (44 mL). Lifestyle  Work with your health care provider to maintain a healthy body weight or to lose weight. Ask what an ideal weight is for you. Get at  least 30 minutes of exercise most days of the week. Activities may include walking, swimming, or biking. Include exercise to strengthen your muscles (resistance exercise), such as Pilates or lifting weights, as part of your weekly exercise routine. Try to do these types of exercises for 30 minutes at least 3 days a week. Do not use any products that contain  nicotine or tobacco, such as cigarettes, e-cigarettes, and chewing tobacco. If you need help quitting, ask your health care provider. Monitor your blood pressure at home as told by your health care provider. Keep all follow-up visits as told by your health care provider. This is important. Medicines Take over-the-counter and prescription medicines only as told by your health care provider. Follow directions carefully. Blood pressure medicines must be taken as prescribed. Do not skip doses of blood pressure medicine. Doing this puts you at risk for problems and can make the medicine less effective. Ask your health care provider about side effects or reactions to medicines that you should watch for. Contact a health care provider if you: Think you are having a reaction to a medicine you are taking. Have headaches that keep coming back (recurring). Feel dizzy. Have swelling in your ankles. Have trouble with your vision. Get help right away if you: Develop a severe headache or confusion. Have unusual weakness or numbness. Feel faint. Have severe pain in your chest or abdomen. Vomit repeatedly. Have trouble breathing. Summary Hypertension is when the force of blood pumping through your arteries is too strong. If this condition is not controlled, it may put you at risk for serious complications. Your personal target blood pressure may vary depending on your medical conditions, your age, and other factors. For most people, a normal blood pressure is less than 120/80. Hypertension is treated with lifestyle changes, medicines, or a combination of both. Lifestyle changes include losing weight, eating a healthy, low-sodium diet, exercising more, and limiting alcohol. This information is not intended to replace advice given to you by your health care provider. Make sure you discuss any questions you have with your health care provider. Document Revised: 04/21/2018 Document Reviewed: 04/21/2018 Elsevier  Patient Education  2022 [employer], MD [facility]

## 2021-10-14 NOTE — Assessment & Plan Note (Signed)
Diet and nutrition discussed. 

## 2021-10-14 NOTE — Assessment & Plan Note (Addendum)
No recent gout flareups.  No tophi on physical examination.  Uric acid levels checked today. Okay to continue reduced dose of daily allopurinol 50 mg.

## 2021-10-15 ENCOUNTER — Other Ambulatory Visit: Payer: Self-pay | Admitting: Emergency Medicine

## 2021-10-15 DIAGNOSIS — N1832 Chronic kidney disease, stage 3b: Secondary | ICD-10-CM

## 2021-10-15 LAB — HM MAMMOGRAPHY

## 2021-11-26 LAB — HM MAMMOGRAPHY

## 2021-12-03 ENCOUNTER — Telehealth: Payer: Self-pay | Admitting: *Deleted

## 2021-12-03 NOTE — Telephone Encounter (Signed)
Received fax that patient needs a f/u appt with [facility] mammography after her mammogram. Called patient and left message for patient to call [facility] for f/u appt  ?

## 2021-12-30 ENCOUNTER — Other Ambulatory Visit: Payer: Self-pay | Admitting: Emergency Medicine

## 2021-12-30 DIAGNOSIS — I10 Essential (primary) hypertension: Secondary | ICD-10-CM

## 2022-01-10 ENCOUNTER — Telehealth: Payer: Self-pay | Admitting: Emergency Medicine

## 2022-01-10 NOTE — Telephone Encounter (Signed)
Left message for patient to call back to schedule Medicare Annual Wellness Visit   No hx of AWV eligible as of 01/23/14  Please schedule at anytime with [facility] Advisor if patient calls the office back.    24 Minutes appointment   Any questions, please call me at [phone]

## 2022-04-07 ENCOUNTER — Other Ambulatory Visit: Payer: Self-pay | Admitting: Emergency Medicine

## 2022-04-07 DIAGNOSIS — Z8739 Personal history of other diseases of the musculoskeletal system and connective tissue: Secondary | ICD-10-CM

## 2022-04-09 ENCOUNTER — Emergency Department (HOSPITAL_COMMUNITY): Payer: No Typology Code available for payment source

## 2022-04-09 ENCOUNTER — Encounter (HOSPITAL_COMMUNITY): Payer: Self-pay

## 2022-04-09 ENCOUNTER — Other Ambulatory Visit: Payer: Self-pay

## 2022-04-09 ENCOUNTER — Emergency Department (HOSPITAL_COMMUNITY)
Admission: EM | Admit: 2022-04-09 | Discharge: 2022-04-10 | Disposition: A | Discharge status: Home / Self Care | Payer: No Typology Code available for payment source | Attending: Emergency Medicine | Admitting: Emergency Medicine

## 2022-04-09 DIAGNOSIS — R42 Dizziness and giddiness: Secondary | ICD-10-CM | POA: Diagnosis not present

## 2022-04-09 DIAGNOSIS — I129 Hypertensive chronic kidney disease with stage 1 through stage 4 chronic kidney disease, or unspecified chronic kidney disease: Secondary | ICD-10-CM | POA: Diagnosis not present

## 2022-04-09 DIAGNOSIS — I10 Essential (primary) hypertension: Secondary | ICD-10-CM | POA: Diagnosis not present

## 2022-04-09 DIAGNOSIS — Y92410 Unspecified street and highway as the place of occurrence of the external cause: Secondary | ICD-10-CM | POA: Diagnosis not present

## 2022-04-09 DIAGNOSIS — M25562 Pain in left knee: Secondary | ICD-10-CM | POA: Diagnosis not present

## 2022-04-09 DIAGNOSIS — S60511A Abrasion of right hand, initial encounter: Secondary | ICD-10-CM | POA: Insufficient documentation

## 2022-04-09 DIAGNOSIS — M25551 Pain in right hip: Secondary | ICD-10-CM | POA: Diagnosis not present

## 2022-04-09 DIAGNOSIS — S8291XA Unspecified fracture of right lower leg, initial encounter for closed fracture: Secondary | ICD-10-CM | POA: Diagnosis not present

## 2022-04-09 DIAGNOSIS — N1831 Chronic kidney disease, stage 3a: Secondary | ICD-10-CM | POA: Diagnosis not present

## 2022-04-09 DIAGNOSIS — M25571 Pain in right ankle and joints of right foot: Secondary | ICD-10-CM | POA: Diagnosis not present

## 2022-04-09 DIAGNOSIS — Z79899 Other long term (current) drug therapy: Secondary | ICD-10-CM | POA: Insufficient documentation

## 2022-04-09 DIAGNOSIS — S82841A Displaced bimalleolar fracture of right lower leg, initial encounter for closed fracture: Secondary | ICD-10-CM | POA: Diagnosis not present

## 2022-04-09 DIAGNOSIS — M25561 Pain in right knee: Secondary | ICD-10-CM | POA: Diagnosis not present

## 2022-04-09 DIAGNOSIS — T148XXA Other injury of unspecified body region, initial encounter: Secondary | ICD-10-CM

## 2022-04-09 DIAGNOSIS — S99911A Unspecified injury of right ankle, initial encounter: Secondary | ICD-10-CM | POA: Diagnosis present

## 2022-04-09 DIAGNOSIS — S82891A Other fracture of right lower leg, initial encounter for closed fracture: Secondary | ICD-10-CM

## 2022-04-09 LAB — COMPREHENSIVE METABOLIC PANEL
ALT: 13 U/L (ref 0–44)
AST: 19 U/L (ref 15–41)
Albumin: 3.5 g/dL (ref 3.5–5.0)
Alkaline Phosphatase: 95 U/L (ref 38–126)
Anion gap: 9 (ref 5–15)
BUN: 34 mg/dL — ABNORMAL HIGH (ref 8–23)
CO2: 27 mmol/L (ref 22–32)
Calcium: 9.4 mg/dL (ref 8.9–10.3)
Chloride: 107 mmol/L (ref 98–111)
Creatinine, Ser: 2.02 mg/dL — ABNORMAL HIGH (ref 0.44–1.00)
GFR, Estimated: 25 mL/min — ABNORMAL LOW (ref >60)
Glucose, Bld: 111 mg/dL — ABNORMAL HIGH (ref 70–99)
Potassium: 4.5 mmol/L (ref 3.5–5.1)
Sodium: 143 mmol/L (ref 135–145)
Total Bilirubin: 0.5 mg/dL (ref 0.3–1.2)
Total Protein: 7.3 g/dL (ref 6.5–8.1)

## 2022-04-09 LAB — CBC WITH DIFFERENTIAL/PLATELET
Abs Immature Granulocytes: 0.04 10*3/uL (ref 0.00–0.07)
Basophils Absolute: 0 10*3/uL (ref 0.0–0.1)
Basophils Relative: 0 %
Eosinophils Absolute: 0.1 10*3/uL (ref 0.0–0.5)
Eosinophils Relative: 2 %
HCT: 36 % (ref 36.0–46.0)
Hemoglobin: 11.8 g/dL — ABNORMAL LOW (ref 12.0–15.0)
Immature Granulocytes: 1 %
Lymphocytes Relative: 26 %
Lymphs Abs: 2.2 10*3/uL (ref 0.7–4.0)
MCH: 25.3 pg — ABNORMAL LOW (ref 26.0–34.0)
MCHC: 32.8 g/dL (ref 30.0–36.0)
MCV: 77.3 fL — ABNORMAL LOW (ref 80.0–100.0)
Monocytes Absolute: 0.4 10*3/uL (ref 0.1–1.0)
Monocytes Relative: 5 %
Neutro Abs: 5.7 10*3/uL (ref 1.7–7.7)
Neutrophils Relative %: 66 %
Platelets: 248 10*3/uL (ref 150–400)
RBC: 4.66 MIL/uL (ref 3.87–5.11)
RDW: 16.1 % — ABNORMAL HIGH (ref 11.5–15.5)
WBC: 8.5 10*3/uL (ref 4.0–10.5)
nRBC: 0 % (ref 0.0–0.2)

## 2022-04-09 MED ORDER — FENTANYL CITRATE PF 50 MCG/ML IJ SOSY
50.0000 ug | PREFILLED_SYRINGE | Freq: Once | INTRAMUSCULAR | Status: AC
  Administered 2022-04-10: 50 ug via INTRAVENOUS
  Filled 2022-04-09: qty 1

## 2022-04-09 MED ORDER — ACETAMINOPHEN 500 MG PO TABS
1000.0000 mg | ORAL_TABLET | Freq: Once | ORAL | Status: AC
  Administered 2022-04-10: 1000 mg via ORAL
  Filled 2022-04-09: qty 2

## 2022-04-09 NOTE — ED Notes (Signed)
Ortho tech called at this time and reports that she will be down shortly
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
## 2022-04-10 DIAGNOSIS — S82841A Displaced bimalleolar fracture of right lower leg, initial encounter for closed fracture: Secondary | ICD-10-CM | POA: Diagnosis not present

## 2022-04-10 MED ORDER — OXYCODONE HCL 5 MG PO TABS: 2.5000 mg | ORAL_TABLET | Freq: Four times a day (QID) | ORAL | 0 refills | Status: AC | PRN

## 2022-04-10 NOTE — ED Notes (Signed)
Patient assisted to restroom at this time in a wheelchair

## 2022-04-10 NOTE — Progress Notes (Signed)
Orthopedic Tech Progress Note Patient Details:  CHRISHAWN KRING [birth year] [id number]  Ortho Devices Type of Ortho Device: Stirrup splint, Post (short leg) splint Ortho Device/Splint Location: RLE Ortho Device/Splint Interventions: Ordered, Application, Adjustment   Post Interventions Patient Tolerated: Well Instructions Provided: Care of device  [facility] 04/10/2022, 12:43 AM
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
## 2022-04-10 NOTE — Discharge Instructions (Addendum)
For pain control you may take at 1000 mg of Tylenol every 8 hours scheduled.  In addition you can take 0.5 to 1 tablet of Oxycodone every 6 hours as needed for pain not controlled with the scheduled Tylenol. ? ?

## 2022-04-10 NOTE — ED Notes (Signed)
Ortho tech at bedside at this time 

## 2022-04-14 ENCOUNTER — Ambulatory Visit: Payer: Medicare PPO | Admitting: Emergency Medicine

## 2022-04-29 ENCOUNTER — Ambulatory Visit (INDEPENDENT_AMBULATORY_CARE_PROVIDER_SITE_OTHER): Payer: No Typology Code available for payment source | Admitting: Emergency Medicine

## 2022-04-29 ENCOUNTER — Encounter: Payer: Self-pay | Admitting: Emergency Medicine

## 2022-04-29 VITALS — BP 138/78 | HR 59 | Temp 97.9°F | Ht 61.0 in | Wt 232.5 lb

## 2022-04-29 DIAGNOSIS — N1832 Chronic kidney disease, stage 3b: Secondary | ICD-10-CM | POA: Diagnosis not present

## 2022-04-29 DIAGNOSIS — I10 Essential (primary) hypertension: Secondary | ICD-10-CM

## 2022-04-29 LAB — CBC WITH DIFFERENTIAL/PLATELET
Basophils Absolute: 0 10*3/uL (ref 0.0–0.1)
Basophils Relative: 0.5 % (ref 0.0–3.0)
Eosinophils Absolute: 0.2 10*3/uL (ref 0.0–0.7)
Eosinophils Relative: 2.8 % (ref 0.0–5.0)
HCT: 35.5 % — ABNORMAL LOW (ref 36.0–46.0)
Hemoglobin: 11.4 g/dL — ABNORMAL LOW (ref 12.0–15.0)
Lymphocytes Relative: 23.4 % (ref 12.0–46.0)
Lymphs Abs: 1.7 10*3/uL (ref 0.7–4.0)
MCHC: 32.2 g/dL (ref 30.0–36.0)
MCV: 78.8 fl (ref 78.0–100.0)
Monocytes Absolute: 0.3 10*3/uL (ref 0.1–1.0)
Monocytes Relative: 4.8 % (ref 3.0–12.0)
Neutro Abs: 4.9 10*3/uL (ref 1.4–7.7)
Neutrophils Relative %: 68.5 % (ref 43.0–77.0)
Platelets: 277 10*3/uL (ref 150.0–400.0)
RBC: 4.51 Mil/uL (ref 3.87–5.11)
RDW: 15.9 % — ABNORMAL HIGH (ref 11.5–15.5)
WBC: 7.2 10*3/uL (ref 4.0–10.5)

## 2022-04-29 LAB — COMPREHENSIVE METABOLIC PANEL
ALT: 20 U/L (ref 0–35)
AST: 17 U/L (ref 0–37)
Albumin: 3.7 g/dL (ref 3.5–5.2)
Alkaline Phosphatase: 126 U/L — ABNORMAL HIGH (ref 39–117)
BUN: 26 mg/dL — ABNORMAL HIGH (ref 6–23)
CO2: 31 mEq/L (ref 19–32)
Calcium: 9.9 mg/dL (ref 8.4–10.5)
Chloride: 103 mEq/L (ref 96–112)
Creatinine, Ser: 1.36 mg/dL — ABNORMAL HIGH (ref 0.40–1.20)
GFR: 38.44 mL/min — ABNORMAL LOW (ref >60.00)
Glucose, Bld: 110 mg/dL — ABNORMAL HIGH (ref 70–99)
Potassium: 3.7 mEq/L (ref 3.5–5.1)
Sodium: 142 mEq/L (ref 135–145)
Total Bilirubin: 0.3 mg/dL (ref 0.2–1.2)
Total Protein: 7.6 g/dL (ref 6.0–8.3)

## 2022-04-29 MED ORDER — LISINOPRIL 40 MG PO TABS: 40.0000 mg | ORAL_TABLET | Freq: Every day | ORAL | 3 refills | Status: DC

## 2022-04-29 MED ORDER — ATENOLOL-CHLORTHALIDONE 50-25 MG PO TABS: 1.0000 | ORAL_TABLET | Freq: Every day | ORAL | 3 refills | Status: DC

## 2022-04-29 NOTE — Assessment & Plan Note (Signed)
Well-controlled hypertension. Continue Tenoretic 50-25 daily and lisinopril 40 mg daily. BP Readings from Last 3 Encounters:  04/29/22 138/78  04/10/22 (!) 162/87  10/14/21 132/82  Dietary approaches to stop hypertension discussed.

## 2022-04-29 NOTE — Patient Instructions (Signed)
Hypertension, Adult High blood pressure (hypertension) is when the force of blood pumping through the arteries is too strong. The arteries are the blood vessels that carry blood from the heart throughout the body. Hypertension forces the heart to work harder to pump blood and may cause arteries to become narrow or stiff. Untreated or uncontrolled hypertension can lead to a heart attack, heart failure, a stroke, kidney disease, and other problems. A blood pressure reading consists of a higher number over a lower number. Ideally, your blood pressure should be below 120/80. The first ("top") number is called the systolic pressure. It is a measure of the pressure in your arteries as your heart beats. The second ("bottom") number is called the diastolic pressure. It is a measure of the pressure in your arteries as the heart relaxes. What are the causes? The exact cause of this condition is not known. There are some conditions that result in high blood pressure. What increases the risk? Certain factors may make you more likely to develop high blood pressure. Some of these risk factors are under your control, including: Smoking. Not getting enough exercise or physical activity. Being overweight. Having too much fat, sugar, calories, or salt (sodium) in your diet. Drinking too much alcohol. Other risk factors include: Having a personal history of heart disease, diabetes, high cholesterol, or kidney disease. Stress. Having a family history of high blood pressure and high cholesterol. Having obstructive sleep apnea. Age. The risk increases with age. What are the signs or symptoms? High blood pressure may not cause symptoms. Very high blood pressure (hypertensive crisis) may cause: Headache. Fast or irregular heartbeats (palpitations). Shortness of breath. Nosebleed. Nausea and vomiting. Vision changes. Severe chest pain, dizziness, and seizures. How is this diagnosed? This condition is diagnosed by  measuring your blood pressure while you are seated, with your arm resting on a flat surface, your legs uncrossed, and your feet flat on the floor. The cuff of the blood pressure monitor will be placed directly against the skin of your upper arm at the level of your heart. Blood pressure should be measured at least twice using the same arm. Certain conditions can cause a difference in blood pressure between your right and left arms. If you have a high blood pressure reading during one visit or you have normal blood pressure with other risk factors, you may be asked to: Return on a different day to have your blood pressure checked again. Monitor your blood pressure at home for 1 week or longer. If you are diagnosed with hypertension, you may have other blood or imaging tests to help your health care provider understand your overall risk for other conditions. How is this treated? This condition is treated by making healthy lifestyle changes, such as eating healthy foods, exercising more, and reducing your alcohol intake. You may be referred for counseling on a healthy diet and physical activity. Your health care provider may prescribe medicine if lifestyle changes are not enough to get your blood pressure under control and if: Your systolic blood pressure is above 130. Your diastolic blood pressure is above 80. Your personal target blood pressure may vary depending on your medical conditions, your age, and other factors. Follow these instructions at home: Eating and drinking  Eat a diet that is high in fiber and potassium, and low in sodium, added sugar, and fat. An example of this eating plan is called the DASH diet. DASH stands for Dietary Approaches to Stop Hypertension. To eat this way: Eat   plenty of fresh fruits and vegetables. Try to fill one half of your plate at each meal with fruits and vegetables. Eat whole grains, such as whole-wheat pasta, brown rice, or whole-grain bread. Fill about one  fourth of your plate with whole grains. Eat or drink low-fat dairy products, such as skim milk or low-fat yogurt. Avoid fatty cuts of meat, processed or cured meats, and poultry with skin. Fill about one fourth of your plate with lean proteins, such as fish, chicken without skin, beans, eggs, or tofu. Avoid pre-made and processed foods. These tend to be higher in sodium, added sugar, and fat. Reduce your daily sodium intake. Many people with hypertension should eat less than 1,500 mg of sodium a day. Do not drink alcohol if: Your health care provider tells you not to drink. You are pregnant, may be pregnant, or are planning to become pregnant. If you drink alcohol: Limit how much you have to: 0-1 drink a day for women. 0-2 drinks a day for men. Know how much alcohol is in your drink. In the [place] one drink equals one 12 oz bottle of beer (355 mL), one 5 oz glass of wine (148 mL), or one 1 oz glass of hard liquor (44 mL). Lifestyle  Work with your health care provider to maintain a healthy body weight or to lose weight. Ask what an ideal weight is for you. Get at least 30 minutes of exercise that causes your heart to beat faster (aerobic exercise) most days of the week. Activities may include walking, swimming, or biking. Include exercise to strengthen your muscles (resistance exercise), such as Pilates or lifting weights, as part of your weekly exercise routine. Try to do these types of exercises for 30 minutes at least 3 days a week. Do not use any products that contain nicotine or tobacco. These products include cigarettes, chewing tobacco, and vaping devices, such as e-cigarettes. If you need help quitting, ask your health care provider. Monitor your blood pressure at home as told by your health care provider. Keep all follow-up visits. This is important. Medicines Take over-the-counter and prescription medicines only as told by your health care provider. Follow directions carefully. Blood  pressure medicines must be taken as prescribed. Do not skip doses of blood pressure medicine. Doing this puts you at risk for problems and can make the medicine less effective. Ask your health care provider about side effects or reactions to medicines that you should watch for. Contact a health care provider if you: Think you are having a reaction to a medicine you are taking. Have headaches that keep coming back (recurring). Feel dizzy. Have swelling in your ankles. Have trouble with your vision. Get help right away if you: Develop a severe headache or confusion. Have unusual weakness or numbness. Feel faint. Have severe pain in your chest or abdomen. Vomit repeatedly. Have trouble breathing. These symptoms may be an emergency. Get help right away. Call 911. Do not wait to see if the symptoms will go away. Do not drive yourself to the hospital. Summary Hypertension is when the force of blood pumping through your arteries is too strong. If this condition is not controlled, it may put you at risk for serious complications. Your personal target blood pressure may vary depending on your medical conditions, your age, and other factors. For most people, a normal blood pressure is less than 120/80. Hypertension is treated with lifestyle changes, medicines, or a combination of both. Lifestyle changes include losing weight, eating a healthy,   low-sodium diet, exercising more, and limiting alcohol. This information is not intended to replace advice given to you by your health care provider. Make sure you discuss any questions you have with your health care provider. Document Revised: 06/18/2021 Document Reviewed: 06/18/2021 Elsevier Patient Education  2023 [employer]  

## 2022-04-29 NOTE — Assessment & Plan Note (Signed)
Advised to decrease amount of daily carbohydrate intake and daily calories and increase amount of plant based protein in her diet.

## 2022-04-29 NOTE — Assessment & Plan Note (Signed)
Most recent blood work results from 04/09/2022 shows worsened renal function with GFR of 25 and creatinine up to 2.  We will repeat CMP today.  Needs nephrology evaluation.  Referral placed today. Advised to stay well-hydrated and avoid NSAIDs.

## 2022-04-29 NOTE — Progress Notes (Signed)
Miranda [name] 76 y.o.   Chief Complaint  Patient presents with   Follow-up    60mth f/u appt no concerns     HISTORY OF PRESENT ILLNESS: This is a 76y.o. female here for 631-monthollow-up of hypertension. Sustained ankle injury/fracture on 04/09/2022.  No surgery.  Has a cast on. Has follow-up with orthopedist tomorrow.  Handling it well. Has no complaints or medical concerns today.  HPI   Prior to Admission medications   Medication Sig Start Date End Date Taking? Authorizing Provider  albuterol (VENTOLIN HFA) 108 (90 Base) MCG/ACT inhaler Inhale 2 puffs into the lungs every 4 (four) hours as needed for wheezing or shortness of breath (cough, shortness of breath or wheezing.). 10/14/21  Yes Telesa Jeancharles, MiInes BloomerMD  allopurinol (ZYLOPRIM) 100 MG tablet TAKE 1/2 TABLET(50 MG) BY MOUTH DAILY Patient taking differently: Take 50 mg by mouth daily. 04/07/22  Yes Surafel Hilleary, MiInes BloomerMD  meclizine (ANTIVERT) 25 MG tablet TAKE 1 TABLET(25 MG) BY MOUTH THREE TIMES DAILY AS NEEDED FOR DIZZINESS 01/22/21  Yes Khayla Koppenhaver, MiInes BloomerMD  atenolol-chlorthalidone (TENORETIC) 50-25 MG tablet Take 1 tablet by mouth daily. 04/29/22 04/24/23  SaHorald PollenMD  lisinopril (ZESTRIL) 40 MG tablet Take 1 tablet (40 mg total) by mouth daily. 04/29/22 04/24/23  SaHorald PollenMD    No Known Allergies  Patient Active Problem List   Diagnosis Date Noted   History of gout 10/14/2021   Morbid obesity ([facility]/15/[place]   Stage 3b chronic kidney disease ([facility]/15/2021   Environmental allergies 03/27/2018   BMI 45.0-49.9, adult ([facility]21/2017   Pre-diabetes 02/28/2015   HTN (hypertension) 01/17/2012    Past Medical History:  Diagnosis Date   Allergy    Arthritis    Hypertension     Past Surgical History:  Procedure Laterality Date   ABDOMINAL HYSTERECTOMY     breast biopsy Left    benign   CHOLECYSTECTOMY      Social History   Socioeconomic History   Marital status: Married     Spouse name: AlSheppard Coil Number of children: 2   Years of education: HS   Highest education level: Not on file  Occupational History   Occupation: Deli Counter    Comment: [employer]  Tobacco Use   Smoking status: Never   Smokeless tobacco: Never  Vaping Use   Vaping Use: Never used  Substance and Sexual Activity   Alcohol use: Yes    Alcohol/week: 0.0 standard drinks of alcohol    Comment: very seldom   Drug use: No   Sexual activity: Yes  Other Topics Concern   Not on file  Social History Narrative   1-2 sodas a day, daily consumption of tea   Lives with her husband and and their youngest daughter.   Social Determinants of Health   Financial Resource Strain: Not on file  Food Insecurity: Not on file  Transportation Needs: Not on file  Physical Activity: Not on file  Stress: Not on file  Social Connections: Not on file  Intimate Partner Violence: Not on file    Family History  Problem Relation Age of Onset   Stroke Brother    Alzheimer's disease Brother    Asthma Daughter    Diabetes Sister    Heart disease Sister    Mental illness Sister        depression, anxiety, resolved with treatment   Graves' disease Daughter    Colon cancer Neg Hx  Esophageal cancer Neg Hx    Rectal cancer Neg Hx    Stomach cancer Neg Hx      Review of Systems  Constitutional: Negative.  Negative for chills and fever.  HENT: Negative.  Negative for congestion and sore throat.   Respiratory: Negative.  Negative for cough and shortness of breath.   Cardiovascular: Negative.  Negative for chest pain and palpitations.  Gastrointestinal: Negative.  Negative for abdominal pain, diarrhea, nausea and vomiting.  Genitourinary: Negative.   Skin: Negative.  Negative for rash.  Neurological: Negative.  Negative for dizziness and headaches.  All other systems reviewed and are negative.  Today's Vitals   04/29/22 1044 04/29/22 1049 04/29/22 1157  BP: (!) 160/80 (!) 156/76 138/78   Pulse: (!) 59    Temp: 97.9 F (36.6 C)    TempSrc: Oral    SpO2: 97%    Weight: 232 lb 8 oz (105.5 kg)    Height: '5\' 1"'$  (1.549 m)     Body mass index is 43.93 kg/m.   Physical Exam Vitals reviewed.  Constitutional:      Appearance: Normal appearance.  HENT:     Head: Normocephalic.     Mouth/Throat:     Mouth: Mucous membranes are moist.     Pharynx: Oropharynx is clear.  Eyes:     Extraocular Movements: Extraocular movements intact.     Pupils: Pupils are equal, round, and reactive to light.  Cardiovascular:     Rate and Rhythm: Normal rate and regular rhythm.     Pulses: Normal pulses.     Heart sounds: Normal heart sounds.  Pulmonary:     Effort: Pulmonary effort is normal.     Breath sounds: Normal breath sounds.  Musculoskeletal:     Cervical back: No tenderness.     Comments: Right ankle in a cast.  Lymphadenopathy:     Cervical: No cervical adenopathy.  Skin:    General: Skin is warm and dry.     Capillary Refill: Capillary refill takes less than 2 seconds.  Neurological:     General: No focal deficit present.     Mental Status: She is alert and oriented to person, place, and time.  Psychiatric:        Mood and Affect: Mood normal.        Behavior: Behavior normal.      ASSESSMENT & PLAN: A total of 46 minutes was spent with the patient and counseling/coordination of care regarding preparing for this visit, review of most recent office visit notes, review of multiple chronic medical problems under management, review of all medications, education on nutrition, prognosis, documentation, and need for follow-up.  Problem List Items Addressed This Visit       Cardiovascular and Mediastinum   HTN (hypertension) - Primary    Well-controlled hypertension. Continue Tenoretic 50-25 daily and lisinopril 40 mg daily. BP Readings from Last 3 Encounters:  04/29/22 138/78  04/10/22 (!) 162/87  10/14/21 132/82  Dietary approaches to stop hypertension  discussed.       Relevant Medications   atenolol-chlorthalidone (TENORETIC) 50-25 MG tablet   lisinopril (ZESTRIL) 40 MG tablet     Genitourinary   Stage 3b chronic kidney disease ([place])    Most recent blood work results from 04/09/2022 shows worsened renal function with GFR of 25 and creatinine up to 2.  We will repeat CMP today.  Needs nephrology evaluation.  Referral placed today. Advised to stay well-hydrated and avoid NSAIDs.  Relevant Orders   CBC with Differential/Platelet   Comprehensive metabolic panel   Ambulatory referral to Nephrology     Other   Morbid obesity ([place])    Advised to decrease amount of daily carbohydrate intake and daily calories and increase amount of plant based protein in her diet.      Other Visit Diagnoses     Essential hypertension       Relevant Medications   atenolol-chlorthalidone (TENORETIC) 50-25 MG tablet   lisinopril (ZESTRIL) 40 MG tablet   Other Relevant Orders   CBC with Differential/Platelet      Patient Instructions  Hypertension, Adult High blood pressure (hypertension) is when the force of blood pumping through the arteries is too strong. The arteries are the blood vessels that carry blood from the heart throughout the body. Hypertension forces the heart to work harder to pump blood and may cause arteries to become narrow or stiff. Untreated or uncontrolled hypertension can lead to a heart attack, heart failure, a stroke, kidney disease, and other problems. A blood pressure reading consists of a higher number over a lower number. Ideally, your blood pressure should be below 120/80. The first ("top") number is called the systolic pressure. It is a measure of the pressure in your arteries as your heart beats. The second ("bottom") number is called the diastolic pressure. It is a measure of the pressure in your arteries as the heart relaxes. What are the causes? The exact cause of this condition is not known. There are some  conditions that result in high blood pressure. What increases the risk? Certain factors may make you more likely to develop high blood pressure. Some of these risk factors are under your control, including: Smoking. Not getting enough exercise or physical activity. Being overweight. Having too much fat, sugar, calories, or salt (sodium) in your diet. Drinking too much alcohol. Other risk factors include: Having a personal history of heart disease, diabetes, high cholesterol, or kidney disease. Stress. Having a family history of high blood pressure and high cholesterol. Having obstructive sleep apnea. Age. The risk increases with age. What are the signs or symptoms? High blood pressure may not cause symptoms. Very high blood pressure (hypertensive crisis) may cause: Headache. Fast or irregular heartbeats (palpitations). Shortness of breath. Nosebleed. Nausea and vomiting. Vision changes. Severe chest pain, dizziness, and seizures. How is this diagnosed? This condition is diagnosed by measuring your blood pressure while you are seated, with your arm resting on a flat surface, your legs uncrossed, and your feet flat on the floor. The cuff of the blood pressure monitor will be placed directly against the skin of your upper arm at the level of your heart. Blood pressure should be measured at least twice using the same arm. Certain conditions can cause a difference in blood pressure between your right and left arms. If you have a high blood pressure reading during one visit or you have normal blood pressure with other risk factors, you may be asked to: Return on a different day to have your blood pressure checked again. Monitor your blood pressure at home for 1 week or longer. If you are diagnosed with hypertension, you may have other blood or imaging tests to help your health care provider understand your overall risk for other conditions. How is this treated? This condition is treated by  making healthy lifestyle changes, such as eating healthy foods, exercising more, and reducing your alcohol intake. You may be referred for counseling on a healthy diet  and physical activity. Your health care provider may prescribe medicine if lifestyle changes are not enough to get your blood pressure under control and if: Your systolic blood pressure is above 130. Your diastolic blood pressure is above 80. Your personal target blood pressure may vary depending on your medical conditions, your age, and other factors. Follow these instructions at home: Eating and drinking  Eat a diet that is high in fiber and potassium, and low in sodium, added sugar, and fat. An example of this eating plan is called the DASH diet. DASH stands for Dietary Approaches to Stop Hypertension. To eat this way: Eat plenty of fresh fruits and vegetables. Try to fill one half of your plate at each meal with fruits and vegetables. Eat whole grains, such as whole-wheat pasta, brown rice, or whole-grain bread. Fill about one fourth of your plate with whole grains. Eat or drink low-fat dairy products, such as skim milk or low-fat yogurt. Avoid fatty cuts of meat, processed or cured meats, and poultry with skin. Fill about one fourth of your plate with lean proteins, such as fish, chicken without skin, beans, eggs, or tofu. Avoid pre-made and processed foods. These tend to be higher in sodium, added sugar, and fat. Reduce your daily sodium intake. Many people with hypertension should eat less than 1,500 mg of sodium a day. Do not drink alcohol if: Your health care provider tells you not to drink. You are pregnant, may be pregnant, or are planning to become pregnant. If you drink alcohol: Limit how much you have to: 0-1 drink a day for women. 0-2 drinks a day for men. Know how much alcohol is in your drink. In the [place] one drink equals one 12 oz bottle of beer (355 mL), one 5 oz glass of wine (148 mL), or one 1 oz glass of  hard liquor (44 mL). Lifestyle  Work with your health care provider to maintain a healthy body weight or to lose weight. Ask what an ideal weight is for you. Get at least 30 minutes of exercise that causes your heart to beat faster (aerobic exercise) most days of the week. Activities may include walking, swimming, or biking. Include exercise to strengthen your muscles (resistance exercise), such as Pilates or lifting weights, as part of your weekly exercise routine. Try to do these types of exercises for 30 minutes at least 3 days a week. Do not use any products that contain nicotine or tobacco. These products include cigarettes, chewing tobacco, and vaping devices, such as e-cigarettes. If you need help quitting, ask your health care provider. Monitor your blood pressure at home as told by your health care provider. Keep all follow-up visits. This is important. Medicines Take over-the-counter and prescription medicines only as told by your health care provider. Follow directions carefully. Blood pressure medicines must be taken as prescribed. Do not skip doses of blood pressure medicine. Doing this puts you at risk for problems and can make the medicine less effective. Ask your health care provider about side effects or reactions to medicines that you should watch for. Contact a health care provider if you: Think you are having a reaction to a medicine you are taking. Have headaches that keep coming back (recurring). Feel dizzy. Have swelling in your ankles. Have trouble with your vision. Get help right away if you: Develop a severe headache or confusion. Have unusual weakness or numbness. Feel faint. Have severe pain in your chest or abdomen. Vomit repeatedly. Have trouble breathing. These symptoms  may be an emergency. Get help right away. Call 911. Do not wait to see if the symptoms will go away. Do not drive yourself to the hospital. Summary Hypertension is when the force of blood  pumping through your arteries is too strong. If this condition is not controlled, it may put you at risk for serious complications. Your personal target blood pressure may vary depending on your medical conditions, your age, and other factors. For most people, a normal blood pressure is less than 120/80. Hypertension is treated with lifestyle changes, medicines, or a combination of both. Lifestyle changes include losing weight, eating a healthy, low-sodium diet, exercising more, and limiting alcohol. This information is not intended to replace advice given to you by your health care provider. Make sure you discuss any questions you have with your health care provider. Document Revised: 06/18/2021 Document Reviewed: 06/18/2021 Elsevier Patient Education  [place], MD [facility]

## 2022-05-16 ENCOUNTER — Telehealth: Payer: Self-pay | Admitting: Emergency Medicine

## 2022-05-16 NOTE — Telephone Encounter (Signed)
Patients referral to [facility] has expired - please send again.

## 2022-05-19 NOTE — Telephone Encounter (Signed)
Called patient and was unable to leave message in reference to his referral. Patient referral in Epic is still active and not expired until 2024.  Patient number didn't do through will try again later.

## 2022-05-23 DIAGNOSIS — S82144A Nondisplaced bicondylar fracture of right tibia, initial encounter for closed fracture: Secondary | ICD-10-CM | POA: Diagnosis not present

## 2022-06-09 ENCOUNTER — Telehealth: Payer: Self-pay

## 2022-06-09 NOTE — Telephone Encounter (Signed)
Miranda [name] is calling in from [facility] health with the patient on the line. [name] need CPAP supplies but [facility] needs an order sent to them at [phone].

## 2022-06-09 NOTE — Telephone Encounter (Signed)
Okay to order but sleep medicine department should be the ones doing this.

## 2022-06-10 DIAGNOSIS — I129 Hypertensive chronic kidney disease with stage 1 through stage 4 chronic kidney disease, or unspecified chronic kidney disease: Secondary | ICD-10-CM | POA: Diagnosis not present

## 2022-06-10 DIAGNOSIS — G4733 Obstructive sleep apnea (adult) (pediatric): Secondary | ICD-10-CM | POA: Diagnosis not present

## 2022-06-10 DIAGNOSIS — Z6838 Body mass index (BMI) 38.0-38.9, adult: Secondary | ICD-10-CM | POA: Diagnosis not present

## 2022-06-10 DIAGNOSIS — R2681 Unsteadiness on feet: Secondary | ICD-10-CM | POA: Diagnosis not present

## 2022-06-10 DIAGNOSIS — Z008 Encounter for other general examination: Secondary | ICD-10-CM | POA: Diagnosis not present

## 2022-06-10 DIAGNOSIS — N189 Chronic kidney disease, unspecified: Secondary | ICD-10-CM | POA: Diagnosis not present

## 2022-06-10 NOTE — Telephone Encounter (Signed)
Thanks

## 2022-06-10 NOTE — Addendum Note (Signed)
Addended by: Rae Mar on: 06/10/2022 09:10 AM   Modules accepted: Orders

## 2022-06-10 NOTE — Telephone Encounter (Signed)
Called patient she is going to call her sleep study specialist to get these CPAP supplies ordered.

## 2022-07-11 DIAGNOSIS — G4733 Obstructive sleep apnea (adult) (pediatric): Secondary | ICD-10-CM | POA: Diagnosis not present

## 2022-07-14 DIAGNOSIS — R7303 Prediabetes: Secondary | ICD-10-CM | POA: Diagnosis not present

## 2022-07-14 DIAGNOSIS — N1832 Chronic kidney disease, stage 3b: Secondary | ICD-10-CM | POA: Diagnosis not present

## 2022-07-14 DIAGNOSIS — N1831 Chronic kidney disease, stage 3a: Secondary | ICD-10-CM | POA: Diagnosis not present

## 2022-07-14 DIAGNOSIS — M109 Gout, unspecified: Secondary | ICD-10-CM | POA: Diagnosis not present

## 2022-07-14 DIAGNOSIS — I129 Hypertensive chronic kidney disease with stage 1 through stage 4 chronic kidney disease, or unspecified chronic kidney disease: Secondary | ICD-10-CM | POA: Diagnosis not present

## 2022-07-14 DIAGNOSIS — E669 Obesity, unspecified: Secondary | ICD-10-CM | POA: Diagnosis not present

## 2022-08-06 ENCOUNTER — Other Ambulatory Visit: Payer: Self-pay | Admitting: Nephrology

## 2022-08-06 DIAGNOSIS — M109 Gout, unspecified: Secondary | ICD-10-CM

## 2022-08-06 DIAGNOSIS — N1831 Chronic kidney disease, stage 3a: Secondary | ICD-10-CM

## 2022-08-06 DIAGNOSIS — R7303 Prediabetes: Secondary | ICD-10-CM

## 2022-08-06 DIAGNOSIS — E669 Obesity, unspecified: Secondary | ICD-10-CM

## 2022-08-06 DIAGNOSIS — I129 Hypertensive chronic kidney disease with stage 1 through stage 4 chronic kidney disease, or unspecified chronic kidney disease: Secondary | ICD-10-CM

## 2022-08-07 ENCOUNTER — Ambulatory Visit
Admission: RE | Admit: 2022-08-07 | Discharge: 2022-08-07 | Disposition: A | Discharge status: Home / Self Care | Payer: No Typology Code available for payment source | Source: Ambulatory Visit | Attending: Nephrology | Admitting: Nephrology

## 2022-08-07 DIAGNOSIS — E669 Obesity, unspecified: Secondary | ICD-10-CM

## 2022-08-07 DIAGNOSIS — I129 Hypertensive chronic kidney disease with stage 1 through stage 4 chronic kidney disease, or unspecified chronic kidney disease: Secondary | ICD-10-CM

## 2022-08-07 DIAGNOSIS — R7303 Prediabetes: Secondary | ICD-10-CM

## 2022-08-07 DIAGNOSIS — M109 Gout, unspecified: Secondary | ICD-10-CM

## 2022-08-07 DIAGNOSIS — N1831 Chronic kidney disease, stage 3a: Secondary | ICD-10-CM

## 2022-08-07 DIAGNOSIS — N189 Chronic kidney disease, unspecified: Secondary | ICD-10-CM | POA: Diagnosis not present

## 2022-08-10 DIAGNOSIS — G4733 Obstructive sleep apnea (adult) (pediatric): Secondary | ICD-10-CM | POA: Diagnosis not present

## 2022-09-02 ENCOUNTER — Ambulatory Visit (INDEPENDENT_AMBULATORY_CARE_PROVIDER_SITE_OTHER): Payer: No Typology Code available for payment source

## 2022-09-02 VITALS — Ht 61.0 in | Wt 235.0 lb

## 2022-09-02 DIAGNOSIS — Z0000 Encounter for general adult medical examination without abnormal findings: Secondary | ICD-10-CM | POA: Diagnosis not present

## 2022-09-02 NOTE — Progress Notes (Signed)
Virtual Visit via Telephone Note  I connected with  Miranda [name] on 09/02/22 at  3:15 PM EST by telephone and verified that I am speaking with the correct person using two identifiers.  Location: Patient: Home Provider: [place] Persons participating in the virtual visit: [place]   I discussed the limitations, risks, security and privacy concerns of performing an evaluation and management service by telephone and the availability of in person appointments. The patient expressed understanding and agreed to proceed.  Interactive audio and video telecommunications were attempted between this nurse and patient, however failed, due to patient having technical difficulties OR patient did not have access to video capability.  We continued and completed visit with audio only.  Some vital signs may be absent or patient reported.   Sheral Flow, LPN  Subjective:   Miranda [name] is a 77 y.o. female who presents for an Initial Medicare Annual Wellness Visit.  Review of Systems     Cardiac Risk Factors include: advanced age (>76mn, >>79women);hypertension;obesity (BMI >30kg/m2);family history of premature cardiovascular disease     Objective:    Today's Vitals   09/02/22 1517  Weight: 235 lb (106.6 kg)  Height: '5\' 1"'$  (1.549 m)  PainSc: 0-No pain   Body mass index is 44.4 kg/m.     09/02/2022    3:19 PM 04/09/2022   10:41 PM  Advanced Directives  Does Patient Have a Medical Advance Directive? No No  Would patient like information on creating a medical advance directive? No - Patient declined     Current Medications (verified) Outpatient Encounter Medications as of 09/02/2022  Medication Sig   albuterol (VENTOLIN HFA) 108 (90 Base) MCG/ACT inhaler Inhale 2 puffs into the lungs every 4 (four) hours as needed for wheezing or shortness of breath (cough, shortness of breath or wheezing.).   allopurinol (ZYLOPRIM) 100 MG tablet TAKE 1/2 TABLET(50 MG) BY  MOUTH DAILY (Patient taking differently: Take 50 mg by mouth daily.)   atenolol-chlorthalidone (TENORETIC) 50-25 MG tablet Take 1 tablet by mouth daily.   lisinopril (ZESTRIL) 40 MG tablet Take 1 tablet (40 mg total) by mouth daily.   meclizine (ANTIVERT) 25 MG tablet TAKE 1 TABLET(25 MG) BY MOUTH THREE TIMES DAILY AS NEEDED FOR DIZZINESS   No facility-administered encounter medications on file as of 09/02/2022.    Allergies (verified) Patient has no known allergies.   History: Past Medical History:  Diagnosis Date   Allergy    Arthritis    Hypertension    Past Surgical History:  Procedure Laterality Date   ABDOMINAL HYSTERECTOMY     breast biopsy Left    benign   CHOLECYSTECTOMY     Family History  Problem Relation Age of Onset   Stroke Brother    Alzheimer's disease Brother    Asthma Daughter    Diabetes Sister    Heart disease Sister    Mental illness Sister        depression, anxiety, resolved with treatment   Graves' disease Daughter    Colon cancer Neg Hx    Esophageal cancer Neg Hx    Rectal cancer Neg Hx    Stomach cancer Neg Hx    Social History   Socioeconomic History   Marital status: Married    Spouse name: ASheppard Coil  Number of children: 2   Years of education: HS   Highest education level: Not on file  Occupational History   Occupation: Deli Counter    Comment:  [employer]  Tobacco Use   Smoking status: Never   Smokeless tobacco: Never  Vaping Use   Vaping Use: Never used  Substance and Sexual Activity   Alcohol use: Yes    Alcohol/week: 0.0 standard drinks of alcohol    Comment: very seldom   Drug use: No   Sexual activity: Yes  Other Topics Concern   Not on file  Social History Narrative   1-2 sodas a day, daily consumption of tea   Lives with her husband and and their youngest daughter.   Social Determinants of Health   Financial Resource Strain: Low Risk  (09/02/2022)   Overall Financial Resource Strain (CARDIA)    Difficulty of  Paying Living Expenses: Not hard at all  Food Insecurity: No Food Insecurity (09/02/2022)   Hunger Vital Sign    Worried About Running Out of Food in the Last Year: Never true    Ran Out of Food in the Last Year: Never true  Transportation Needs: No Transportation Needs (09/02/2022)   PRAPARE - Hydrologist (Medical): No    Lack of Transportation (Non-Medical): No  Physical Activity: Sufficiently Active (09/02/2022)   Exercise Vital Sign    Days of Exercise per Week: 5 days    Minutes of Exercise per Session: 30 min  Stress: No Stress Concern Present (09/02/2022)   [place]    Feeling of Stress : Not at all  Social Connections: [place] (09/02/2022)   Social Connection and Isolation Panel [NHANES]    Frequency of Communication with Friends and Family: More than three times a week    Frequency of Social Gatherings with Friends and Family: More than three times a week    Attends Religious Services: More than 4 times per year    Active Member of [employer] or Organizations: Yes    Attends Music therapist: More than 4 times per year    Marital Status: Married    Tobacco Counseling Counseling given: Not Answered   Clinical Intake:  Pre-visit preparation completed: Yes  Pain : No/denies pain Pain Score: 0-No pain     BMI - recorded: 44.4 Nutritional Status: BMI > 30  Obese Nutritional Risks: None Diabetes: No  How often do you need to have someone help you when you read instructions, pamphlets, or other written materials from your doctor or pharmacy?: 1 - Never What is the last grade level you completed in school?: HSG  Diabetic? no  Interpreter Needed?: No  Information entered by :: Lisette Abu, LPN.   Activities of Daily Living    09/02/2022    3:26 PM  In your present state of health, do you have any difficulty performing the following activities:   Hearing? 0  Vision? 0  Difficulty concentrating or making decisions? 0  Walking or climbing stairs? 0  Dressing or bathing? 0  Doing errands, shopping? 0  Preparing Food and eating ? N  Using the Toilet? N  In the past six months, have you accidently leaked urine? N  Do you have problems with loss of bowel control? N  Managing your Medications? N  Managing your Finances? N  Housekeeping or managing your Housekeeping? N    Patient Care Team: Horald Pollen, MD as PCP - General (Internal Medicine) Everlene Farrier Loura Back, MD as Consulting Physician (Family Medicine)  Indicate any recent Medical Services you may have received from other than [facility] providers in the  past year (date may be approximate).     Assessment:   This is a routine wellness examination for Miranda [name].  Hearing/Vision screen Hearing Screening - Comments:: Denies hearing difficulties   Vision Screening - Comments:: Wears rx glasses - up to date with routine eye exams with [facility].   Dietary issues and exercise activities discussed: Current Exercise Habits: Home exercise routine, Type of exercise: walking, Time (Minutes): 30, Frequency (Times/Week): 5, Weekly Exercise (Minutes/Week): 150, Intensity: Moderate, Exercise limited by: None identified   Goals Addressed             This Visit's Progress    My goal for 2024 is to lose "alot" of weight.  Also change my eating habits.        Depression Screen    09/02/2022    3:25 PM 04/29/2022   10:47 AM 10/14/2021    2:46 PM 07/03/2020    8:06 AM 06/15/2020    2:40 PM 03/14/2020    8:48 AM 01/09/2020    2:30 PM  PHQ 2/9 Scores  PHQ - 2 Score 0 0 0 0 0 0 0  PHQ- 9 Score       1    Fall Risk    09/02/2022    3:19 PM 04/29/2022   10:47 AM 10/14/2021    2:46 PM 07/03/2020    8:06 AM 06/15/2020    2:40 PM  Fall Risk   Falls in the past year? 0 0 0 0 0  Number falls in past yr: 0 0 0 0 0  Injury with Fall? 0 0 0 0 0  Risk for fall due to : No Fall Risks No  Fall Risks     Follow up Falls prevention discussed Falls evaluation completed  Falls evaluation completed     FALL RISK PREVENTION PERTAINING TO THE HOME:  Any stairs in or around the home? No  If so, are there any without handrails? No  Home free of loose throw rugs in walkways, pet beds, electrical cords, etc? Yes  Adequate lighting in your home to reduce risk of falls? Yes   ASSISTIVE DEVICES UTILIZED TO PREVENT FALLS:  Life alert? No  Use of a cane, walker or w/c? No  Grab bars in the bathroom? No  Shower chair or bench in shower? Yes  Elevated toilet seat or a handicapped toilet? No   TIMED UP AND GO:  Was the test performed? No . Phone Visit   Cognitive Function:        09/02/2022    3:26 PM  6CIT Screen  What Year? 0 points  What month? 0 points  What time? 0 points  Count back from 20 0 points  Months in reverse 0 points  Repeat phrase 0 points  Total Score 0 points    Immunizations Immunization History  Administered Date(s) Administered   Fluad Quad(high Dose 65+) 06/15/2020, 10/14/2021   Influenza, High Dose Seasonal PF 05/17/2018, 05/26/2019   Influenza-Unspecified 08/05/2016   Moderna Sars-Covid-2 Vaccination 10/31/2019, 12/18/2019   Pneumococcal Conjugate-13 06/15/2020    TDAP status: Due, Education has been provided regarding the importance of this vaccine. Advised may receive this vaccine at local pharmacy or Health Dept. Aware to provide a copy of the vaccination record if obtained from local pharmacy or Health Dept. Verbalized acceptance and understanding.  Flu Vaccine status: Due, Education has been provided regarding the importance of this vaccine. Advised may receive this vaccine at local pharmacy or Health Dept. Aware  to provide a copy of the vaccination record if obtained from local pharmacy or Health Dept. Verbalized acceptance and understanding.  Pneumococcal vaccine status: Due, Education has been provided regarding the importance of this  vaccine. Advised may receive this vaccine at local pharmacy or Health Dept. Aware to provide a copy of the vaccination record if obtained from local pharmacy or Health Dept. Verbalized acceptance and understanding.  Covid-19 vaccine status: Completed vaccines  Qualifies for Shingles Vaccine? Yes   Zostavax completed No   Shingrix Completed?: No.    Education has been provided regarding the importance of this vaccine. Patient has been advised to call insurance company to determine out of pocket expense if they have not yet received this vaccine. Advised may also receive vaccine at local pharmacy or Health Dept. Verbalized acceptance and understanding.  Screening Tests Health Maintenance  Topic Date Due   DTaP/Tdap/Td (1 - Tdap) Never done   DEXA SCAN  Never done   Pneumonia Vaccine 68+ Years old (2 - PPSV23 or PCV20) 06/15/2021   COVID-19 Vaccine (3 - 2023-24 season) 04/25/2022   Zoster Vaccines- Shingrix (1 of 2) 10/28/2022 (Originally 01/27/1998)   INFLUENZA VACCINE  11/23/2022 (Originally 03/25/2022)   COLONOSCOPY (Pts 45-55yr Insurance coverage will need to be confirmed)  03/24/2023   Medicare Annual Wellness (AWV)  09/03/2023   MAMMOGRAM  11/27/2023   Hepatitis C Screening  Completed   HPV VACCINES  Aged Out    Health Maintenance  Health Maintenance Due  Topic Date Due   DTaP/Tdap/Td (1 - Tdap) Never done   DEXA SCAN  Never done   Pneumonia Vaccine 75 Years old (2 - PPSV23 or PCV20) 06/15/2021   COVID-19 Vaccine (3 - 2023-24 season) 04/25/2022    Colorectal cancer screening: Type of screening: Colonoscopy. Completed 03/23/2013. Repeat every 10 years  Mammogram status: Completed 11/26/2021. Repeat every year  Bone Density status: Never done  Lung Cancer Screening: (Low Dose CT Chest recommended if Age 75-80years, 30 pack-year currently smoking OR have quit w/in 15years.) does not qualify.   Lung Cancer Screening Referral: no  Additional Screening:  Hepatitis C Screening:  does qualify; Completed 03/14/2016  Vision Screening: Recommended annual ophthalmology exams for early detection of glaucoma and other disorders of the eye. Is the patient up to date with their annual eye exam?  Yes  Who is the provider or what is the name of the office in which the patient attends annual eye exams? [facility] [place] If pt is not established with a provider, would they like to be referred to a provider to establish care? No .   Dental Screening: Recommended annual dental exams for proper oral hygiene  Community Resource Referral / Chronic Care Management: CRR required this visit?  No   CCM required this visit?  No      Plan:     I have personally reviewed and noted the following in the patient's chart:   Medical and social history Use of alcohol, tobacco or illicit drugs  Current medications and supplements including opioid prescriptions. Patient is not currently taking opioid prescriptions. Functional ability and status Nutritional status Physical activity Advanced directives List of other physicians Hospitalizations, surgeries, and ER visits in previous 12 months Vitals Screenings to include cognitive, depression, and falls Referrals and appointments  In addition, I have reviewed and discussed with patient certain preventive protocols, quality metrics, and best practice recommendations. A written personalized care plan for preventive services as well as general preventive health recommendations  were provided to patient.     Sheral Flow, LPN   01/30/3418   Nurse Notes: N/A

## 2022-09-02 NOTE — Patient Instructions (Signed)
Miranda [name] , Thank you for taking time to come for your Medicare Wellness Visit. I appreciate your ongoing commitment to your health goals. Please review the following plan we discussed and let me know if I can assist you in the future.   These are the goals we discussed:  Goals      My goal for 2024 is to lose "alot" of weight.  Also change my eating habits.        This is a list of the screening recommended for you and due dates:  Health Maintenance  Topic Date Due   DTaP/Tdap/Td vaccine (1 - Tdap) Never done   DEXA scan (bone density measurement)  Never done   Pneumonia Vaccine (2 - PPSV23 or PCV20) 06/15/2021   COVID-19 Vaccine (3 - 2023-24 season) 04/25/2022   Zoster (Shingles) Vaccine (1 of 2) 10/28/2022*   Flu Shot  11/23/2022*   Colon Cancer Screening  03/24/2023   Medicare Annual Wellness Visit  09/03/2023   Mammogram  11/27/2023   Hepatitis C Screening: USPSTF Recommendation to screen - Ages 18-79 yo.  Completed   HPV Vaccine  Aged Out  *Topic was postponed. The date shown is not the original due date.    Advanced directives: No; Advance directive discussed with you today. Even though you declined this today please call our office should you change your mind and we can give you the proper paperwork for you to fill out.  Conditions/risks identified: Yes  Next appointment: Follow up in one year for your annual wellness visit.   Preventive Care 75 Years and Older, Female Preventive care refers to lifestyle choices and visits with your health care provider that can promote health and wellness. What does preventive care include? A yearly physical exam. This is also called an annual well check. Dental exams once or twice a year. Routine eye exams. Ask your health care provider how often you should have your eyes checked. Personal lifestyle choices, including: Daily care of your teeth and gums. Regular physical activity. Eating a healthy diet. Avoiding tobacco and drug  use. Limiting alcohol use. Practicing safe sex. Taking low-dose aspirin every day. Taking vitamin and mineral supplements as recommended by your health care provider. What happens during an annual well check? The services and screenings done by your health care provider during your annual well check will depend on your age, overall health, lifestyle risk factors, and family history of disease. Counseling  Your health care provider may ask you questions about your: Alcohol use. Tobacco use. Drug use. Emotional well-being. Home and relationship well-being. Sexual activity. Eating habits. History of falls. Memory and ability to understand (cognition). Work and work Statistician. Reproductive health. Screening  You may have the following tests or measurements: Height, weight, and BMI. Blood pressure. Lipid and cholesterol levels. These may be checked every 5 years, or more frequently if you are over 35 years old. Skin check. Lung cancer screening. You may have this screening every year starting at age 29 if you have a 30-pack-year history of smoking and currently smoke or have quit within the past 15 years. Fecal occult blood test (FOBT) of the stool. You may have this test every year starting at age 75. Flexible sigmoidoscopy or colonoscopy. You may have a sigmoidoscopy every 5 years or a colonoscopy every 10 years starting at age 60. Hepatitis C blood test. Hepatitis B blood test. Sexually transmitted disease (STD) testing. Diabetes screening. This is done by checking your blood sugar (glucose) after you  have not eaten for a while (fasting). You may have this done every 1-3 years. Bone density scan. This is done to screen for osteoporosis. You may have this done starting at age 75. Mammogram. This may be done every 1-2 years. Talk to your health care provider about how often you should have regular mammograms. Talk with your health care provider about your test results, treatment  options, and if necessary, the need for more tests. Vaccines  Your health care provider may recommend certain vaccines, such as: Influenza vaccine. This is recommended every year. Tetanus, diphtheria, and acellular pertussis (Tdap, Td) vaccine. You may need a Td booster every 10 years. Zoster vaccine. You may need this after age 80. Pneumococcal 13-valent conjugate (PCV13) vaccine. One dose is recommended after age 75. Pneumococcal polysaccharide (PPSV23) vaccine. One dose is recommended after age 75. Talk to your health care provider about which screenings and vaccines you need and how often you need them. This information is not intended to replace advice given to you by your health care provider. Make sure you discuss any questions you have with your health care provider. Document Released: 09/07/2015 Document Revised: 04/30/2016 Document Reviewed: 06/12/2015 Elsevier Interactive Patient Education  2017 [employer] Prevention in the Home Falls can cause injuries. They can happen to people of all ages. There are many things you can do to make your home safe and to help prevent falls. What can I do on the outside of my home? Regularly fix the edges of walkways and driveways and fix any cracks. Remove anything that might make you trip as you walk through a door, such as a raised step or threshold. Trim any bushes or trees on the path to your home. Use bright outdoor lighting. Clear any walking paths of anything that might make someone trip, such as rocks or tools. Regularly check to see if handrails are loose or broken. Make sure that both sides of any steps have handrails. Any raised decks and porches should have guardrails on the edges. Have any leaves, snow, or ice cleared regularly. Use sand or salt on walking paths during winter. Clean up any spills in your garage right away. This includes oil or grease spills. What can I do in the bathroom? Use night lights. Install grab  bars by the toilet and in the tub and shower. Do not use towel bars as grab bars. Use non-skid mats or decals in the tub or shower. If you need to sit down in the shower, use a plastic, non-slip stool. Keep the floor dry. Clean up any water that spills on the floor as soon as it happens. Remove soap buildup in the tub or shower regularly. Attach bath mats securely with double-sided non-slip rug tape. Do not have throw rugs and other things on the floor that can make you trip. What can I do in the bedroom? Use night lights. Make sure that you have a light by your bed that is easy to reach. Do not use any sheets or blankets that are too big for your bed. They should not hang down onto the floor. Have a firm chair that has side arms. You can use this for support while you get dressed. Do not have throw rugs and other things on the floor that can make you trip. What can I do in the kitchen? Clean up any spills right away. Avoid walking on wet floors. Keep items that you use a lot in easy-to-reach places. If you need  to reach something above you, use a strong step stool that has a grab bar. Keep electrical cords out of the way. Do not use floor polish or wax that makes floors slippery. If you must use wax, use non-skid floor wax. Do not have throw rugs and other things on the floor that can make you trip. What can I do with my stairs? Do not leave any items on the stairs. Make sure that there are handrails on both sides of the stairs and use them. Fix handrails that are broken or loose. Make sure that handrails are as long as the stairways. Check any carpeting to make sure that it is firmly attached to the stairs. Fix any carpet that is loose or worn. Avoid having throw rugs at the top or bottom of the stairs. If you do have throw rugs, attach them to the floor with carpet tape. Make sure that you have a light switch at the top of the stairs and the bottom of the stairs. If you do not have them,  ask someone to add them for you. What else can I do to help prevent falls? Wear shoes that: Do not have high heels. Have rubber bottoms. Are comfortable and fit you well. Are closed at the toe. Do not wear sandals. If you use a stepladder: Make sure that it is fully opened. Do not climb a closed stepladder. Make sure that both sides of the stepladder are locked into place. Ask someone to hold it for you, if possible. Clearly mark and make sure that you can see: Any grab bars or handrails. First and last steps. Where the edge of each step is. Use tools that help you move around (mobility aids) if they are needed. These include: Canes. Walkers. Scooters. Crutches. Turn on the lights when you go into a dark area. Replace any light bulbs as soon as they burn out. Set up your furniture so you have a clear path. Avoid moving your furniture around. If any of your floors are uneven, fix them. If there are any pets around you, be aware of where they are. Review your medicines with your doctor. Some medicines can make you feel dizzy. This can increase your chance of falling. Ask your doctor what other things that you can do to help prevent falls. This information is not intended to replace advice given to you by your health care provider. Make sure you discuss any questions you have with your health care provider. Document Released: 06/07/2009 Document Revised: 01/17/2016 Document Reviewed: 09/15/2014 Elsevier Interactive Patient Education  2017 [employer].

## 2022-10-21 DIAGNOSIS — Z1231 Encounter for screening mammogram for malignant neoplasm of breast: Secondary | ICD-10-CM | POA: Diagnosis not present

## 2022-10-21 LAB — HM MAMMOGRAPHY

## 2023-01-01 ENCOUNTER — Other Ambulatory Visit: Payer: Self-pay | Admitting: Emergency Medicine

## 2023-01-01 DIAGNOSIS — Z8739 Personal history of other diseases of the musculoskeletal system and connective tissue: Secondary | ICD-10-CM

## 2023-01-02 ENCOUNTER — Telehealth: Payer: Self-pay | Admitting: Emergency Medicine

## 2023-01-02 NOTE — Telephone Encounter (Signed)
Patient states that the medication listed in message was incorrect. Allopurinol was what needed to be requested. Provider sent medication to pharmacy yesterday

## 2023-01-02 NOTE — Telephone Encounter (Signed)
Prescription Request  01/02/2023  LOV: 04/29/2022  What is the name of the medication or equipment? lisonipril  Have you contacted your pharmacy to request a refill? Yes   Which pharmacy would you like this sent to?  [id number] [place], [place] - [address] AT [facility] [address] [facility] [address]  [place] [address] Phone: [phone] Fax: [phone]    Patient notified that their request is being sent to the clinical staff for review and that they should receive a response within 2 business days.   Please advise at Mobile [phone] (mobile)

## 2023-01-29 ENCOUNTER — Other Ambulatory Visit: Payer: Self-pay | Admitting: Emergency Medicine

## 2023-01-29 DIAGNOSIS — I10 Essential (primary) hypertension: Secondary | ICD-10-CM

## 2023-03-12 DIAGNOSIS — L609 Nail disorder, unspecified: Secondary | ICD-10-CM | POA: Diagnosis not present

## 2023-04-13 ENCOUNTER — Telehealth: Payer: Self-pay | Admitting: Emergency Medicine

## 2023-04-13 NOTE — Telephone Encounter (Signed)
Pt called stating she need a referral sent back where she can get her C pak materials. The last referral I seen was [facility]. Please advise.

## 2023-04-14 NOTE — Telephone Encounter (Signed)
Called patient and informed her that she needed to be seen in order for a referral to be sent. Appt with provider on 8/27

## 2023-04-21 ENCOUNTER — Encounter: Payer: Self-pay | Admitting: Emergency Medicine

## 2023-04-21 ENCOUNTER — Ambulatory Visit (INDEPENDENT_AMBULATORY_CARE_PROVIDER_SITE_OTHER): Payer: No Typology Code available for payment source | Admitting: Emergency Medicine

## 2023-04-21 VITALS — BP 124/80 | HR 59 | Temp 98.4°F | Wt 244.2 lb

## 2023-04-21 DIAGNOSIS — Z8739 Personal history of other diseases of the musculoskeletal system and connective tissue: Secondary | ICD-10-CM | POA: Diagnosis not present

## 2023-04-21 DIAGNOSIS — R29818 Other symptoms and signs involving the nervous system: Secondary | ICD-10-CM | POA: Diagnosis not present

## 2023-04-21 DIAGNOSIS — R7303 Prediabetes: Secondary | ICD-10-CM

## 2023-04-21 DIAGNOSIS — I10 Essential (primary) hypertension: Secondary | ICD-10-CM | POA: Diagnosis not present

## 2023-04-21 DIAGNOSIS — Z23 Encounter for immunization: Secondary | ICD-10-CM | POA: Diagnosis not present

## 2023-04-21 DIAGNOSIS — Z13820 Encounter for screening for osteoporosis: Secondary | ICD-10-CM | POA: Diagnosis not present

## 2023-04-21 DIAGNOSIS — N1832 Chronic kidney disease, stage 3b: Secondary | ICD-10-CM

## 2023-04-21 DIAGNOSIS — Z1211 Encounter for screening for malignant neoplasm of colon: Secondary | ICD-10-CM

## 2023-04-21 LAB — LIPID PANEL
Cholesterol: 183 mg/dL (ref 0–200)
HDL: 56.9 mg/dL (ref >39.00)
LDL Cholesterol: 107 mg/dL — ABNORMAL HIGH (ref 0–99)
NonHDL: 126.43
Total CHOL/HDL Ratio: 3
Triglycerides: 97 mg/dL (ref 0.0–149.0)
VLDL: 19.4 mg/dL (ref 0.0–40.0)

## 2023-04-21 LAB — CBC WITH DIFFERENTIAL/PLATELET
Basophils Absolute: 0 10*3/uL (ref 0.0–0.1)
Basophils Relative: 0.4 % (ref 0.0–3.0)
Eosinophils Absolute: 0.2 10*3/uL (ref 0.0–0.7)
Eosinophils Relative: 2.3 % (ref 0.0–5.0)
HCT: 40.1 % (ref 36.0–46.0)
Hemoglobin: 12.7 g/dL (ref 12.0–15.0)
Lymphocytes Relative: 25.5 % (ref 12.0–46.0)
Lymphs Abs: 2.2 10*3/uL (ref 0.7–4.0)
MCHC: 31.8 g/dL (ref 30.0–36.0)
MCV: 78.8 fl (ref 78.0–100.0)
Monocytes Absolute: 0.4 10*3/uL (ref 0.1–1.0)
Monocytes Relative: 5.1 % (ref 3.0–12.0)
Neutro Abs: 5.8 10*3/uL (ref 1.4–7.7)
Neutrophils Relative %: 66.7 % (ref 43.0–77.0)
Platelets: 251 10*3/uL (ref 150.0–400.0)
RBC: 5.09 Mil/uL (ref 3.87–5.11)
RDW: 16.2 % — ABNORMAL HIGH (ref 11.5–15.5)
WBC: 8.7 10*3/uL (ref 4.0–10.5)

## 2023-04-21 LAB — COMPREHENSIVE METABOLIC PANEL
ALT: 11 U/L (ref 0–35)
AST: 16 U/L (ref 0–37)
Albumin: 3.8 g/dL (ref 3.5–5.2)
Alkaline Phosphatase: 107 U/L (ref 39–117)
BUN: 23 mg/dL (ref 6–23)
CO2: 31 mEq/L (ref 19–32)
Calcium: 9.9 mg/dL (ref 8.4–10.5)
Chloride: 102 mEq/L (ref 96–112)
Creatinine, Ser: 1.44 mg/dL — ABNORMAL HIGH (ref 0.40–1.20)
GFR: 35.65 mL/min — ABNORMAL LOW (ref >60.00)
Glucose, Bld: 114 mg/dL — ABNORMAL HIGH (ref 70–99)
Potassium: 3.8 mEq/L (ref 3.5–5.1)
Sodium: 141 mEq/L (ref 135–145)
Total Bilirubin: 0.4 mg/dL (ref 0.2–1.2)
Total Protein: 7.6 g/dL (ref 6.0–8.3)

## 2023-04-21 LAB — URIC ACID: Uric Acid, Serum: 5.5 mg/dL (ref 2.4–7.0)

## 2023-04-21 LAB — HEMOGLOBIN A1C: Hgb A1c MFr Bld: 6.3 % (ref 4.6–6.5)

## 2023-04-21 NOTE — Assessment & Plan Note (Signed)
Chronic stable condition Continues Farxiga 10 mg daily and lisinopril 40 mg daily Advised to stay well-hydrated and avoid NSAIDs

## 2023-04-21 NOTE — Assessment & Plan Note (Signed)
Diet and nutrition discussed Cardiovascular risks associated with obesity discussed Advised to decrease amount of daily carbohydrate intake and daily calories and increase amount of plant-based protein in her diet

## 2023-04-21 NOTE — Progress Notes (Signed)
Miranda [name] 77 y.o.   Chief Complaint  Patient presents with   Medical Management of Chronic Issues    Follow up appt, possible referral for sleep study, CPAP machine    HISTORY OF PRESENT ILLNESS: This is a 77 y.o. female here for follow-up of chronic medical conditions Needs referral for sleep study Otherwise doing well.  No complaints or any other medical concerns today.  HPI   Prior to Admission medications   Medication Sig Start Date End Date Taking? Authorizing Provider  allopurinol (ZYLOPRIM) 100 MG tablet TAKE 1/2 TABLET BY MOUTH DAILY 01/01/23  Yes Laverna Dossett, Eilleen Kempf, MD  atenolol-chlorthalidone (TENORETIC) 50-25 MG tablet Take 1 tablet by mouth daily. 04/29/22 04/24/23 Yes Azuri Bozard, Eilleen Kempf, MD  FARXIGA 10 MG TABS tablet TAKE 1 TABLET BY MOUTH DAILY Oral for 90 Days   Yes [provider]  lisinopril (ZESTRIL) 40 MG tablet Take 1 tablet (40 mg total) by mouth daily. Overdue for annual appt w/labs must see provider for future refills 01/29/23  Yes Nikki Rusnak, Eilleen Kempf, MD  albuterol (VENTOLIN HFA) 108 (90 Base) MCG/ACT inhaler Inhale 2 puffs into the lungs every 4 (four) hours as needed for wheezing or shortness of breath (cough, shortness of breath or wheezing.). Patient not taking: Reported on 04/21/2023 10/14/21   Georgina Quint, MD  meclizine (ANTIVERT) 25 MG tablet TAKE 1 TABLET(25 MG) BY MOUTH THREE TIMES DAILY AS NEEDED FOR DIZZINESS Patient not taking: Reported on 04/21/2023 01/22/21   Georgina Quint, MD    No Known Allergies  Patient Active Problem List   Diagnosis Date Noted   History of gout 10/14/2021   Morbid obesity (HCC) 07/09/2020   Stage 3b chronic kidney disease (HCC) 07/09/2020   Environmental allergies 03/27/2018   BMI 45.0-49.9, adult (HCC) 03/14/2016   Pre-diabetes 02/28/2015   HTN (hypertension) 01/17/2012    Past Medical History:  Diagnosis Date   Allergy    Arthritis    Hypertension     Past Surgical History:   Procedure Laterality Date   ABDOMINAL HYSTERECTOMY     breast biopsy Left    benign   CHOLECYSTECTOMY      Social History   Socioeconomic History   Marital status: Married    Spouse name: Lyn Hollingshead   Number of children: 2   Years of education: HS   Highest education level: Not on file  Occupational History   Occupation: Deli Counter    Comment: [employer]  Tobacco Use   Smoking status: Never   Smokeless tobacco: Never  Vaping Use   Vaping status: Never Used  Substance and Sexual Activity   Alcohol use: Yes    Alcohol/week: 0.0 standard drinks of alcohol    Comment: very seldom   Drug use: No   Sexual activity: Yes  Other Topics Concern   Not on file  Social History Narrative   1-2 sodas a day, daily consumption of tea   Lives with her husband and and their youngest daughter.   Social Determinants of Health   Financial Resource Strain: Low Risk  (09/02/2022)   Overall Financial Resource Strain (CARDIA)    Difficulty of Paying Living Expenses: Not hard at all  Food Insecurity: No Food Insecurity (09/02/2022)   Hunger Vital Sign    Worried About Running Out of Food in the Last Year: Never true    Ran Out of Food in the Last Year: Never true  Transportation Needs: No Transportation Needs (09/02/2022)   PRAPARE -  Administrator, Civil Service (Medical): No    Lack of Transportation (Non-Medical): No  Physical Activity: Sufficiently Active (09/02/2022)   Exercise Vital Sign    Days of Exercise per Week: 5 days    Minutes of Exercise per Session: 30 min  Stress: No Stress Concern Present (09/02/2022)   [employer] of Occupational Health - Occupational Stress Questionnaire    Feeling of Stress : Not at all  Social Connections: Socially Integrated (09/02/2022)   Social Connection and Isolation Panel [NHANES]    Frequency of Communication with Friends and Family: More than three times a week    Frequency of Social Gatherings with Friends and Family: More than  three times a week    Attends Religious Services: More than 4 times per year    Active Member of [employer] or Organizations: Yes    Attends Engineer, structural: More than 4 times per year    Marital Status: Married  Catering manager Violence: Not At Risk (09/02/2022)   Humiliation, Afraid, Rape, and Kick questionnaire    Fear of Current or Ex-Partner: No    Emotionally Abused: No    Physically Abused: No    Sexually Abused: No    Family History  Problem Relation Age of Onset   Stroke Brother    Alzheimer's disease Brother    Asthma Daughter    Diabetes Sister    Heart disease Sister    Mental illness Sister        depression, anxiety, resolved with treatment   Graves' disease Daughter    Colon cancer Neg Hx    Esophageal cancer Neg Hx    Rectal cancer Neg Hx    Stomach cancer Neg Hx      Review of Systems  Constitutional: Negative.  Negative for chills and fever.  HENT: Negative.  Negative for congestion and sore throat.   Respiratory: Negative.  Negative for cough and shortness of breath.   Cardiovascular: Negative.  Negative for chest pain and palpitations.  Gastrointestinal:  Negative for abdominal pain, diarrhea, nausea and vomiting.  Genitourinary: Negative.  Negative for dysuria.  Skin: Negative.  Negative for rash.  Neurological: Negative.  Negative for dizziness and headaches.  All other systems reviewed and are negative.   Vitals:   04/21/23 0945  BP: 124/80  Pulse: (!) 59  Temp: 98.4 F (36.9 C)  SpO2: 95%    Physical Exam Vitals reviewed.  Constitutional:      Appearance: Normal appearance. She is obese.  HENT:     Head: Normocephalic.     Mouth/Throat:     Mouth: Mucous membranes are moist.     Pharynx: Oropharynx is clear.  Eyes:     Extraocular Movements: Extraocular movements intact.     Conjunctiva/sclera: Conjunctivae normal.     Pupils: Pupils are equal, round, and reactive to light.  Cardiovascular:     Rate and Rhythm: Normal  rate and regular rhythm.     Pulses: Normal pulses.     Heart sounds: Normal heart sounds.  Pulmonary:     Effort: Pulmonary effort is normal.     Breath sounds: Normal breath sounds.  Abdominal:     Palpations: Abdomen is soft.     Tenderness: There is no abdominal tenderness.  Musculoskeletal:     Cervical back: No tenderness.  Lymphadenopathy:     Cervical: No cervical adenopathy.  Skin:    General: Skin is warm and dry.     Capillary  Refill: Capillary refill takes less than 2 seconds.  Neurological:     General: No focal deficit present.     Mental Status: She is alert and oriented to person, place, and time.  Psychiatric:        Mood and Affect: Mood normal.        Behavior: Behavior normal.      ASSESSMENT & PLAN: A total of 46 minutes was spent with the patient and counseling/coordination of care regarding preparing for this visit, review of most recent office visit notes, review of multiple chronic medical conditions under management, review of most recent blood work results, review of all medications, education on nutrition, review of health maintenance items, prognosis, documentation, and need for follow-up.  Problem List Items Addressed This Visit       Cardiovascular and Mediastinum   HTN (hypertension) - Primary    BP Readings from Last 3 Encounters:  04/21/23 124/80  04/29/22 138/78  04/10/22 (!) 162/87  Well-controlled hypertension Continue lisinopril 40 mg and Tenoretic 50-25 mg daily Cardiovascular risks associated with hypertension discussed Diet and nutrition discussed Blood work done today Follow-up in 6 months       Relevant Orders   CBC with Differential/Platelet   Comprehensive metabolic panel   Lipid panel     Genitourinary   Stage 3b chronic kidney disease (HCC)    Chronic stable condition Continues Farxiga 10 mg daily and lisinopril 40 mg daily Advised to stay well-hydrated and avoid NSAIDs        Other   Pre-diabetes    Diet and  nutrition discussed Hemoglobin A1c done today Advised to decrease amount of daily carbohydrate intake and daily calories and increase amount of plant-based protein in her diet      Relevant Orders   Hemoglobin A1c   Morbid obesity (HCC)    Diet and nutrition discussed Cardiovascular risks associated with obesity discussed Advised to decrease amount of daily carbohydrate intake and daily calories and increase amount of plant-based protein in her diet       Relevant Medications   FARXIGA 10 MG TABS tablet   Other Relevant Orders   Lipid panel   History of gout    No recent flareups Continue allopurinol 50 mg daily      Relevant Orders   Uric acid   Other Visit Diagnoses     Suspected sleep apnea       Relevant Orders   Ambulatory referral to Sleep Studies   Need for vaccination       Relevant Orders   Pneumococcal conjugate vaccine 20-valent (Prevnar 20) (Completed)   Flu vaccine trivalent PF, 6mos and older(Flulaval,Afluria,Fluarix,Fluzone) (Completed)   Screening for colon cancer       Relevant Orders   Ambulatory referral to Gastroenterology   Osteoporosis screening       Relevant Orders   DG Bone Density      Patient Instructions  Health Maintenance After Age 75 After age 30, you are at a higher risk for certain long-term diseases and infections as well as injuries from falls. Falls are a major cause of broken bones and head injuries in people who are older than age 26. Getting regular preventive care can help to keep you healthy and well. Preventive care includes getting regular testing and making lifestyle changes as recommended by your health care provider. Talk with your health care provider about: Which screenings and tests you should have. A screening is a test that checks for a disease  when you have no symptoms. A diet and exercise plan that is right for you. What should I know about screenings and tests to prevent falls? Screening and testing are the best  ways to find a health problem early. Early diagnosis and treatment give you the best chance of managing medical conditions that are common after age 46. Certain conditions and lifestyle choices may make you more likely to have a fall. Your health care provider may recommend: Regular vision checks. Poor vision and conditions such as cataracts can make you more likely to have a fall. If you wear glasses, make sure to get your prescription updated if your vision changes. Medicine review. Work with your health care provider to regularly review all of the medicines you are taking, including over-the-counter medicines. Ask your health care provider about any side effects that may make you more likely to have a fall. Tell your health care provider if any medicines that you take make you feel dizzy or sleepy. Strength and balance checks. Your health care provider may recommend certain tests to check your strength and balance while standing, walking, or changing positions. Foot health exam. Foot pain and numbness, as well as not wearing proper footwear, can make you more likely to have a fall. Screenings, including: Osteoporosis screening. Osteoporosis is a condition that causes the bones to get weaker and break more easily. Blood pressure screening. Blood pressure changes and medicines to control blood pressure can make you feel dizzy. Depression screening. You may be more likely to have a fall if you have a fear of falling, feel depressed, or feel unable to do activities that you used to do. Alcohol use screening. Using too much alcohol can affect your balance and may make you more likely to have a fall. Follow these instructions at home: Lifestyle Do not drink alcohol if: Your health care provider tells you not to drink. If you drink alcohol: Limit how much you have to: 0-1 drink a day for women. 0-2 drinks a day for men. Know how much alcohol is in your drink. In the [place] one drink equals one 12 oz  bottle of beer (355 mL), one 5 oz glass of wine (148 mL), or one 1 oz glass of hard liquor (44 mL). Do not use any products that contain nicotine or tobacco. These products include cigarettes, chewing tobacco, and vaping devices, such as e-cigarettes. If you need help quitting, ask your health care provider. Activity  Follow a regular exercise program to stay fit. This will help you maintain your balance. Ask your health care provider what types of exercise are appropriate for you. If you need a cane or walker, use it as recommended by your health care provider. Wear supportive shoes that have nonskid soles. Safety  Remove any tripping hazards, such as rugs, cords, and clutter. Install safety equipment such as grab bars in bathrooms and safety rails on stairs. Keep rooms and walkways well-lit. General instructions Talk with your health care provider about your risks for falling. Tell your health care provider if: You fall. Be sure to tell your health care provider about all falls, even ones that seem minor. You feel dizzy, tiredness (fatigue), or off-balance. Take over-the-counter and prescription medicines only as told by your health care provider. These include supplements. Eat a healthy diet and maintain a healthy weight. A healthy diet includes low-fat dairy products, low-fat (lean) meats, and fiber from whole grains, beans, and lots of fruits and vegetables. Stay current with your vaccines.  Schedule regular health, dental, and eye exams. Summary Having a healthy lifestyle and getting preventive care can help to protect your health and wellness after age 32. Screening and testing are the best way to find a health problem early and help you avoid having a fall. Early diagnosis and treatment give you the best chance for managing medical conditions that are more common for people who are older than age 75. Falls are a major cause of broken bones and head injuries in people who are older than  age 97. Take precautions to prevent a fall at home. Work with your health care provider to learn what changes you can make to improve your health and wellness and to prevent falls. This information is not intended to replace advice given to you by your health care provider. Make sure you discuss any questions you have with your health care provider. Document Revised: 12/31/2020 Document Reviewed: 12/31/2020 Elsevier Patient Education  2024 Elsevier Inc.     Edwina Barth, MD [facility]

## 2023-04-21 NOTE — Assessment & Plan Note (Signed)
No recent flareups Continue allopurinol 50 mg daily

## 2023-04-21 NOTE — Assessment & Plan Note (Signed)
Diet and nutrition discussed Hemoglobin A1c done today. Advised to decrease amount of daily carbohydrate intake and daily calories and increase amount of plant-based protein in her diet. 

## 2023-04-21 NOTE — Assessment & Plan Note (Signed)
BP Readings from Last 3 Encounters:  04/21/23 124/80  04/29/22 138/78  04/10/22 (!) 162/87  Well-controlled hypertension Continue lisinopril 40 mg and Tenoretic 50-25 mg daily Cardiovascular risks associated with hypertension discussed Diet and nutrition discussed Blood work done today Follow-up in 6 months

## 2023-04-21 NOTE — Patient Instructions (Signed)
Health Maintenance After Age 75 After age 75, you are at a higher risk for certain long-term diseases and infections as well as injuries from falls. Falls are a major cause of broken bones and head injuries in people who are older than age 75. Getting regular preventive care can help to keep you healthy and well. Preventive care includes getting regular testing and making lifestyle changes as recommended by your health care provider. Talk with your health care provider about: Which screenings and tests you should have. A screening is a test that checks for a disease when you have no symptoms. A diet and exercise plan that is right for you. What should I know about screenings and tests to prevent falls? Screening and testing are the best ways to find a health problem early. Early diagnosis and treatment give you the best chance of managing medical conditions that are common after age 75. Certain conditions and lifestyle choices may make you more likely to have a fall. Your health care provider may recommend: Regular vision checks. Poor vision and conditions such as cataracts can make you more likely to have a fall. If you wear glasses, make sure to get your prescription updated if your vision changes. Medicine review. Work with your health care provider to regularly review all of the medicines you are taking, including over-the-counter medicines. Ask your health care provider about any side effects that may make you more likely to have a fall. Tell your health care provider if any medicines that you take make you feel dizzy or sleepy. Strength and balance checks. Your health care provider may recommend certain tests to check your strength and balance while standing, walking, or changing positions. Foot health exam. Foot pain and numbness, as well as not wearing proper footwear, can make you more likely to have a fall. Screenings, including: Osteoporosis screening. Osteoporosis is a condition that causes  the bones to get weaker and break more easily. Blood pressure screening. Blood pressure changes and medicines to control blood pressure can make you feel dizzy. Depression screening. You may be more likely to have a fall if you have a fear of falling, feel depressed, or feel unable to do activities that you used to do. Alcohol use screening. Using too much alcohol can affect your balance and may make you more likely to have a fall. Follow these instructions at home: Lifestyle Do not drink alcohol if: Your health care provider tells you not to drink. If you drink alcohol: Limit how much you have to: 0-1 drink a day for women. 0-2 drinks a day for men. Know how much alcohol is in your drink. In the [place] one drink equals one 12 oz bottle of beer (355 mL), one 5 oz glass of wine (148 mL), or one 1 oz glass of hard liquor (44 mL). Do not use any products that contain nicotine or tobacco. These products include cigarettes, chewing tobacco, and vaping devices, such as e-cigarettes. If you need help quitting, ask your health care provider. Activity  Follow a regular exercise program to stay fit. This will help you maintain your balance. Ask your health care provider what types of exercise are appropriate for you. If you need a cane or walker, use it as recommended by your health care provider. Wear supportive shoes that have nonskid soles. Safety  Remove any tripping hazards, such as rugs, cords, and clutter. Install safety equipment such as grab bars in bathrooms and safety rails on stairs. Keep rooms and walkways   well-lit. General instructions Talk with your health care provider about your risks for falling. Tell your health care provider if: You fall. Be sure to tell your health care provider about all falls, even ones that seem minor. You feel dizzy, tiredness (fatigue), or off-balance. Take over-the-counter and prescription medicines only as told by your health care provider. These include  supplements. Eat a healthy diet and maintain a healthy weight. A healthy diet includes low-fat dairy products, low-fat (lean) meats, and fiber from whole grains, beans, and lots of fruits and vegetables. Stay current with your vaccines. Schedule regular health, dental, and eye exams. Summary Having a healthy lifestyle and getting preventive care can help to protect your health and wellness after age 75. Screening and testing are the best way to find a health problem early and help you avoid having a fall. Early diagnosis and treatment give you the best chance for managing medical conditions that are more common for people who are older than age 75. Falls are a major cause of broken bones and head injuries in people who are older than age 75. Take precautions to prevent a fall at home. Work with your health care provider to learn what changes you can make to improve your health and wellness and to prevent falls. This information is not intended to replace advice given to you by your health care provider. Make sure you discuss any questions you have with your health care provider. Document Revised: 12/31/2020 Document Reviewed: 12/31/2020 Elsevier Patient Education  2024 Elsevier Inc.  

## 2023-05-04 ENCOUNTER — Other Ambulatory Visit: Payer: Self-pay | Admitting: Emergency Medicine

## 2023-05-04 DIAGNOSIS — I10 Essential (primary) hypertension: Secondary | ICD-10-CM

## 2023-05-08 ENCOUNTER — Encounter: Payer: Self-pay | Admitting: Internal Medicine

## 2023-05-19 ENCOUNTER — Encounter: Payer: Self-pay | Admitting: *Deleted

## 2023-05-27 ENCOUNTER — Other Ambulatory Visit: Payer: Self-pay

## 2023-05-27 ENCOUNTER — Ambulatory Visit (AMBULATORY_SURGERY_CENTER): Payer: No Typology Code available for payment source

## 2023-05-27 VITALS — Ht 61.0 in | Wt 235.0 lb

## 2023-05-27 DIAGNOSIS — Z1211 Encounter for screening for malignant neoplasm of colon: Secondary | ICD-10-CM

## 2023-05-27 MED ORDER — NA SULFATE-K SULFATE-MG SULF 17.5-3.13-1.6 GM/177ML PO SOLN
1.0000 | Freq: Once | ORAL | 0 refills | Status: DC
Start: 2023-05-27 — End: 2023-06-02

## 2023-05-27 NOTE — Progress Notes (Signed)
Denies allergies to eggs or soy products. Denies complication of anesthesia or sedation. Denies use of weight loss medication. Denies use of O2.   Emmi instructions given for colonoscopy.  

## 2023-05-29 ENCOUNTER — Other Ambulatory Visit: Payer: Self-pay | Admitting: Pharmacist

## 2023-05-29 NOTE — Progress Notes (Signed)
Pharmacy Quality Measure Review  This patient is appearing on a report for being at risk of failing the adherence measure for diabetes medications this calendar year.   Medication: Farxiga 10 mg daily Last fill date: 04/17/2023 for 90 day supply  Spoke to patient. She gets [place] through patient assistance now through Dr. Beaulah Corin office. Informed pt she will need to re-enroll for the new year.  Arbutus Leas, PharmD, BCPS [facility] [phone]

## 2023-06-02 ENCOUNTER — Telehealth: Payer: Self-pay | Admitting: Internal Medicine

## 2023-06-02 ENCOUNTER — Other Ambulatory Visit: Payer: Self-pay

## 2023-06-02 DIAGNOSIS — Z1211 Encounter for screening for malignant neoplasm of colon: Secondary | ICD-10-CM

## 2023-06-02 MED ORDER — NA SULFATE-K SULFATE-MG SULF 17.5-3.13-1.6 GM/177ML PO SOLN: 1.0000 | Freq: Once | ORAL | 0 refills | Status: AC

## 2023-06-02 NOTE — Telephone Encounter (Signed)
Can you please provide this patient with prep instructions. I don't see instruction in this patients chart. Thank you

## 2023-06-02 NOTE — Telephone Encounter (Signed)
Inbound call from patient stating she has not received any prep instructions through the mail. States she also has not received coupon for prep medication. Patient requesting a call. Please advise, thank you.

## 2023-06-02 NOTE — Telephone Encounter (Signed)
Informed pt that her RX has been resent to Pharmacy and a copy of instructions have been sent to her My Chart as well as one coming in the mail. Good RX coupon sent visa pt's email and cell phone. No other questions at this time per pt.

## 2023-06-09 ENCOUNTER — Encounter: Payer: Self-pay | Admitting: Internal Medicine

## 2023-06-10 ENCOUNTER — Encounter: Payer: Self-pay | Admitting: Internal Medicine

## 2023-06-10 ENCOUNTER — Ambulatory Visit: Payer: No Typology Code available for payment source | Admitting: Internal Medicine

## 2023-06-10 VITALS — BP 101/59 | HR 62 | Temp 96.9°F | Resp 18 | Ht 61.0 in | Wt 235.0 lb

## 2023-06-10 DIAGNOSIS — D122 Benign neoplasm of ascending colon: Secondary | ICD-10-CM

## 2023-06-10 DIAGNOSIS — D120 Benign neoplasm of cecum: Secondary | ICD-10-CM | POA: Diagnosis not present

## 2023-06-10 DIAGNOSIS — G4730 Sleep apnea, unspecified: Secondary | ICD-10-CM | POA: Diagnosis not present

## 2023-06-10 DIAGNOSIS — Z1211 Encounter for screening for malignant neoplasm of colon: Secondary | ICD-10-CM

## 2023-06-10 DIAGNOSIS — I10 Essential (primary) hypertension: Secondary | ICD-10-CM | POA: Diagnosis not present

## 2023-06-10 MED ORDER — SODIUM CHLORIDE 0.9 % IV SOLN: 500.0000 mL | Freq: Once | INTRAVENOUS | Status: DC

## 2023-06-10 NOTE — Patient Instructions (Signed)
Resume previous diet and medications. Awaiting pathology results. Repeat Colonoscopy date to be determined based on pathology results. Handouts provided on Colon polyps, Diverticulosis and Hemorrhoids   YOU HAD AN ENDOSCOPIC PROCEDURE TODAY AT THE [facility]:   Refer to the procedure report that was given to you for any specific questions about what was found during the examination.  If the procedure report does not answer your questions, please call your gastroenterologist to clarify.  If you requested that your care partner not be given the details of your procedure findings, then the procedure report has been included in a sealed envelope for you to review at your convenience later.  YOU SHOULD EXPECT: Some feelings of bloating in the abdomen. Passage of more gas than usual.  Walking can help get rid of the air that was put into your GI tract during the procedure and reduce the bloating. If you had a lower endoscopy (such as a colonoscopy or flexible sigmoidoscopy) you may notice spotting of blood in your stool or on the toilet paper. If you underwent a bowel prep for your procedure, you may not have a normal bowel movement for a few days.  Please Note:  You might notice some irritation and congestion in your nose or some drainage.  This is from the oxygen used during your procedure.  There is no need for concern and it should clear up in a day or so.  SYMPTOMS TO REPORT IMMEDIATELY:  Following lower endoscopy (colonoscopy or flexible sigmoidoscopy):  Excessive amounts of blood in the stool  Significant tenderness or worsening of abdominal pains  Swelling of the abdomen that is new, acute  Fever of 100F or higher  For urgent or emergent issues, a gastroenterologist can be reached at any hour by calling [phone]. Do not use MyChart messaging for urgent concerns.    DIET:  We do recommend a small meal at first, but then you may proceed to your regular diet.  Drink plenty of  fluids but you should avoid alcoholic beverages for 24 hours.  ACTIVITY:  You should plan to take it easy for the rest of today and you should NOT DRIVE or use heavy machinery until tomorrow (because of the sedation medicines used during the test).    FOLLOW UP: Our staff will call the number listed on your records the next business day following your procedure.  We will call around 7:15- 8:00 am to check on you and address any questions or concerns that you may have regarding the information given to you following your procedure. If we do not reach you, we will leave a message.     If any biopsies were taken you will be contacted by phone or by letter within the next 1-3 weeks.  Please call us at [phone] if you have not heard about the biopsies in 3 weeks.    SIGNATURES/CONFIDENTIALITY: You and/or your care partner have signed paperwork which will be entered into your electronic medical record.  These signatures attest to the fact that that the information above on your After Visit Summary has been reviewed and is understood.  Full responsibility of the confidentiality of this discharge information lies with you and/or your care-partner.

## 2023-06-10 NOTE — Progress Notes (Signed)
GASTROENTEROLOGY PROCEDURE H&P NOTE   Primary Care Physician: Georgina Quint, MD    Reason for Procedure:   Colon cancer screening  Plan:    Colonoscopy  Patient is appropriate for endoscopic procedure(s) in the ambulatory ([facility]) setting.  The nature of the procedure, as well as the risks, benefits, and alternatives were carefully and thoroughly reviewed with the patient. Ample time for discussion and questions allowed. The patient understood, was satisfied, and agreed to proceed.     HPI: Miranda [name] is a 77 y.o. female who presents for colonoscopy for colon cancer screening. Denies blood in stools, changes in bowel habits, or unintentional weight loss. Denies family history of colon cancer.  Past Medical History:  Diagnosis Date   Allergy    Arthritis    GERD (gastroesophageal reflux disease)    Hypertension    Sleep apnea     Past Surgical History:  Procedure Laterality Date   ABDOMINAL HYSTERECTOMY     breast biopsy Left    benign   CHOLECYSTECTOMY      Prior to Admission medications   Medication Sig Start Date End Date Taking? Authorizing Provider  allopurinol (ZYLOPRIM) 100 MG tablet TAKE 1/2 TABLET BY MOUTH DAILY 01/01/23  Yes Sagardia, Eilleen Kempf, MD  atenolol-chlorthalidone (TENORETIC) 50-25 MG tablet TAKE 1 TABLET BY MOUTH DAILY 05/04/23 04/28/24 Yes Sagardia, Eilleen Kempf, MD  FARXIGA 10 MG TABS tablet TAKE 1 TABLET BY MOUTH DAILY Oral for 90 Days   Yes [provider]  lisinopril (ZESTRIL) 40 MG tablet Take 1 tablet (40 mg total) by mouth daily. Overdue for annual appt w/labs must see provider for future refills 01/29/23  Yes Sagardia, Eilleen Kempf, MD  albuterol (VENTOLIN HFA) 108 (90 Base) MCG/ACT inhaler Inhale 2 puffs into the lungs every 4 (four) hours as needed for wheezing or shortness of breath (cough, shortness of breath or wheezing.). Patient not taking: Reported on 06/10/2023 10/14/21   Georgina Quint, MD  meclizine (ANTIVERT) 25  MG tablet TAKE 1 TABLET(25 MG) BY MOUTH THREE TIMES DAILY AS NEEDED FOR DIZZINESS 01/22/21   Georgina Quint, MD    Current Outpatient Medications  Medication Sig Dispense Refill   allopurinol (ZYLOPRIM) 100 MG tablet TAKE 1/2 TABLET BY MOUTH DAILY 135 tablet 0   atenolol-chlorthalidone (TENORETIC) 50-25 MG tablet TAKE 1 TABLET BY MOUTH DAILY 90 tablet 3   FARXIGA 10 MG TABS tablet TAKE 1 TABLET BY MOUTH DAILY Oral for 90 Days     lisinopril (ZESTRIL) 40 MG tablet Take 1 tablet (40 mg total) by mouth daily. Overdue for annual appt w/labs must see provider for future refills 30 tablet 0   albuterol (VENTOLIN HFA) 108 (90 Base) MCG/ACT inhaler Inhale 2 puffs into the lungs every 4 (four) hours as needed for wheezing or shortness of breath (cough, shortness of breath or wheezing.). (Patient not taking: Reported on 06/10/2023) 18 g 3   meclizine (ANTIVERT) 25 MG tablet TAKE 1 TABLET(25 MG) BY MOUTH THREE TIMES DAILY AS NEEDED FOR DIZZINESS 30 tablet 0   Current Facility-Administered Medications  Medication Dose Route Frequency Provider Last Rate Last Admin   0.9 %  sodium chloride infusion  500 mL Intravenous Once Imogene Burn, MD        Allergies as of 06/10/2023   (No Known Allergies)    Family History  Problem Relation Age of Onset   Stroke Brother    Alzheimer's disease Brother    Asthma Daughter  Diabetes Sister    Heart disease Sister    Mental illness Sister        depression, anxiety, resolved with treatment   Graves' disease Daughter    Colon cancer Neg Hx    Esophageal cancer Neg Hx    Rectal cancer Neg Hx    Stomach cancer Neg Hx     Social History   Socioeconomic History   Marital status: Married    Spouse name: Lyn Hollingshead   Number of children: 2   Years of education: HS   Highest education level: Not on file  Occupational History   Occupation: Deli Counter    Comment: [employer]  Tobacco Use   Smoking status: Never   Smokeless tobacco: Never   Vaping Use   Vaping status: Never Used  Substance and Sexual Activity   Alcohol use: Yes    Alcohol/week: 0.0 standard drinks of alcohol    Comment: very seldom   Drug use: No   Sexual activity: Yes  Other Topics Concern   Not on file  Social History Narrative   1-2 sodas a day, daily consumption of tea   Lives with her husband and and their youngest daughter.   Social Determinants of Health   Financial Resource Strain: Low Risk  (09/02/2022)   Overall Financial Resource Strain (CARDIA)    Difficulty of Paying Living Expenses: Not hard at all  Food Insecurity: No Food Insecurity (09/02/2022)   Hunger Vital Sign    Worried About Running Out of Food in the Last Year: Never true    Ran Out of Food in the Last Year: Never true  Transportation Needs: No Transportation Needs (09/02/2022)   PRAPARE - Administrator, Civil Service (Medical): No    Lack of Transportation (Non-Medical): No  Physical Activity: Sufficiently Active (09/02/2022)   Exercise Vital Sign    Days of Exercise per Week: 5 days    Minutes of Exercise per Session: 30 min  Stress: No Stress Concern Present (09/02/2022)   [employer] of Occupational Health - Occupational Stress Questionnaire    Feeling of Stress : Not at all  Social Connections: Socially Integrated (09/02/2022)   Social Connection and Isolation Panel [NHANES]    Frequency of Communication with Friends and Family: More than three times a week    Frequency of Social Gatherings with Friends and Family: More than three times a week    Attends Religious Services: More than 4 times per year    Active Member of [employer] or Organizations: Yes    Attends Engineer, structural: More than 4 times per year    Marital Status: Married  Catering manager Violence: Not At Risk (09/02/2022)   Humiliation, Afraid, Rape, and Kick questionnaire    Fear of Current or Ex-Partner: No    Emotionally Abused: No    Physically Abused: No    Sexually Abused: No     Physical Exam: Vital signs in last 24 hours: BP (!) 146/96   Pulse 75   Temp (!) 96.9 F (36.1 C)   Ht 5\' 1"  (1.549 m)   Wt 235 lb (106.6 kg)   SpO2 98%   BMI 44.40 kg/m  GEN: NAD EYE: Sclerae anicteric ENT: MMM CV: Non-tachycardic Pulm: No increased work of breathing GI: Soft, NT/ND NEURO:  Alert & Oriented   Eulah Pont, MD [facility]  06/10/2023 11:52 AM

## 2023-06-10 NOTE — Progress Notes (Signed)
Called to room to assist during endoscopic procedure.  Patient ID and intended procedure confirmed with present staff. Received instructions for my participation in the procedure from the performing physician.  

## 2023-06-10 NOTE — Progress Notes (Signed)
Vss nad trans to pacu 

## 2023-06-10 NOTE — Progress Notes (Signed)
Pt's states no medical or surgical changes since previsit or office visit. 

## 2023-06-10 NOTE — Op Note (Signed)
[facility] Patient Name: Mumina Highbaugh Procedure Date: 06/10/2023 12:04 PM MRN: [id number] Endoscopist: Madelyn Brunner [place] , , [id number] Age: 77 Referring MD:  Date of Birth: Oct 06, 1945 Gender: Female Account #: [id number] Procedure:                Colonoscopy Indications:              Screening for colorectal malignant neoplasm Medicines:                Monitored Anesthesia Care Procedure:                Pre-Anesthesia Assessment:                           - Prior to the procedure, a History and Physical                            was performed, and patient medications and                            allergies were reviewed. The patient's tolerance of                            previous anesthesia was also reviewed. The risks                            and benefits of the procedure and the sedation                            options and risks were discussed with the patient.                            All questions were answered, and informed consent                            was obtained. Prior Anticoagulants: The patient has                            taken no anticoagulant or antiplatelet agents. ASA                            Grade Assessment: III - A patient with severe                            systemic disease. After reviewing the risks and                            benefits, the patient was deemed in satisfactory                            condition to undergo the procedure.                           After obtaining informed consent, the colonoscope  was passed under direct vision. Throughout the                            procedure, the patient's blood pressure, pulse, and                            oxygen saturations were monitored continuously. The                            Olympus Scope SN: [id number] was introduced through                            the anus and advanced to the the terminal ileum.                            The  colonoscopy was performed without difficulty.                            The patient tolerated the procedure well. The                            quality of the bowel preparation was excellent. The                            terminal ileum, ileocecal valve, appendiceal                            orifice, and rectum were photographed. Scope In: 12:09:02 PM Scope Out: 12:18:55 PM Scope Withdrawal Time: 0 hours 7 minutes 4 seconds  Total Procedure Duration: 0 hours 9 minutes 53 seconds  Findings:                 The terminal ileum appeared normal.                           Two sessile polyps were found in the ascending                            colon and cecum. The polyps were 3 to 6 mm in size.                            These polyps were removed with a cold snare.                            Resection and retrieval were complete.                           Multiple diverticula were found in the sigmoid                            colon, descending colon and transverse colon.                           Non-bleeding internal hemorrhoids were found during  retroflexion. Complications:            No immediate complications. Estimated Blood Loss:     Estimated blood loss was minimal. Impression:               - The examined portion of the ileum was normal.                           - Two 3 to 6 mm polyps in the ascending colon and                            in the cecum, removed with a cold snare. Resected                            and retrieved.                           - Diverticulosis in the sigmoid colon, in the                            descending colon and in the transverse colon.                           - Non-bleeding internal hemorrhoids. Recommendation:           - Discharge patient to home (with escort).                           - Await pathology results.                           - The findings and recommendations were discussed                             with the patient. Dr Particia Lather "Alan Ripper" Leonides Schanz,  06/10/2023 12:21:50 PM

## 2023-06-11 ENCOUNTER — Telehealth: Payer: Self-pay

## 2023-06-11 NOTE — Telephone Encounter (Signed)
Attempted to reach patient for post-procedure f/u call. No answer. Left message for her to please not hesitate to call if she has any questions/concerns regarding her care.

## 2023-06-12 ENCOUNTER — Encounter: Payer: Self-pay | Admitting: Internal Medicine

## 2023-06-12 LAB — SURGICAL PATHOLOGY

## 2023-07-25 ENCOUNTER — Other Ambulatory Visit: Payer: Self-pay | Admitting: Emergency Medicine

## 2023-07-25 DIAGNOSIS — I10 Essential (primary) hypertension: Secondary | ICD-10-CM

## 2023-07-29 DIAGNOSIS — N2581 Secondary hyperparathyroidism of renal origin: Secondary | ICD-10-CM | POA: Diagnosis not present

## 2023-07-29 DIAGNOSIS — N1831 Chronic kidney disease, stage 3a: Secondary | ICD-10-CM | POA: Diagnosis not present

## 2023-07-29 DIAGNOSIS — E669 Obesity, unspecified: Secondary | ICD-10-CM | POA: Diagnosis not present

## 2023-07-29 DIAGNOSIS — R7303 Prediabetes: Secondary | ICD-10-CM | POA: Diagnosis not present

## 2023-07-29 DIAGNOSIS — I129 Hypertensive chronic kidney disease with stage 1 through stage 4 chronic kidney disease, or unspecified chronic kidney disease: Secondary | ICD-10-CM | POA: Diagnosis not present

## 2023-07-29 DIAGNOSIS — M109 Gout, unspecified: Secondary | ICD-10-CM | POA: Diagnosis not present

## 2023-07-30 LAB — LAB REPORT - SCANNED
Albumin, Urine POC: 6.4
EGFR: 42
Microalb Creat Ratio: 11

## 2023-09-01 ENCOUNTER — Ambulatory Visit (INDEPENDENT_AMBULATORY_CARE_PROVIDER_SITE_OTHER)
Admission: RE | Admit: 2023-09-01 | Discharge: 2023-09-01 | Disposition: A | Discharge status: Home / Self Care | Payer: Medicare PPO | Source: Ambulatory Visit | Attending: Emergency Medicine | Admitting: Emergency Medicine

## 2023-09-01 DIAGNOSIS — Z13820 Encounter for screening for osteoporosis: Secondary | ICD-10-CM | POA: Diagnosis not present

## 2023-10-16 ENCOUNTER — Ambulatory Visit: Payer: Medicare PPO

## 2023-10-19 ENCOUNTER — Ambulatory Visit (INDEPENDENT_AMBULATORY_CARE_PROVIDER_SITE_OTHER): Payer: Medicare PPO

## 2023-10-19 VITALS — Ht 61.0 in | Wt 235.0 lb

## 2023-10-19 DIAGNOSIS — Z0000 Encounter for general adult medical examination without abnormal findings: Secondary | ICD-10-CM | POA: Diagnosis not present

## 2023-10-19 NOTE — Patient Instructions (Signed)
 Miranda [name] , Thank you for taking time to come for your Medicare Wellness Visit. I appreciate your ongoing commitment to your health goals. Please review the following plan we discussed and let me know if I can assist you in the future.   Referrals/Orders/Follow-Ups/Clinician Recommendations: It was nice talking to you today.  Remember to bring in your Covid card to document shots in your chart.  Aim for 30 minutes of exercise or brisk walking, 6-8 glasses of water, and 5 servings of fruits and vegetables each day.   This is a list of the screening recommended for you and due dates:  Health Maintenance  Topic Date Due   DTaP/Tdap/Td vaccine (1 - Tdap) Never done   COVID-19 Vaccine (3 - 2024-25 season) 04/26/2023   Zoster (Shingles) Vaccine (1 of 2) 10/22/2023*   Medicare Annual Wellness Visit  10/18/2024   Pneumonia Vaccine  Completed   Flu Shot  Completed   DEXA scan (bone density measurement)  Completed   Hepatitis C Screening  Completed   HPV Vaccine  Aged Out   Colon Cancer Screening  Discontinued  *Topic was postponed. The date shown is not the original due date.    Advanced directives: (Declined) Advance directive discussed with you today. Even though you declined this today, please call our office should you change your mind, and we can give you the proper paperwork for you to fill out.  Next Medicare Annual Wellness Visit scheduled for next year: Yes

## 2023-10-19 NOTE — Progress Notes (Signed)
 Subjective:   Miranda [name] is a 78 y.o. female who presents for Medicare Annual (Subsequent) preventive examination.  Visit Complete: Virtual I connected with  Miranda [name] on 10/19/23 by a audio enabled telemedicine application and verified that I am speaking with the correct person using two identifiers.  Patient Location: Home  Provider Location: Home Office  I discussed the limitations of evaluation and management by telemedicine. The patient expressed understanding and agreed to proceed.  Vital Signs: Because this visit was a virtual/telehealth visit, some criteria may be missing or patient reported. Any vitals not documented were not able to be obtained and vitals that have been documented are patient reported.   Cardiac Risk Factors include: advanced age (>50men, >27 women);hypertension;Other (see comment), Risk factor comments: CKD     Objective:    Today's Vitals   10/19/23 1309  Weight: 235 lb (106.6 kg)   Body mass index is 44.4 kg/m.     10/19/2023    1:16 PM 09/02/2022    3:19 PM 04/09/2022   10:41 PM  Advanced Directives  Does Patient Have a Medical Advance Directive? No No No  Would patient like information on creating a medical advance directive? No - Patient declined No - Patient declined     Current Medications (verified) Outpatient Encounter Medications as of 10/19/2023  Medication Sig   albuterol (VENTOLIN HFA) 108 (90 Base) MCG/ACT inhaler Inhale 2 puffs into the lungs every 4 (four) hours as needed for wheezing or shortness of breath (cough, shortness of breath or wheezing.).   allopurinol (ZYLOPRIM) 100 MG tablet TAKE 1/2 TABLET BY MOUTH DAILY   atenolol-chlorthalidone (TENORETIC) 50-25 MG tablet TAKE 1 TABLET BY MOUTH DAILY   FARXIGA 10 MG TABS tablet TAKE 1 TABLET BY MOUTH DAILY Oral for 90 Days   lisinopril (ZESTRIL) 40 MG tablet TAKE 1 TABLET(40 MG) BY MOUTH DAILY   meclizine (ANTIVERT) 25 MG tablet TAKE 1 TABLET(25 MG) BY MOUTH THREE TIMES DAILY  AS NEEDED FOR DIZZINESS   No facility-administered encounter medications on file as of 10/19/2023.    Allergies (verified) Patient has no known allergies.   History: Past Medical History:  Diagnosis Date   Allergy    Arthritis    GERD (gastroesophageal reflux disease)    Hypertension    Sleep apnea    Past Surgical History:  Procedure Laterality Date   ABDOMINAL HYSTERECTOMY     breast biopsy Left    benign   CHOLECYSTECTOMY     Family History  Problem Relation Age of Onset   Stroke Brother    Alzheimer's disease Brother    Asthma Daughter    Diabetes Sister    Heart disease Sister    Mental illness Sister        depression, anxiety, resolved with treatment   Graves' disease Daughter    Colon cancer Neg Hx    Esophageal cancer Neg Hx    Rectal cancer Neg Hx    Stomach cancer Neg Hx    Social History   Socioeconomic History   Marital status: Widowed    Spouse name: Lyn Hollingshead   Number of children: 2   Years of education: HS   Highest education level: Not on file  Occupational History   Occupation: Deli Counter    Comment: [employer]  Tobacco Use   Smoking status: Never   Smokeless tobacco: Never  Vaping Use   Vaping status: Never Used  Substance and Sexual Activity   Alcohol use: Yes  Alcohol/week: 0.0 standard drinks of alcohol    Comment: very seldom   Drug use: No   Sexual activity: Yes  Other Topics Concern   Not on file  Social History Narrative   1-2 sodas a day, daily consumption of tea   Lives with her  youngest daughter.   Social Drivers of Corporate investment banker Strain: Low Risk  (10/19/2023)   Overall Financial Resource Strain (CARDIA)    Difficulty of Paying Living Expenses: Not very hard  Food Insecurity: No Food Insecurity (10/19/2023)   Hunger Vital Sign    Worried About Running Out of Food in the Last Year: Never true    Ran Out of Food in the Last Year: Never true  Transportation Needs: No Transportation Needs  (10/19/2023)   PRAPARE - Administrator, Civil Service (Medical): No    Lack of Transportation (Non-Medical): No  Physical Activity: Inactive (10/19/2023)   Exercise Vital Sign    Days of Exercise per Week: 0 days    Minutes of Exercise per Session: 0 min  Stress: No Stress Concern Present (10/19/2023)   [employer] of Occupational Health - Occupational Stress Questionnaire    Feeling of Stress : Not at all  Social Connections: Moderately Isolated (10/19/2023)   Social Connection and Isolation Panel [NHANES]    Frequency of Communication with Friends and Family: More than three times a week    Frequency of Social Gatherings with Friends and Family: Never    Attends Religious Services: 1 to 4 times per year    Active Member of [employer] or Organizations: No    Attends Banker Meetings: Never    Marital Status: Widowed    Tobacco Counseling Counseling given: Not Answered   Clinical Intake:  Pre-visit preparation completed: Yes  Pain : No/denies pain     BMI - recorded: 44.4 Nutritional Status: BMI > 30  Obese Nutritional Risks: None Diabetes: No  How often do you need to have someone help you when you read instructions, pamphlets, or other written materials from your doctor or pharmacy?: 1 - Never  Interpreter Needed?: No  Information entered by :: Kaipo Ardis, RMA   Activities of Daily Living    10/19/2023    1:10 PM  In your present state of health, do you have any difficulty performing the following activities:  Hearing? 0  Vision? 0  Difficulty concentrating or making decisions? 0  Walking or climbing stairs? 0  Dressing or bathing? 0  Doing errands, shopping? 0  Preparing Food and eating ? N  Using the Toilet? N  In the past six months, have you accidently leaked urine? N  Do you have problems with loss of bowel control? N  Managing your Medications? N  Managing your Finances? N  Housekeeping or managing your Housekeeping? N     Patient Care Team: Georgina Quint, MD as PCP - General (Internal Medicine) Cleta Alberts Maylon Peppers, MD as Consulting Physician (Family Medicine)  Indicate any recent Medical Services you may have received from other than [facility] providers in the past year (date may be approximate).     Assessment:   This is a routine wellness examination for Miranda [name].  Hearing/Vision screen Hearing Screening - Comments:: Denies hearing difficulties   Vision Screening - Comments:: Wears eyeglasses   Goals Addressed               This Visit's Progress     My goal for 2024 is to  lose "alot" of weight.  Also change my eating habits. (pt-stated)        She has cut back on juice and soda/drinks more water      Depression Screen    10/19/2023    1:20 PM 04/21/2023    9:42 AM 09/02/2022    3:25 PM 04/29/2022   10:47 AM 10/14/2021    2:46 PM 07/03/2020    8:06 AM 06/15/2020    2:40 PM  PHQ 2/9 Scores  PHQ - 2 Score 3 0 0 0 0 0 0  PHQ- 9 Score 3          Fall Risk    10/19/2023    1:17 PM 04/21/2023    9:42 AM 09/02/2022    3:19 PM 04/29/2022   10:47 AM 10/14/2021    2:46 PM  Fall Risk   Falls in the past year? 0 0 0 0 0  Number falls in past yr: 0 0 0 0 0  Injury with Fall? 0 0 0 0 0  Risk for fall due to : No Fall Risks No Fall Risks No Fall Risks No Fall Risks   Follow up Falls prevention discussed;Falls evaluation completed Falls evaluation completed Falls prevention discussed Falls evaluation completed     MEDICARE RISK AT HOME: Medicare Risk at Home Any stairs in or around the home?: No Home free of loose throw rugs in walkways, pet beds, electrical cords, etc?: Yes Adequate lighting in your home to reduce risk of falls?: Yes Life alert?: No Use of a cane, walker or w/c?: No Grab bars in the bathroom?: No Shower chair or bench in shower?: Yes (does not use it) Elevated toilet seat or a handicapped toilet?: Yes  TIMED UP AND GO:  Was the test performed?  No    Cognitive Function:         10/19/2023    1:11 PM 09/02/2022    3:26 PM  6CIT Screen  What Year? 0 points 0 points  What month? 0 points 0 points  What time? 0 points 0 points  Count back from 20 0 points 0 points  Months in reverse 0 points 0 points  Repeat phrase 4 points 0 points  Total Score 4 points 0 points    Immunizations Immunization History  Administered Date(s) Administered   Fluad Quad(high Dose 65+) 06/15/2020, 10/14/2021   Influenza, High Dose Seasonal PF 05/17/2018, 05/26/2019   Influenza, Seasonal, Injecte, Preservative Fre 04/21/2023   Influenza-Unspecified 08/05/2016   Moderna Sars-Covid-2 Vaccination 10/31/2019, 12/18/2019   PNEUMOCOCCAL CONJUGATE-20 04/21/2023   Pneumococcal Conjugate-13 06/15/2020   Tdap 05/27/2023    TDAP status: Up to date  Flu Vaccine status: Up to date  Pneumococcal vaccine status: Up to date  Covid-19 vaccine status: Completed vaccines  Qualifies for Shingles Vaccine? Yes   Zostavax completed Yes   Shingrix Completed?: No.    Education has been provided regarding the importance of this vaccine. Patient has been advised to call insurance company to determine out of pocket expense if they have not yet received this vaccine. Advised may also receive vaccine at local pharmacy or Health Dept. Verbalized acceptance and understanding.  Screening Tests Health Maintenance  Topic Date Due   COVID-19 Vaccine (3 - 2024-25 season) 04/26/2023   Zoster Vaccines- Shingrix (1 of 2) 10/22/2023 (Originally 01/[birth year]/1998)   Medicare Annual Wellness (AWV)  10/18/2024   DTaP/Tdap/Td (2 - Td or Tdap) 05/26/2033   Pneumonia Vaccine 67+ Years old  Completed   INFLUENZA  VACCINE  Completed   DEXA SCAN  Completed   Hepatitis C Screening  Completed   HPV VACCINES  Aged Out   Colonoscopy  Discontinued    Health Maintenance  Health Maintenance Due  Topic Date Due   COVID-19 Vaccine (3 - 2024-25 season) 04/26/2023    Colorectal cancer screening: Type of screening:  Colonoscopy. Completed 06/10/2023. Repeat every N/A years  Mammogram status: Completed 10/21/2022. Repeat every year  Bone Density status: Completed 09/01/2023. Results reflect: Bone density results: NORMAL. Repeat every 3-5 years.  Lung Cancer Screening: (Low Dose CT Chest recommended if Age 43-80 years, 20 pack-year currently smoking OR have quit w/in 15years.) does not qualify.   Lung Cancer Screening Referral: N/A  Additional Screening:  Hepatitis C Screening: does qualify; Completed 03/14/2016  Vision Screening: Recommended annual ophthalmology exams for early detection of glaucoma and other disorders of the eye. Is the patient up to date with their annual eye exam?  Yes  Who is the provider or what is the name of the office in which the patient attends annual eye exams? [facility] on [address] If pt is not established with a provider, would they like to be referred to a provider to establish care? No .   Dental Screening: Recommended annual dental exams for proper oral hygiene   Community Resource Referral / Chronic Care Management: CRR required this visit?  No   CCM required this visit?  No     Plan:     I have personally reviewed and noted the following in the patient's chart:   Medical and social history Use of alcohol, tobacco or illicit drugs  Current medications and supplements including opioid prescriptions. Patient is not currently taking opioid prescriptions. Functional ability and status Nutritional status Physical activity Advanced directives List of other physicians Hospitalizations, surgeries, and ER visits in previous 12 months Vitals Screenings to include cognitive, depression, and falls Referrals and appointments  In addition, I have reviewed and discussed with patient certain preventive protocols, quality metrics, and best practice recommendations. A written personalized care plan for preventive services as well as general preventive health  recommendations were provided to patient.     Burnice Vassel L Briann Sarchet, CMA   10/19/2023   After Visit Summary: (MyChart) Due to this being a telephonic visit, the after visit summary with patients personalized plan was offered to patient via MyChart   Nurse Notes: Patient is due for Shingles vaccine. She stated that she will be receiving notice of her appointment soon from [facility].  Patient had no other concerns to address today.

## 2023-10-22 ENCOUNTER — Encounter: Payer: Self-pay | Admitting: Emergency Medicine

## 2023-10-22 ENCOUNTER — Ambulatory Visit (INDEPENDENT_AMBULATORY_CARE_PROVIDER_SITE_OTHER): Payer: Medicare PPO | Admitting: Emergency Medicine

## 2023-10-22 VITALS — BP 128/80 | HR 74 | Temp 98.2°F | Ht 61.0 in | Wt 239.0 lb

## 2023-10-22 DIAGNOSIS — N1832 Chronic kidney disease, stage 3b: Secondary | ICD-10-CM

## 2023-10-22 DIAGNOSIS — R7303 Prediabetes: Secondary | ICD-10-CM

## 2023-10-22 DIAGNOSIS — I10 Essential (primary) hypertension: Secondary | ICD-10-CM

## 2023-10-22 LAB — COMPREHENSIVE METABOLIC PANEL
ALT: 18 U/L (ref 0–35)
AST: 22 U/L (ref 0–37)
Albumin: 3.8 g/dL (ref 3.5–5.2)
Alkaline Phosphatase: 103 U/L (ref 39–117)
BUN: 28 mg/dL — ABNORMAL HIGH (ref 6–23)
CO2: 30 meq/L (ref 19–32)
Calcium: 9.5 mg/dL (ref 8.4–10.5)
Chloride: 103 meq/L (ref 96–112)
Creatinine, Ser: 1.64 mg/dL — ABNORMAL HIGH (ref 0.40–1.20)
GFR: 30.39 mL/min — ABNORMAL LOW (ref >60.00)
Glucose, Bld: 125 mg/dL — ABNORMAL HIGH (ref 70–99)
Potassium: 3.7 meq/L (ref 3.5–5.1)
Sodium: 141 meq/L (ref 135–145)
Total Bilirubin: 0.3 mg/dL (ref 0.2–1.2)
Total Protein: 7.6 g/dL (ref 6.0–8.3)

## 2023-10-22 LAB — CBC WITH DIFFERENTIAL/PLATELET
Basophils Absolute: 0 10*3/uL (ref 0.0–0.1)
Basophils Relative: 0.2 % (ref 0.0–3.0)
Eosinophils Absolute: 0.2 10*3/uL (ref 0.0–0.7)
Eosinophils Relative: 1.9 % (ref 0.0–5.0)
HCT: 40.3 % (ref 36.0–46.0)
Hemoglobin: 13 g/dL (ref 12.0–15.0)
Lymphocytes Relative: 29.5 % (ref 12.0–46.0)
Lymphs Abs: 2.5 10*3/uL (ref 0.7–4.0)
MCHC: 32.2 g/dL (ref 30.0–36.0)
MCV: 79.4 fl (ref 78.0–100.0)
Monocytes Absolute: 0.5 10*3/uL (ref 0.1–1.0)
Monocytes Relative: 5.6 % (ref 3.0–12.0)
Neutro Abs: 5.3 10*3/uL (ref 1.4–7.7)
Neutrophils Relative %: 62.8 % (ref 43.0–77.0)
Platelets: 262 10*3/uL (ref 150.0–400.0)
RBC: 5.08 Mil/uL (ref 3.87–5.11)
RDW: 16.3 % — ABNORMAL HIGH (ref 11.5–15.5)
WBC: 8.5 10*3/uL (ref 4.0–10.5)

## 2023-10-22 LAB — LIPID PANEL
Cholesterol: 163 mg/dL (ref 0–200)
HDL: 59.2 mg/dL (ref >39.00)
LDL Cholesterol: 89 mg/dL (ref 0–99)
NonHDL: 104.17
Total CHOL/HDL Ratio: 3
Triglycerides: 76 mg/dL (ref 0.0–149.0)
VLDL: 15.2 mg/dL (ref 0.0–40.0)

## 2023-10-22 LAB — HEMOGLOBIN A1C: Hgb A1c MFr Bld: 6.5 % (ref 4.6–6.5)

## 2023-10-22 NOTE — Assessment & Plan Note (Signed)
Diet and nutrition discussed Hemoglobin A1c done today. Advised to decrease amount of daily carbohydrate intake and daily calories and increase amount of plant-based protein in her diet. 

## 2023-10-22 NOTE — Progress Notes (Signed)
 Miranda [name] 78 y.o.   Chief Complaint  Patient presents with   Follow-up    6 month f/u for HTN. No other concerns     HISTORY OF PRESENT ILLNESS: This is a 79 y.o. female here for follow-up of chronic medical conditions including hypertension Overall doing well.  Has no complaints or medical concerns today. BP Readings from Last 3 Encounters:  06/10/23 (!) 101/59  04/21/23 124/80  04/29/22 138/78   Wt Readings from Last 3 Encounters:  10/19/23 235 lb (106.6 kg)  06/10/23 235 lb (106.6 kg)  05/27/23 235 lb (106.6 kg)     HPI   Prior to Admission medications   Medication Sig Start Date End Date Taking? Authorizing Provider  albuterol (VENTOLIN HFA) 108 (90 Base) MCG/ACT inhaler Inhale 2 puffs into the lungs every 4 (four) hours as needed for wheezing or shortness of breath (cough, shortness of breath or wheezing.). 10/14/21  Yes Velda Wendt, Eilleen Kempf, MD  allopurinol (ZYLOPRIM) 100 MG tablet TAKE 1/2 TABLET BY MOUTH DAILY 01/01/23  Yes Kalan Rinn, Eilleen Kempf, MD  atenolol-chlorthalidone (TENORETIC) 50-25 MG tablet TAKE 1 TABLET BY MOUTH DAILY 05/04/23 04/28/24 Yes Adraine Biffle, Eilleen Kempf, MD  FARXIGA 10 MG TABS tablet TAKE 1 TABLET BY MOUTH DAILY Oral for 90 Days   Yes [provider]  lisinopril (ZESTRIL) 40 MG tablet TAKE 1 TABLET(40 MG) BY MOUTH DAILY 07/26/23  Yes Jacquelynne Guedes, Eilleen Kempf, MD  meclizine (ANTIVERT) 25 MG tablet TAKE 1 TABLET(25 MG) BY MOUTH THREE TIMES DAILY AS NEEDED FOR DIZZINESS 01/22/21  Yes Georgina Quint, MD    No Known Allergies  Patient Active Problem List   Diagnosis Date Noted   History of gout 10/14/2021   Morbid obesity (HCC) 07/09/2020   Stage 3b chronic kidney disease (HCC) 07/09/2020   Environmental allergies 03/27/2018   BMI 45.0-49.9, adult (HCC) 03/14/2016   Pre-diabetes 02/28/2015   HTN (hypertension) 01/17/2012    Past Medical History:  Diagnosis Date   Allergy    Arthritis    GERD (gastroesophageal reflux disease)     Hypertension    Sleep apnea     Past Surgical History:  Procedure Laterality Date   ABDOMINAL HYSTERECTOMY     breast biopsy Left    benign   CHOLECYSTECTOMY      Social History   Socioeconomic History   Marital status: Widowed    Spouse name: Lyn Hollingshead   Number of children: 2   Years of education: HS   Highest education level: Not on file  Occupational History   Occupation: Deli Counter    Comment: [employer]  Tobacco Use   Smoking status: Never   Smokeless tobacco: Never  Vaping Use   Vaping status: Never Used  Substance and Sexual Activity   Alcohol use: Yes    Alcohol/week: 0.0 standard drinks of alcohol    Comment: very seldom   Drug use: No   Sexual activity: Yes  Other Topics Concern   Not on file  Social History Narrative   1-2 sodas a day, daily consumption of tea   Lives with her  youngest daughter.   Social Drivers of Corporate investment banker Strain: Low Risk  (10/19/2023)   Overall Financial Resource Strain (CARDIA)    Difficulty of Paying Living Expenses: Not very hard  Food Insecurity: No Food Insecurity (10/19/2023)   Hunger Vital Sign    Worried About Running Out of Food in the Last Year: Never true    Ran Out  of Food in the Last Year: Never true  Transportation Needs: No Transportation Needs (10/19/2023)   PRAPARE - Administrator, Civil Service (Medical): No    Lack of Transportation (Non-Medical): No  Physical Activity: Inactive (10/19/2023)   Exercise Vital Sign    Days of Exercise per Week: 0 days    Minutes of Exercise per Session: 0 min  Stress: No Stress Concern Present (10/19/2023)   [employer] of Occupational Health - Occupational Stress Questionnaire    Feeling of Stress : Not at all  Social Connections: Moderately Isolated (10/19/2023)   Social Connection and Isolation Panel [NHANES]    Frequency of Communication with Friends and Family: More than three times a week    Frequency of Social Gatherings with  Friends and Family: Never    Attends Religious Services: 1 to 4 times per year    Active Member of [employer] or Organizations: No    Attends Banker Meetings: Never    Marital Status: Widowed  Intimate Partner Violence: Patient Unable To Answer (10/19/2023)   Humiliation, Afraid, Rape, and Kick questionnaire    Fear of Current or Ex-Partner: Patient unable to answer    Emotionally Abused: Patient unable to answer    Physically Abused: Patient unable to answer    Sexually Abused: Patient unable to answer    Family History  Problem Relation Age of Onset   Stroke Brother    Alzheimer's disease Brother    Asthma Daughter    Diabetes Sister    Heart disease Sister    Mental illness Sister        depression, anxiety, resolved with treatment   Graves' disease Daughter    Colon cancer Neg Hx    Esophageal cancer Neg Hx    Rectal cancer Neg Hx    Stomach cancer Neg Hx      Review of Systems  Constitutional: Negative.  Negative for chills and fever.  HENT: Negative.  Negative for congestion and sore throat.   Respiratory: Negative.  Negative for cough and shortness of breath.   Cardiovascular: Negative.  Negative for chest pain and palpitations.  Gastrointestinal:  Negative for abdominal pain, diarrhea, nausea and vomiting.  Genitourinary: Negative.  Negative for dysuria and hematuria.  Skin: Negative.  Negative for rash.  Neurological: Negative.  Negative for dizziness and headaches.  All other systems reviewed and are negative.   Today's Vitals   10/22/23 0932  BP: 128/80  Pulse: 74  Temp: 98.2 F (36.8 C)  TempSrc: Oral  SpO2: 98%  Weight: 239 lb (108.4 kg)  Height: 5\' 1"  (1.549 m)   Body mass index is 45.16 kg/m.   Physical Exam Vitals reviewed.  Constitutional:      Appearance: Normal appearance. She is obese.  HENT:     Head: Normocephalic.     Mouth/Throat:     Mouth: Mucous membranes are moist.     Pharynx: Oropharynx is clear.  Eyes:      Extraocular Movements: Extraocular movements intact.     Pupils: Pupils are equal, round, and reactive to light.  Cardiovascular:     Rate and Rhythm: Normal rate and regular rhythm.     Pulses: Normal pulses.     Heart sounds: Normal heart sounds.  Pulmonary:     Effort: Pulmonary effort is normal.     Breath sounds: Normal breath sounds.  Skin:    General: Skin is warm and dry.  Neurological:     Mental  Status: She is alert and oriented to person, place, and time.  Psychiatric:        Mood and Affect: Mood normal.        Behavior: Behavior normal.      ASSESSMENT & PLAN: A total of 44 minutes was spent with the patient and counseling/coordination of care regarding preparing for this visit, review of most recent office visit notes, review of multiple chronic medical conditions and their management, cardiovascular risks associated with hypertension and obesity, review of all medications, review of most recent bloodwork results, review of health maintenance items, education on nutrition, prognosis, documentation, and need for follow up.   Problem List Items Addressed This Visit       Cardiovascular and Mediastinum   HTN (hypertension) - Primary   Well-controlled hypertension Continue lisinopril 40 mg and Tenoretic 50-25 mg daily Cardiovascular risks associated with hypertension discussed Diet and nutrition discussed Blood work done today Follow-up in 6 months      Relevant Orders   Comprehensive metabolic panel   CBC with Differential/Platelet   Hemoglobin A1c   Lipid panel     Genitourinary   Stage 3b chronic kidney disease (HCC)   Chronic stable condition Continues Farxiga 10 mg daily and lisinopril 40 mg daily Advised to stay well-hydrated and avoid NSAIDs      Relevant Orders   Comprehensive metabolic panel   CBC with Differential/Platelet   Hemoglobin A1c   Lipid panel     Other   Pre-diabetes   Diet and nutrition discussed Hemoglobin A1c done  today Advised to decrease amount of daily carbohydrate intake and daily calories and increase amount of plant-based protein in her diet      Relevant Orders   Comprehensive metabolic panel   CBC with Differential/Platelet   Hemoglobin A1c   Lipid panel   Morbid obesity (HCC)   Diet and nutrition discussed Cardiovascular risks associated with obesity discussed Advised to decrease amount of daily carbohydrate intake and daily calories and increase amount of plant-based protein in her diet      Relevant Orders   Comprehensive metabolic panel   CBC with Differential/Platelet   Hemoglobin A1c   Lipid panel   Patient Instructions  Hypertension, Adult High blood pressure (hypertension) is when the force of blood pumping through the arteries is too strong. The arteries are the blood vessels that carry blood from the heart throughout the body. Hypertension forces the heart to work harder to pump blood and may cause arteries to become narrow or stiff. Untreated or uncontrolled hypertension can lead to a heart attack, heart failure, a stroke, kidney disease, and other problems. A blood pressure reading consists of a higher number over a lower number. Ideally, your blood pressure should be below 120/80. The first ("top") number is called the systolic pressure. It is a measure of the pressure in your arteries as your heart beats. The second ("bottom") number is called the diastolic pressure. It is a measure of the pressure in your arteries as the heart relaxes. What are the causes? The exact cause of this condition is not known. There are some conditions that result in high blood pressure. What increases the risk? Certain factors may make you more likely to develop high blood pressure. Some of these risk factors are under your control, including: Smoking. Not getting enough exercise or physical activity. Being overweight. Having too much fat, sugar, calories, or salt (sodium) in your  diet. Drinking too much alcohol. Other risk factors include: Having  a personal history of heart disease, diabetes, high cholesterol, or kidney disease. Stress. Having a family history of high blood pressure and high cholesterol. Having obstructive sleep apnea. Age. The risk increases with age. What are the signs or symptoms? High blood pressure may not cause symptoms. Very high blood pressure (hypertensive crisis) may cause: Headache. Fast or irregular heartbeats (palpitations). Shortness of breath. Nosebleed. Nausea and vomiting. Vision changes. Severe chest pain, dizziness, and seizures. How is this diagnosed? This condition is diagnosed by measuring your blood pressure while you are seated, with your arm resting on a flat surface, your legs uncrossed, and your feet flat on the floor. The cuff of the blood pressure monitor will be placed directly against the skin of your upper arm at the level of your heart. Blood pressure should be measured at least twice using the same arm. Certain conditions can cause a difference in blood pressure between your right and left arms. If you have a high blood pressure reading during one visit or you have normal blood pressure with other risk factors, you may be asked to: Return on a different day to have your blood pressure checked again. Monitor your blood pressure at home for 1 week or longer. If you are diagnosed with hypertension, you may have other blood or imaging tests to help your health care provider understand your overall risk for other conditions. How is this treated? This condition is treated by making healthy lifestyle changes, such as eating healthy foods, exercising more, and reducing your alcohol intake. You may be referred for counseling on a healthy diet and physical activity. Your health care provider may prescribe medicine if lifestyle changes are not enough to get your blood pressure under control and if: Your systolic blood pressure  is above 130. Your diastolic blood pressure is above 80. Your personal target blood pressure may vary depending on your medical conditions, your age, and other factors. Follow these instructions at home: Eating and drinking  Eat a diet that is high in fiber and potassium, and low in sodium, added sugar, and fat. An example of this eating plan is called the DASH diet. DASH stands for Dietary Approaches to Stop Hypertension. To eat this way: Eat plenty of fresh fruits and vegetables. Try to fill one half of your plate at each meal with fruits and vegetables. Eat whole grains, such as whole-wheat pasta, brown rice, or whole-grain bread. Fill about one fourth of your plate with whole grains. Eat or drink low-fat dairy products, such as skim milk or low-fat yogurt. Avoid fatty cuts of meat, processed or cured meats, and poultry with skin. Fill about one fourth of your plate with lean proteins, such as fish, chicken without skin, beans, eggs, or tofu. Avoid pre-made and processed foods. These tend to be higher in sodium, added sugar, and fat. Reduce your daily sodium intake. Many people with hypertension should eat less than 1,500 mg of sodium a day. Do not drink alcohol if: Your health care provider tells you not to drink. You are pregnant, may be pregnant, or are planning to become pregnant. If you drink alcohol: Limit how much you have to: 0-1 drink a day for women. 0-2 drinks a day for men. Know how much alcohol is in your drink. In the [place] one drink equals one 12 oz bottle of beer (355 mL), one 5 oz glass of wine (148 mL), or one 1 oz glass of hard liquor (44 mL). Lifestyle  Work with your health care  provider to maintain a healthy body weight or to lose weight. Ask what an ideal weight is for you. Get at least 30 minutes of exercise that causes your heart to beat faster (aerobic exercise) most days of the week. Activities may include walking, swimming, or biking. Include exercise to  strengthen your muscles (resistance exercise), such as Pilates or lifting weights, as part of your weekly exercise routine. Try to do these types of exercises for 30 minutes at least 3 days a week. Do not use any products that contain nicotine or tobacco. These products include cigarettes, chewing tobacco, and vaping devices, such as e-cigarettes. If you need help quitting, ask your health care provider. Monitor your blood pressure at home as told by your health care provider. Keep all follow-up visits. This is important. Medicines Take over-the-counter and prescription medicines only as told by your health care provider. Follow directions carefully. Blood pressure medicines must be taken as prescribed. Do not skip doses of blood pressure medicine. Doing this puts you at risk for problems and can make the medicine less effective. Ask your health care provider about side effects or reactions to medicines that you should watch for. Contact a health care provider if you: Think you are having a reaction to a medicine you are taking. Have headaches that keep coming back (recurring). Feel dizzy. Have swelling in your ankles. Have trouble with your vision. Get help right away if you: Develop a severe headache or confusion. Have unusual weakness or numbness. Feel faint. Have severe pain in your chest or abdomen. Vomit repeatedly. Have trouble breathing. These symptoms may be an emergency. Get help right away. Call 911. Do not wait to see if the symptoms will go away. Do not drive yourself to the hospital. Summary Hypertension is when the force of blood pumping through your arteries is too strong. If this condition is not controlled, it may put you at risk for serious complications. Your personal target blood pressure may vary depending on your medical conditions, your age, and other factors. For most people, a normal blood pressure is less than 120/80. Hypertension is treated with lifestyle  changes, medicines, or a combination of both. Lifestyle changes include losing weight, eating a healthy, low-sodium diet, exercising more, and limiting alcohol. This information is not intended to replace advice given to you by your health care provider. Make sure you discuss any questions you have with your health care provider. Document Revised: 06/18/2021 Document Reviewed: 06/18/2021 Elsevier Patient Education  2024 Elsevier Inc.     Edwina Barth, MD [facility]

## 2023-10-22 NOTE — Assessment & Plan Note (Signed)
 Diet and nutrition discussed Cardiovascular risks associated with obesity discussed Advised to decrease amount of daily carbohydrate intake and daily calories and increase amount of plant-based protein in her diet

## 2023-10-22 NOTE — Assessment & Plan Note (Signed)
 Chronic stable condition Continues Farxiga 10 mg daily and lisinopril 40 mg daily Advised to stay well-hydrated and avoid NSAIDs

## 2023-10-22 NOTE — Patient Instructions (Signed)
 Hypertension, Adult High blood pressure (hypertension) is when the force of blood pumping through the arteries is too strong. The arteries are the blood vessels that carry blood from the heart throughout the body. Hypertension forces the heart to work harder to pump blood and may cause arteries to become narrow or stiff. Untreated or uncontrolled hypertension can lead to a heart attack, heart failure, a stroke, kidney disease, and other problems. A blood pressure reading consists of a higher number over a lower number. Ideally, your blood pressure should be below 120/80. The first ("top") number is called the systolic pressure. It is a measure of the pressure in your arteries as your heart beats. The second ("bottom") number is called the diastolic pressure. It is a measure of the pressure in your arteries as the heart relaxes. What are the causes? The exact cause of this condition is not known. There are some conditions that result in high blood pressure. What increases the risk? Certain factors may make you more likely to develop high blood pressure. Some of these risk factors are under your control, including: Smoking. Not getting enough exercise or physical activity. Being overweight. Having too much fat, sugar, calories, or salt (sodium) in your diet. Drinking too much alcohol. Other risk factors include: Having a personal history of heart disease, diabetes, high cholesterol, or kidney disease. Stress. Having a family history of high blood pressure and high cholesterol. Having obstructive sleep apnea. Age. The risk increases with age. What are the signs or symptoms? High blood pressure may not cause symptoms. Very high blood pressure (hypertensive crisis) may cause: Headache. Fast or irregular heartbeats (palpitations). Shortness of breath. Nosebleed. Nausea and vomiting. Vision changes. Severe chest pain, dizziness, and seizures. How is this diagnosed? This condition is diagnosed by  measuring your blood pressure while you are seated, with your arm resting on a flat surface, your legs uncrossed, and your feet flat on the floor. The cuff of the blood pressure monitor will be placed directly against the skin of your upper arm at the level of your heart. Blood pressure should be measured at least twice using the same arm. Certain conditions can cause a difference in blood pressure between your right and left arms. If you have a high blood pressure reading during one visit or you have normal blood pressure with other risk factors, you may be asked to: Return on a different day to have your blood pressure checked again. Monitor your blood pressure at home for 1 week or longer. If you are diagnosed with hypertension, you may have other blood or imaging tests to help your health care provider understand your overall risk for other conditions. How is this treated? This condition is treated by making healthy lifestyle changes, such as eating healthy foods, exercising more, and reducing your alcohol intake. You may be referred for counseling on a healthy diet and physical activity. Your health care provider may prescribe medicine if lifestyle changes are not enough to get your blood pressure under control and if: Your systolic blood pressure is above 130. Your diastolic blood pressure is above 80. Your personal target blood pressure may vary depending on your medical conditions, your age, and other factors. Follow these instructions at home: Eating and drinking  Eat a diet that is high in fiber and potassium, and low in sodium, added sugar, and fat. An example of this eating plan is called the DASH diet. DASH stands for Dietary Approaches to Stop Hypertension. To eat this way: Eat  plenty of fresh fruits and vegetables. Try to fill one half of your plate at each meal with fruits and vegetables. Eat whole grains, such as whole-wheat pasta, brown rice, or whole-grain bread. Fill about one  fourth of your plate with whole grains. Eat or drink low-fat dairy products, such as skim milk or low-fat yogurt. Avoid fatty cuts of meat, processed or cured meats, and poultry with skin. Fill about one fourth of your plate with lean proteins, such as fish, chicken without skin, beans, eggs, or tofu. Avoid pre-made and processed foods. These tend to be higher in sodium, added sugar, and fat. Reduce your daily sodium intake. Many people with hypertension should eat less than 1,500 mg of sodium a day. Do not drink alcohol if: Your health care provider tells you not to drink. You are pregnant, may be pregnant, or are planning to become pregnant. If you drink alcohol: Limit how much you have to: 0-1 drink a day for women. 0-2 drinks a day for men. Know how much alcohol is in your drink. In the [place] one drink equals one 12 oz bottle of beer (355 mL), one 5 oz glass of wine (148 mL), or one 1 oz glass of hard liquor (44 mL). Lifestyle  Work with your health care provider to maintain a healthy body weight or to lose weight. Ask what an ideal weight is for you. Get at least 30 minutes of exercise that causes your heart to beat faster (aerobic exercise) most days of the week. Activities may include walking, swimming, or biking. Include exercise to strengthen your muscles (resistance exercise), such as Pilates or lifting weights, as part of your weekly exercise routine. Try to do these types of exercises for 30 minutes at least 3 days a week. Do not use any products that contain nicotine or tobacco. These products include cigarettes, chewing tobacco, and vaping devices, such as e-cigarettes. If you need help quitting, ask your health care provider. Monitor your blood pressure at home as told by your health care provider. Keep all follow-up visits. This is important. Medicines Take over-the-counter and prescription medicines only as told by your health care provider. Follow directions carefully. Blood  pressure medicines must be taken as prescribed. Do not skip doses of blood pressure medicine. Doing this puts you at risk for problems and can make the medicine less effective. Ask your health care provider about side effects or reactions to medicines that you should watch for. Contact a health care provider if you: Think you are having a reaction to a medicine you are taking. Have headaches that keep coming back (recurring). Feel dizzy. Have swelling in your ankles. Have trouble with your vision. Get help right away if you: Develop a severe headache or confusion. Have unusual weakness or numbness. Feel faint. Have severe pain in your chest or abdomen. Vomit repeatedly. Have trouble breathing. These symptoms may be an emergency. Get help right away. Call 911. Do not wait to see if the symptoms will go away. Do not drive yourself to the hospital. Summary Hypertension is when the force of blood pumping through your arteries is too strong. If this condition is not controlled, it may put you at risk for serious complications. Your personal target blood pressure may vary depending on your medical conditions, your age, and other factors. For most people, a normal blood pressure is less than 120/80. Hypertension is treated with lifestyle changes, medicines, or a combination of both. Lifestyle changes include losing weight, eating a healthy,  low-sodium diet, exercising more, and limiting alcohol. This information is not intended to replace advice given to you by your health care provider. Make sure you discuss any questions you have with your health care provider. Document Revised: 06/18/2021 Document Reviewed: 06/18/2021 Elsevier Patient Education  2024 [employer].

## 2023-10-22 NOTE — Assessment & Plan Note (Signed)
 Well-controlled hypertension Continue lisinopril 40 mg and Tenoretic 50-25 mg daily Cardiovascular risks associated with hypertension discussed Diet and nutrition discussed Blood work done today Follow-up in 6 months

## 2023-10-27 DIAGNOSIS — Z1231 Encounter for screening mammogram for malignant neoplasm of breast: Secondary | ICD-10-CM | POA: Diagnosis not present

## 2023-10-27 LAB — HM MAMMOGRAPHY

## 2023-10-28 ENCOUNTER — Encounter: Payer: Self-pay | Admitting: Emergency Medicine

## 2023-12-02 ENCOUNTER — Other Ambulatory Visit: Payer: Self-pay | Admitting: Emergency Medicine

## 2023-12-02 DIAGNOSIS — Z8739 Personal history of other diseases of the musculoskeletal system and connective tissue: Secondary | ICD-10-CM

## 2023-12-03 DIAGNOSIS — R922 Inconclusive mammogram: Secondary | ICD-10-CM | POA: Diagnosis not present

## 2023-12-15 ENCOUNTER — Other Ambulatory Visit: Payer: Self-pay | Admitting: Radiology

## 2023-12-15 DIAGNOSIS — N6011 Diffuse cystic mastopathy of right breast: Secondary | ICD-10-CM | POA: Diagnosis not present

## 2023-12-15 DIAGNOSIS — N6331 Unspecified lump in axillary tail of the right breast: Secondary | ICD-10-CM | POA: Diagnosis not present

## 2023-12-16 LAB — SURGICAL PATHOLOGY

## 2024-03-23 DIAGNOSIS — N2581 Secondary hyperparathyroidism of renal origin: Secondary | ICD-10-CM | POA: Diagnosis not present

## 2024-03-23 DIAGNOSIS — Z6841 Body Mass Index (BMI) 40.0 and over, adult: Secondary | ICD-10-CM | POA: Diagnosis not present

## 2024-03-23 DIAGNOSIS — E119 Type 2 diabetes mellitus without complications: Secondary | ICD-10-CM | POA: Diagnosis not present

## 2024-03-23 DIAGNOSIS — N1832 Chronic kidney disease, stage 3b: Secondary | ICD-10-CM | POA: Diagnosis not present

## 2024-03-23 DIAGNOSIS — Z008 Encounter for other general examination: Secondary | ICD-10-CM | POA: Diagnosis not present

## 2024-03-23 DIAGNOSIS — R2681 Unsteadiness on feet: Secondary | ICD-10-CM | POA: Diagnosis not present

## 2024-03-23 DIAGNOSIS — I129 Hypertensive chronic kidney disease with stage 1 through stage 4 chronic kidney disease, or unspecified chronic kidney disease: Secondary | ICD-10-CM | POA: Diagnosis not present

## 2024-04-20 ENCOUNTER — Other Ambulatory Visit: Payer: Self-pay | Admitting: Emergency Medicine

## 2024-04-20 DIAGNOSIS — I10 Essential (primary) hypertension: Secondary | ICD-10-CM

## 2024-06-13 DIAGNOSIS — R928 Other abnormal and inconclusive findings on diagnostic imaging of breast: Secondary | ICD-10-CM | POA: Diagnosis not present

## 2024-06-13 DIAGNOSIS — N6489 Other specified disorders of breast: Secondary | ICD-10-CM | POA: Diagnosis not present

## 2024-06-13 LAB — HM MAMMOGRAPHY

## 2024-06-24 ENCOUNTER — Encounter: Payer: Self-pay | Admitting: Emergency Medicine

## 2024-07-04 ENCOUNTER — Encounter: Payer: Self-pay | Admitting: Emergency Medicine

## 2024-07-04 ENCOUNTER — Ambulatory Visit (INDEPENDENT_AMBULATORY_CARE_PROVIDER_SITE_OTHER): Admitting: Emergency Medicine

## 2024-07-04 VITALS — BP 128/70 | HR 97 | Temp 97.6°F | Ht 61.0 in | Wt 244.0 lb

## 2024-07-04 DIAGNOSIS — Z23 Encounter for immunization: Secondary | ICD-10-CM | POA: Diagnosis not present

## 2024-07-04 DIAGNOSIS — I10 Essential (primary) hypertension: Secondary | ICD-10-CM | POA: Diagnosis not present

## 2024-07-04 DIAGNOSIS — R7303 Prediabetes: Secondary | ICD-10-CM

## 2024-07-04 DIAGNOSIS — N1832 Chronic kidney disease, stage 3b: Secondary | ICD-10-CM | POA: Diagnosis not present

## 2024-07-04 DIAGNOSIS — R220 Localized swelling, mass and lump, head: Secondary | ICD-10-CM

## 2024-07-04 LAB — POCT GLYCOSYLATED HEMOGLOBIN (HGB A1C): HbA1c POC (<> result, manual entry): 6.2 % (ref 4.0–5.6)

## 2024-07-04 NOTE — Assessment & Plan Note (Signed)
 Well-controlled hypertension Continue lisinopril  40 mg and Tenoretic  50-25 mg daily Cardiovascular risks associated with hypertension discussed Diet and nutrition discussed Follow-up in 6 months

## 2024-07-04 NOTE — Assessment & Plan Note (Signed)
 Diet and nutrition discussed Cardiovascular risks associated with obesity discussed Advised to decrease amount of daily carbohydrate intake and daily calories and increase amount of plant-based protein in her diet

## 2024-07-04 NOTE — Patient Instructions (Signed)
 Health Maintenance After Age 76 After age 27, you are at a higher risk for certain long-term diseases and infections as well as injuries from falls. Falls are a major cause of broken bones and head injuries in people who are older than age 73. Getting regular preventive care can help to keep you healthy and well. Preventive care includes getting regular testing and making lifestyle changes as recommended by your health care provider. Talk with your health care provider about: Which screenings and tests you should have. A screening is a test that checks for a disease when you have no symptoms. A diet and exercise plan that is right for you. What should I know about screenings and tests to prevent falls? Screening and testing are the best ways to find a health problem early. Early diagnosis and treatment give you the best chance of managing medical conditions that are common after age 90. Certain conditions and lifestyle choices may make you more likely to have a fall. Your health care provider may recommend: Regular vision checks. Poor vision and conditions such as cataracts can make you more likely to have a fall. If you wear glasses, make sure to get your prescription updated if your vision changes. Medicine review. Work with your health care provider to regularly review all of the medicines you are taking, including over-the-counter medicines. Ask your health care provider about any side effects that may make you more likely to have a fall. Tell your health care provider if any medicines that you take make you feel dizzy or sleepy. Strength and balance checks. Your health care provider may recommend certain tests to check your strength and balance while standing, walking, or changing positions. Foot health exam. Foot pain and numbness, as well as not wearing proper footwear, can make you more likely to have a fall. Screenings, including: Osteoporosis screening. Osteoporosis is a condition that causes  the bones to get weaker and break more easily. Blood pressure screening. Blood pressure changes and medicines to control blood pressure can make you feel dizzy. Depression screening. You may be more likely to have a fall if you have a fear of falling, feel depressed, or feel unable to do activities that you used to do. Alcohol  use screening. Using too much alcohol  can affect your balance and may make you more likely to have a fall. Follow these instructions at home: Lifestyle Do not drink alcohol  if: Your health care provider tells you not to drink. If you drink alcohol : Limit how much you have to: 0-1 drink a day for women. 0-2 drinks a day for men. Know how much alcohol  is in your drink. In the [place] one drink equals one 12 oz bottle of beer (355 mL), one 5 oz glass of wine (148 mL), or one 1 oz glass of hard liquor (44 mL). Do not use any products that contain nicotine or tobacco. These products include cigarettes, chewing tobacco, and vaping devices, such as e-cigarettes. If you need help quitting, ask your health care provider. Activity  Follow a regular exercise program to stay fit. This will help you maintain your balance. Ask your health care provider what types of exercise are appropriate for you. If you need a cane or walker, use it as recommended by your health care provider. Wear supportive shoes that have nonskid soles. Safety  Remove any tripping hazards, such as rugs, cords, and clutter. Install safety equipment such as grab bars in bathrooms and safety rails on stairs. Keep rooms and walkways  well-lit. General instructions Talk with your health care provider about your risks for falling. Tell your health care provider if: You fall. Be sure to tell your health care provider about all falls, even ones that seem minor. You feel dizzy, tiredness (fatigue), or off-balance. Take over-the-counter and prescription medicines only as told by your health care provider. These include  supplements. Eat a healthy diet and maintain a healthy weight. A healthy diet includes low-fat dairy products, low-fat (lean) meats, and fiber from whole grains, beans, and lots of fruits and vegetables. Stay current with your vaccines. Schedule regular health, dental, and eye exams. Summary Having a healthy lifestyle and getting preventive care can help to protect your health and wellness after age 15. Screening and testing are the best way to find a health problem early and help you avoid having a fall. Early diagnosis and treatment give you the best chance for managing medical conditions that are more common for people who are older than age 42. Falls are a major cause of broken bones and head injuries in people who are older than age 64. Take precautions to prevent a fall at home. Work with your health care provider to learn what changes you can make to improve your health and wellness and to prevent falls. This information is not intended to replace advice given to you by your health care provider. Make sure you discuss any questions you have with your health care provider. Document Revised: 12/31/2020 Document Reviewed: 12/31/2020 Elsevier Patient Education  2024 [employer].

## 2024-07-04 NOTE — Progress Notes (Signed)
 Miranda [name] 78 y.o.   Chief Complaint  Patient presents with   Facial Swelling    Along the jaw line on the left side, pt states that also her left ear feels like there is wind blowing in it    HISTORY OF PRESENT ILLNESS: This is a 78 y.o. female here for follow-up of chronic medical conditions Also complaining of left-sided facial swelling for couple days which is now much improved Has feeling of air gushing into left ear for couple days No other complaints or medical concerns today.  HPI   Prior to Admission medications   Medication Sig Start Date End Date Taking? Authorizing Provider  albuterol  (VENTOLIN  HFA) 108 (90 Base) MCG/ACT inhaler Inhale 2 puffs into the lungs every 4 (four) hours as needed for wheezing or shortness of breath (cough, shortness of breath or wheezing.). 10/14/21   Purcell Emil Schanz, MD  allopurinol  (ZYLOPRIM ) 100 MG tablet TAKE1/2 TABLET BY MOUTH DAILY 12/02/23   Imogine Carvell Jose, MD  atenolol -chlorthalidone  (TENORETIC ) 50-25 MG tablet TAKE 1 TABLET BY MOUTH EVERY DAY 04/20/24   Advik Weatherspoon, Emil Schanz, MD  FARXIGA 10 MG TABS tablet TAKE 1 TABLET BY MOUTH DAILY Oral for 90 Days    [provider]  lisinopril  (ZESTRIL ) 40 MG tablet TAKE 1 TABLET(40 MG) BY MOUTH DAILY 07/26/23   Purcell Emil Schanz, MD  meclizine  (ANTIVERT ) 25 MG tablet TAKE 1 TABLET(25 MG) BY MOUTH THREE TIMES DAILY AS NEEDED FOR DIZZINESS 01/22/21   Purcell Emil Schanz, MD    No Known Allergies  Patient Active Problem List   Diagnosis Date Noted   History of gout 10/14/2021   Morbid obesity (HCC) 07/09/2020   Stage 3b chronic kidney disease (HCC) 07/09/2020   Environmental allergies 03/27/2018   BMI 45.0-49.9, adult (HCC) 03/14/2016   Pre-diabetes 02/28/2015   HTN (hypertension) 01/17/2012    Past Medical History:  Diagnosis Date   Allergy    Arthritis    GERD (gastroesophageal reflux disease)    Hypertension    Sleep apnea     Past Surgical History:   Procedure Laterality Date   ABDOMINAL HYSTERECTOMY     breast biopsy Left    benign   CHOLECYSTECTOMY      Social History   Socioeconomic History   Marital status: Widowed    Spouse name: Marsa   Number of children: 2   Years of education: HS   Highest education level: Not on file  Occupational History   Occupation: Deli Counter    Comment: [employer]  Tobacco Use   Smoking status: Never   Smokeless tobacco: Never  Vaping Use   Vaping status: Never Used  Substance and Sexual Activity   Alcohol use: Yes    Alcohol/week: 0.0 standard drinks of alcohol    Comment: very seldom   Drug use: No   Sexual activity: Yes  Other Topics Concern   Not on file  Social History Narrative   1-2 sodas a day, daily consumption of tea   Lives with her  youngest daughter.   Social Drivers of Corporate Investment Banker Strain: Low Risk  (10/19/2023)   Overall Financial Resource Strain (CARDIA)    Difficulty of Paying Living Expenses: Not very hard  Food Insecurity: No Food Insecurity (10/19/2023)   Hunger Vital Sign    Worried About Running Out of Food in the Last Year: Never true    Ran Out of Food in the Last Year: Never true  Transportation Needs:  No Transportation Needs (10/19/2023)   PRAPARE - Administrator, Civil Service (Medical): No    Lack of Transportation (Non-Medical): No  Physical Activity: Inactive (10/19/2023)   Exercise Vital Sign    Days of Exercise per Week: 0 days    Minutes of Exercise per Session: 0 min  Stress: No Stress Concern Present (10/19/2023)   [employer] of Occupational Health - Occupational Stress Questionnaire    Feeling of Stress : Not at all  Social Connections: Moderately Isolated (10/19/2023)   Social Connection and Isolation Panel    Frequency of Communication with Friends and Family: More than three times a week    Frequency of Social Gatherings with Friends and Family: Never    Attends Religious Services: 1 to 4 times  per year    Active Member of [employer] or Organizations: No    Attends Banker Meetings: Never    Marital Status: Widowed  Intimate Partner Violence: Patient Unable To Answer (10/19/2023)   Humiliation, Afraid, Rape, and Kick questionnaire    Fear of Current or Ex-Partner: Patient unable to answer    Emotionally Abused: Patient unable to answer    Physically Abused: Patient unable to answer    Sexually Abused: Patient unable to answer    Family History  Problem Relation Age of Onset   Stroke Brother    Alzheimer's disease Brother    Asthma Daughter    Diabetes Sister    Heart disease Sister    Mental illness Sister        depression, anxiety, resolved with treatment   Graves' disease Daughter    Colon cancer Neg Hx    Esophageal cancer Neg Hx    Rectal cancer Neg Hx    Stomach cancer Neg Hx      Review of Systems  Constitutional: Negative.  Negative for chills and fever.  HENT: Negative.  Negative for congestion and sore throat.   Respiratory: Negative.  Negative for cough and shortness of breath.   Cardiovascular: Negative.  Negative for chest pain and palpitations.  Gastrointestinal:  Negative for abdominal pain, diarrhea, nausea and vomiting.  Genitourinary: Negative.  Negative for dysuria and hematuria.  Skin: Negative.  Negative for rash.  Neurological: Negative.  Negative for dizziness and headaches.  All other systems reviewed and are negative.   Vitals:   07/04/24 1341  BP: 130/84  Pulse: 97  Temp: 97.6 F (36.4 C)  SpO2: 99%    Physical Exam Vitals reviewed.  Constitutional:      Appearance: Normal appearance.  HENT:     Head: Normocephalic.     Right Ear: Tympanic membrane, ear canal and external ear normal.     Left Ear: Tympanic membrane, ear canal and external ear normal.     Mouth/Throat:     Mouth: Mucous membranes are moist.     Pharynx: Oropharynx is clear.  Eyes:     Extraocular Movements: Extraocular movements intact.      Pupils: Pupils are equal, round, and reactive to light.  Cardiovascular:     Rate and Rhythm: Normal rate and regular rhythm.     Pulses: Normal pulses.     Heart sounds: Normal heart sounds.  Pulmonary:     Effort: Pulmonary effort is normal.     Breath sounds: Normal breath sounds.  Abdominal:     Palpations: Abdomen is soft.     Tenderness: There is no abdominal tenderness.  Musculoskeletal:     Cervical back:  No tenderness.  Lymphadenopathy:     Head:     Left side of head: Submental adenopathy present.     Cervical: No cervical adenopathy.  Skin:    General: Skin is warm and dry.     Capillary Refill: Capillary refill takes less than 2 seconds.  Neurological:     General: No focal deficit present.     Mental Status: She is alert and oriented to person, place, and time.  Psychiatric:        Mood and Affect: Mood normal.        Behavior: Behavior normal.    Results for orders placed or performed in visit on 07/04/24 (from the past 24 hours)  POCT HgB A1C     Status: Abnormal   Collection Time: 07/04/24  2:10 PM  Result Value Ref Range   Hemoglobin A1C     HbA1c POC (<> result, manual entry) 6.2 4.0 - 5.6 %   HbA1c, POC (prediabetic range)     HbA1c, POC (controlled diabetic range)       ASSESSMENT & PLAN: A total of 40 minutes was spent with the patient and counseling/coordination of care regarding preparing for this visit, review of most recent office visit notes, review of multiple chronic medical conditions and their management, cardiovascular risks associated with hypertension and prediabetes, review of all medications, review of most recent bloodwork results including interpretation of today's hemoglobin A1c, review of health maintenance items, education on nutrition, prognosis, documentation, and need for follow up.   Problem List Items Addressed This Visit       Cardiovascular and Mediastinum   HTN (hypertension) - Primary   Well-controlled  hypertension Continue lisinopril  40 mg and Tenoretic  50-25 mg daily Cardiovascular risks associated with hypertension discussed Diet and nutrition discussed Follow-up in 6 months        Genitourinary   Stage 3b chronic kidney disease (HCC)   Chronic stable condition Continues Farxiga 10 mg daily and lisinopril  40 mg daily Advised to stay well-hydrated and avoid NSAIDs Has follow-up appointment with nephrologist next month        Other   Pre-diabetes   Diet and nutrition discussed Hemoglobin A1c done today at 6.2 Advised to decrease amount of daily carbohydrate intake and daily calories and increase amount of plant-based protein in her diet      Relevant Orders   POCT HgB A1C (Completed)   Morbid obesity (HCC)   Diet and nutrition discussed Cardiovascular risks associated with obesity discussed Advised to decrease amount of daily carbohydrate intake and daily calories and increase amount of plant-based protein in her diet      Facial swelling   Residual slightly tender left submental adenopathy No significant facial swelling noted on exam Differential diagnosis discussed No active infection noted      Patient Instructions  Health Maintenance After Age 38 After age 15, you are at a higher risk for certain long-term diseases and infections as well as injuries from falls. Falls are a major cause of broken bones and head injuries in people who are older than age 38. Getting regular preventive care can help to keep you healthy and well. Preventive care includes getting regular testing and making lifestyle changes as recommended by your health care provider. Talk with your health care provider about: Which screenings and tests you should have. A screening is a test that checks for a disease when you have no symptoms. A diet and exercise plan that is right for you. What  should I know about screenings and tests to prevent falls? Screening and testing are the best ways to find a  health problem early. Early diagnosis and treatment give you the best chance of managing medical conditions that are common after age 72. Certain conditions and lifestyle choices may make you more likely to have a fall. Your health care provider may recommend: Regular vision checks. Poor vision and conditions such as cataracts can make you more likely to have a fall. If you wear glasses, make sure to get your prescription updated if your vision changes. Medicine review. Work with your health care provider to regularly review all of the medicines you are taking, including over-the-counter medicines. Ask your health care provider about any side effects that may make you more likely to have a fall. Tell your health care provider if any medicines that you take make you feel dizzy or sleepy. Strength and balance checks. Your health care provider may recommend certain tests to check your strength and balance while standing, walking, or changing positions. Foot health exam. Foot pain and numbness, as well as not wearing proper footwear, can make you more likely to have a fall. Screenings, including: Osteoporosis screening. Osteoporosis is a condition that causes the bones to get weaker and break more easily. Blood pressure screening. Blood pressure changes and medicines to control blood pressure can make you feel dizzy. Depression screening. You may be more likely to have a fall if you have a fear of falling, feel depressed, or feel unable to do activities that you used to do. Alcohol use screening. Using too much alcohol can affect your balance and may make you more likely to have a fall. Follow these instructions at home: Lifestyle Do not drink alcohol if: Your health care provider tells you not to drink. If you drink alcohol: Limit how much you have to: 0-1 drink a day for women. 0-2 drinks a day for men. Know how much alcohol is in your drink. In the [place] one drink equals one 12 oz bottle of beer  (355 mL), one 5 oz glass of wine (148 mL), or one 1 oz glass of hard liquor (44 mL). Do not use any products that contain nicotine or tobacco. These products include cigarettes, chewing tobacco, and vaping devices, such as e-cigarettes. If you need help quitting, ask your health care provider. Activity  Follow a regular exercise program to stay fit. This will help you maintain your balance. Ask your health care provider what types of exercise are appropriate for you. If you need a cane or walker, use it as recommended by your health care provider. Wear supportive shoes that have nonskid soles. Safety  Remove any tripping hazards, such as rugs, cords, and clutter. Install safety equipment such as grab bars in bathrooms and safety rails on stairs. Keep rooms and walkways well-lit. General instructions Talk with your health care provider about your risks for falling. Tell your health care provider if: You fall. Be sure to tell your health care provider about all falls, even ones that seem minor. You feel dizzy, tiredness (fatigue), or off-balance. Take over-the-counter and prescription medicines only as told by your health care provider. These include supplements. Eat a healthy diet and maintain a healthy weight. A healthy diet includes low-fat dairy products, low-fat (lean) meats, and fiber from whole grains, beans, and lots of fruits and vegetables. Stay current with your vaccines. Schedule regular health, dental, and eye exams. Summary Having a healthy lifestyle and getting preventive care  can help to protect your health and wellness after age 47. Screening and testing are the best way to find a health problem early and help you avoid having a fall. Early diagnosis and treatment give you the best chance for managing medical conditions that are more common for people who are older than age 51. Falls are a major cause of broken bones and head injuries in people who are older than age 36. Take  precautions to prevent a fall at home. Work with your health care provider to learn what changes you can make to improve your health and wellness and to prevent falls. This information is not intended to replace advice given to you by your health care provider. Make sure you discuss any questions you have with your health care provider. Document Revised: 12/31/2020 Document Reviewed: 12/31/2020 Elsevier Patient Education  2024 [employer] Inc[employer]    Emil Schaumann, MD [facility]

## 2024-07-04 NOTE — Assessment & Plan Note (Signed)
 Diet and nutrition discussed Hemoglobin A1c done today at 6.2 Advised to decrease amount of daily carbohydrate intake and daily calories and increase amount of plant-based protein in her diet

## 2024-07-04 NOTE — Assessment & Plan Note (Signed)
 Residual slightly tender left submental adenopathy No significant facial swelling noted on exam Differential diagnosis discussed No active infection noted

## 2024-07-04 NOTE — Assessment & Plan Note (Signed)
 Chronic stable condition Continues Farxiga 10 mg daily and lisinopril  40 mg daily Advised to stay well-hydrated and avoid NSAIDs Has follow-up appointment with nephrologist next month

## 2024-07-19 ENCOUNTER — Other Ambulatory Visit: Payer: Self-pay | Admitting: Emergency Medicine

## 2024-07-19 DIAGNOSIS — I10 Essential (primary) hypertension: Secondary | ICD-10-CM

## 2024-08-26 ENCOUNTER — Other Ambulatory Visit: Payer: Self-pay | Admitting: Emergency Medicine

## 2024-08-26 DIAGNOSIS — Z8739 Personal history of other diseases of the musculoskeletal system and connective tissue: Secondary | ICD-10-CM

## 2024-10-19 ENCOUNTER — Ambulatory Visit: Payer: Medicare PPO
